# Patient Record
Sex: Male | Born: 1950 | Race: White | Hispanic: No | Marital: Married | Smoking: Never smoker
Health system: Southern US, Community
[De-identification: ages and names within clinical notes are randomized; demographics above are authoritative.]

## PROBLEM LIST (undated history)

## (undated) DIAGNOSIS — I219 Acute myocardial infarction, unspecified: Secondary | ICD-10-CM

## (undated) DIAGNOSIS — E785 Hyperlipidemia, unspecified: Secondary | ICD-10-CM

## (undated) DIAGNOSIS — I639 Cerebral infarction, unspecified: Secondary | ICD-10-CM

## (undated) DIAGNOSIS — E119 Type 2 diabetes mellitus without complications: Secondary | ICD-10-CM

## (undated) HISTORY — PX: CORONARY STENT INTERVENTION: CATH118234

---

## 1988-02-04 DIAGNOSIS — I219 Acute myocardial infarction, unspecified: Secondary | ICD-10-CM

## 1988-02-04 HISTORY — DX: Acute myocardial infarction, unspecified: I21.9

## 2019-02-04 DIAGNOSIS — I639 Cerebral infarction, unspecified: Secondary | ICD-10-CM

## 2019-02-04 HISTORY — DX: Cerebral infarction, unspecified: I63.9

## 2019-08-26 ENCOUNTER — Emergency Department (HOSPITAL_COMMUNITY): Payer: Medicare HMO

## 2019-08-26 ENCOUNTER — Other Ambulatory Visit: Payer: Self-pay

## 2019-08-26 ENCOUNTER — Inpatient Hospital Stay (HOSPITAL_COMMUNITY)
Admission: EM | Admit: 2019-08-26 | Discharge: 2019-09-03 | DRG: 023 | Disposition: A | Discharge status: Home Health | Payer: Medicare HMO | Attending: Internal Medicine | Admitting: Internal Medicine

## 2019-08-26 ENCOUNTER — Encounter (HOSPITAL_COMMUNITY): Payer: Self-pay

## 2019-08-26 DIAGNOSIS — E119 Type 2 diabetes mellitus without complications: Secondary | ICD-10-CM

## 2019-08-26 DIAGNOSIS — Z515 Encounter for palliative care: Secondary | ICD-10-CM

## 2019-08-26 DIAGNOSIS — I639 Cerebral infarction, unspecified: Secondary | ICD-10-CM

## 2019-08-26 DIAGNOSIS — E1165 Type 2 diabetes mellitus with hyperglycemia: Secondary | ICD-10-CM | POA: Diagnosis present

## 2019-08-26 DIAGNOSIS — K5900 Constipation, unspecified: Secondary | ICD-10-CM | POA: Diagnosis not present

## 2019-08-26 DIAGNOSIS — I63219 Cerebral infarction due to unspecified occlusion or stenosis of unspecified vertebral arteries: Secondary | ICD-10-CM

## 2019-08-26 DIAGNOSIS — Z7902 Long term (current) use of antithrombotics/antiplatelets: Secondary | ICD-10-CM

## 2019-08-26 DIAGNOSIS — I651 Occlusion and stenosis of basilar artery: Secondary | ICD-10-CM

## 2019-08-26 DIAGNOSIS — I63332 Cerebral infarction due to thrombosis of left posterior cerebral artery: Secondary | ICD-10-CM | POA: Diagnosis not present

## 2019-08-26 DIAGNOSIS — E785 Hyperlipidemia, unspecified: Secondary | ICD-10-CM | POA: Diagnosis not present

## 2019-08-26 DIAGNOSIS — R4781 Slurred speech: Secondary | ICD-10-CM | POA: Diagnosis present

## 2019-08-26 DIAGNOSIS — E118 Type 2 diabetes mellitus with unspecified complications: Secondary | ICD-10-CM

## 2019-08-26 DIAGNOSIS — E1122 Type 2 diabetes mellitus with diabetic chronic kidney disease: Secondary | ICD-10-CM | POA: Diagnosis present

## 2019-08-26 DIAGNOSIS — I63213 Cerebral infarction due to unspecified occlusion or stenosis of bilateral vertebral arteries: Secondary | ICD-10-CM | POA: Diagnosis not present

## 2019-08-26 DIAGNOSIS — I2510 Atherosclerotic heart disease of native coronary artery without angina pectoris: Secondary | ICD-10-CM | POA: Diagnosis present

## 2019-08-26 DIAGNOSIS — Z955 Presence of coronary angioplasty implant and graft: Secondary | ICD-10-CM

## 2019-08-26 DIAGNOSIS — N183 Chronic kidney disease, stage 3 unspecified: Secondary | ICD-10-CM | POA: Diagnosis present

## 2019-08-26 DIAGNOSIS — I6602 Occlusion and stenosis of left middle cerebral artery: Secondary | ICD-10-CM | POA: Diagnosis present

## 2019-08-26 DIAGNOSIS — G8321 Monoplegia of upper limb affecting right dominant side: Secondary | ICD-10-CM | POA: Diagnosis present

## 2019-08-26 DIAGNOSIS — Z79899 Other long term (current) drug therapy: Secondary | ICD-10-CM

## 2019-08-26 DIAGNOSIS — I252 Old myocardial infarction: Secondary | ICD-10-CM

## 2019-08-26 DIAGNOSIS — R29705 NIHSS score 5: Secondary | ICD-10-CM | POA: Diagnosis present

## 2019-08-26 DIAGNOSIS — N1831 Chronic kidney disease, stage 3a: Secondary | ICD-10-CM | POA: Diagnosis not present

## 2019-08-26 DIAGNOSIS — Z781 Physical restraint status: Secondary | ICD-10-CM

## 2019-08-26 DIAGNOSIS — R4182 Altered mental status, unspecified: Secondary | ICD-10-CM | POA: Diagnosis present

## 2019-08-26 DIAGNOSIS — Z6834 Body mass index (BMI) 34.0-34.9, adult: Secondary | ICD-10-CM

## 2019-08-26 DIAGNOSIS — Z7189 Other specified counseling: Secondary | ICD-10-CM

## 2019-08-26 DIAGNOSIS — E669 Obesity, unspecified: Secondary | ICD-10-CM | POA: Diagnosis present

## 2019-08-26 DIAGNOSIS — R339 Retention of urine, unspecified: Secondary | ICD-10-CM | POA: Diagnosis not present

## 2019-08-26 DIAGNOSIS — E876 Hypokalemia: Secondary | ICD-10-CM | POA: Diagnosis present

## 2019-08-26 DIAGNOSIS — G9341 Metabolic encephalopathy: Secondary | ICD-10-CM | POA: Diagnosis not present

## 2019-08-26 DIAGNOSIS — I129 Hypertensive chronic kidney disease with stage 1 through stage 4 chronic kidney disease, or unspecified chronic kidney disease: Secondary | ICD-10-CM | POA: Diagnosis present

## 2019-08-26 DIAGNOSIS — I6322 Cerebral infarction due to unspecified occlusion or stenosis of basilar arteries: Secondary | ICD-10-CM

## 2019-08-26 DIAGNOSIS — R471 Dysarthria and anarthria: Secondary | ICD-10-CM

## 2019-08-26 DIAGNOSIS — I679 Cerebrovascular disease, unspecified: Secondary | ICD-10-CM | POA: Diagnosis present

## 2019-08-26 DIAGNOSIS — I69398 Other sequelae of cerebral infarction: Secondary | ICD-10-CM

## 2019-08-26 DIAGNOSIS — Z20822 Contact with and (suspected) exposure to covid-19: Secondary | ICD-10-CM | POA: Diagnosis present

## 2019-08-26 DIAGNOSIS — R262 Difficulty in walking, not elsewhere classified: Secondary | ICD-10-CM | POA: Diagnosis present

## 2019-08-26 DIAGNOSIS — H538 Other visual disturbances: Secondary | ICD-10-CM | POA: Diagnosis present

## 2019-08-26 DIAGNOSIS — R319 Hematuria, unspecified: Secondary | ICD-10-CM | POA: Diagnosis not present

## 2019-08-26 HISTORY — DX: Hyperlipidemia, unspecified: E78.5

## 2019-08-26 HISTORY — DX: Acute myocardial infarction, unspecified: I21.9

## 2019-08-26 HISTORY — DX: Cerebral infarction, unspecified: I63.9

## 2019-08-26 LAB — DIFFERENTIAL
Abs Immature Granulocytes: 0.02 10*3/uL (ref 0.00–0.07)
Basophils Absolute: 0 10*3/uL (ref 0.0–0.1)
Basophils Relative: 0 %
Eosinophils Absolute: 0.4 10*3/uL (ref 0.0–0.5)
Eosinophils Relative: 5 %
Immature Granulocytes: 0 %
Lymphocytes Relative: 15 %
Lymphs Abs: 1.2 10*3/uL (ref 0.7–4.0)
Monocytes Absolute: 0.9 10*3/uL (ref 0.1–1.0)
Monocytes Relative: 11 %
Neutro Abs: 5.4 10*3/uL (ref 1.7–7.7)
Neutrophils Relative %: 69 %

## 2019-08-26 LAB — CBC
HCT: 40.9 % (ref 39.0–52.0)
HCT: 42.2 % (ref 39.0–52.0)
Hemoglobin: 14 g/dL (ref 13.0–17.0)
Hemoglobin: 14.4 g/dL (ref 13.0–17.0)
MCH: 32.6 pg (ref 26.0–34.0)
MCH: 32.4 pg (ref 26.0–34.0)
MCHC: 34.2 g/dL (ref 30.0–36.0)
MCHC: 34.1 g/dL (ref 30.0–36.0)
MCV: 95.1 fL (ref 80.0–100.0)
MCV: 94.8 fL (ref 80.0–100.0)
Platelets: 281 10*3/uL (ref 150–400)
Platelets: 278 10*3/uL (ref 150–400)
RBC: 4.3 MIL/uL (ref 4.22–5.81)
RBC: 4.45 MIL/uL (ref 4.22–5.81)
RDW: 13.4 % (ref 11.5–15.5)
RDW: 13.3 % (ref 11.5–15.5)
WBC: 7.9 10*3/uL (ref 4.0–10.5)
WBC: 6.9 10*3/uL (ref 4.0–10.5)
nRBC: 0 % (ref 0.0–0.2)
nRBC: 0 % (ref 0.0–0.2)

## 2019-08-26 LAB — COMPREHENSIVE METABOLIC PANEL
ALT: 22 U/L (ref 0–44)
AST: 23 U/L (ref 15–41)
Albumin: 3.4 g/dL — ABNORMAL LOW (ref 3.5–5.0)
Alkaline Phosphatase: 106 U/L (ref 38–126)
Anion gap: 10 (ref 5–15)
BUN: 16 mg/dL (ref 8–23)
CO2: 25 mmol/L (ref 22–32)
Calcium: 9.4 mg/dL (ref 8.9–10.3)
Chloride: 103 mmol/L (ref 98–111)
Creatinine, Ser: 1.56 mg/dL — ABNORMAL HIGH (ref 0.61–1.24)
GFR calc Af Amer: 52 mL/min — ABNORMAL LOW (ref >60)
GFR calc non Af Amer: 45 mL/min — ABNORMAL LOW (ref >60)
Glucose, Bld: 163 mg/dL — ABNORMAL HIGH (ref 70–99)
Potassium: 3.7 mmol/L (ref 3.5–5.1)
Sodium: 138 mmol/L (ref 135–145)
Total Bilirubin: 0.8 mg/dL (ref 0.3–1.2)
Total Protein: 7.4 g/dL (ref 6.5–8.1)

## 2019-08-26 LAB — SARS CORONAVIRUS 2 BY RT PCR (HOSPITAL ORDER, PERFORMED IN ~~LOC~~ HOSPITAL LAB): SARS Coronavirus 2: NEGATIVE

## 2019-08-26 LAB — HIV ANTIBODY (ROUTINE TESTING W REFLEX): HIV Screen 4th Generation wRfx: NONREACTIVE

## 2019-08-26 LAB — I-STAT CHEM 8, ED
BUN: 18 mg/dL (ref 8–23)
Calcium, Ion: 1.27 mmol/L (ref 1.15–1.40)
Chloride: 102 mmol/L (ref 98–111)
Creatinine, Ser: 1.6 mg/dL — ABNORMAL HIGH (ref 0.61–1.24)
Glucose, Bld: 160 mg/dL — ABNORMAL HIGH (ref 70–99)
HCT: 40 % (ref 39.0–52.0)
Hemoglobin: 13.6 g/dL (ref 13.0–17.0)
Potassium: 3.6 mmol/L (ref 3.5–5.1)
Sodium: 142 mmol/L (ref 135–145)
TCO2: 26 mmol/L (ref 22–32)

## 2019-08-26 LAB — CBG MONITORING, ED: Glucose-Capillary: 116 mg/dL — ABNORMAL HIGH (ref 70–99)

## 2019-08-26 LAB — CREATININE, SERUM
Creatinine, Ser: 1.47 mg/dL — ABNORMAL HIGH (ref 0.61–1.24)
GFR calc Af Amer: 56 mL/min — ABNORMAL LOW (ref >60)
GFR calc non Af Amer: 48 mL/min — ABNORMAL LOW (ref >60)

## 2019-08-26 LAB — APTT: aPTT: 28 seconds (ref 24–36)

## 2019-08-26 LAB — PROTIME-INR
INR: 1 (ref 0.8–1.2)
Prothrombin Time: 12.7 seconds (ref 11.4–15.2)

## 2019-08-26 IMAGING — MR MR HEAD W/O CM
7 of 11 series · 26 of 48 positions shown · non-contrast
Comparison: [DATE] head CT.  [DATE] MRI head.

CLINICAL DATA: Neuro deficit.

EXAM:
MRI HEAD WITHOUT CONTRAST
TECHNIQUE: Multiplanar, multiecho pulse sequences of the brain and surrounding
structures were obtained without intravenous contrast.

[Series 2: DWI · axial · 3.0mm · 0.98mm/px · z∈[-65,+88]mm · 7 of 104 slices shown (1 of 2)]
[im 1/104]
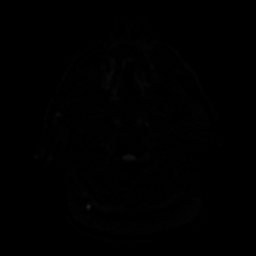
[im 18/104]
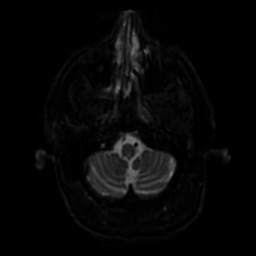
[im 35/104]
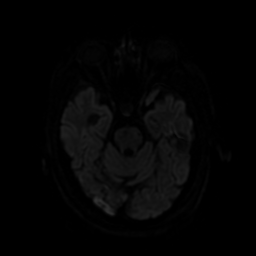
[im 52/104]
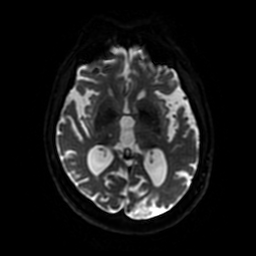
[im 69/104]
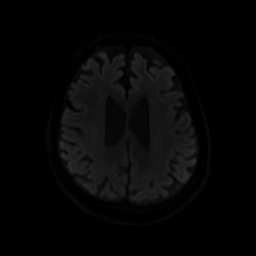
[im 86/104]
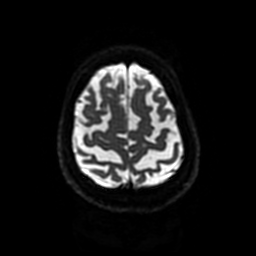
[im 104/104]
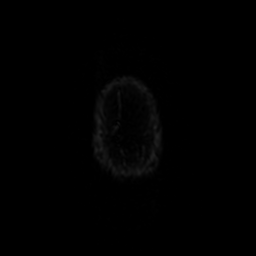

[Series 3: DWI · coronal · 4.0mm · 0.94mm/px · 6 of 74 slices shown (2 of 2)]
[im 1/74]
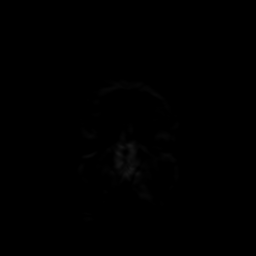
[im 15/74]
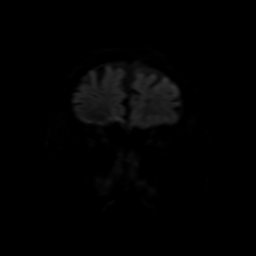
[im 30/74]
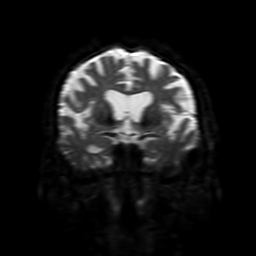
[im 44/74]
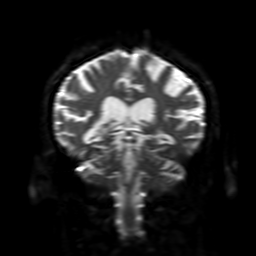
[im 59/74]
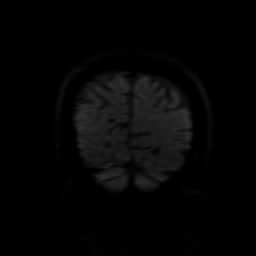
[im 74/74]
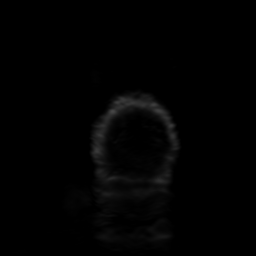

[Series 4: FLAIR · axial · 3.0mm · 0.45mm/px · z∈[-63,+86]mm · 2 of 26 slices shown (1 of 2)]
[im 1/26]
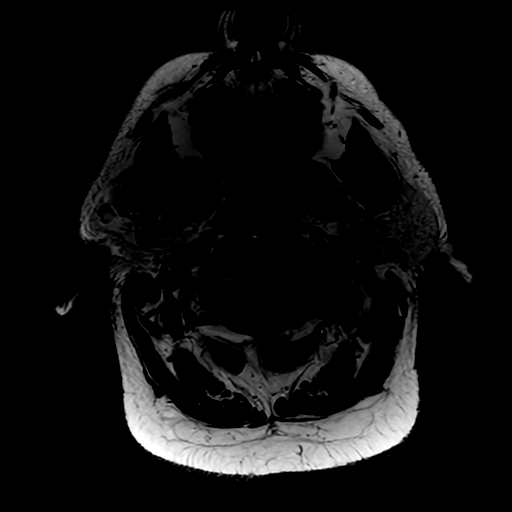
[im 26/26]
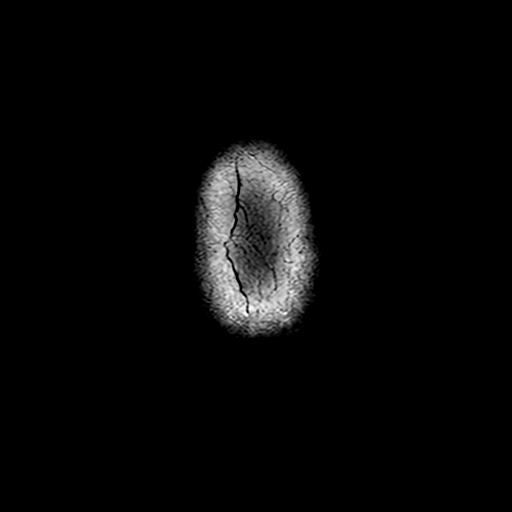

[Series 6: FLAIR · sagittal · 5.0mm · 0.23mm/px · 2 of 25 slices shown (2 of 2)]
[im 1/25]
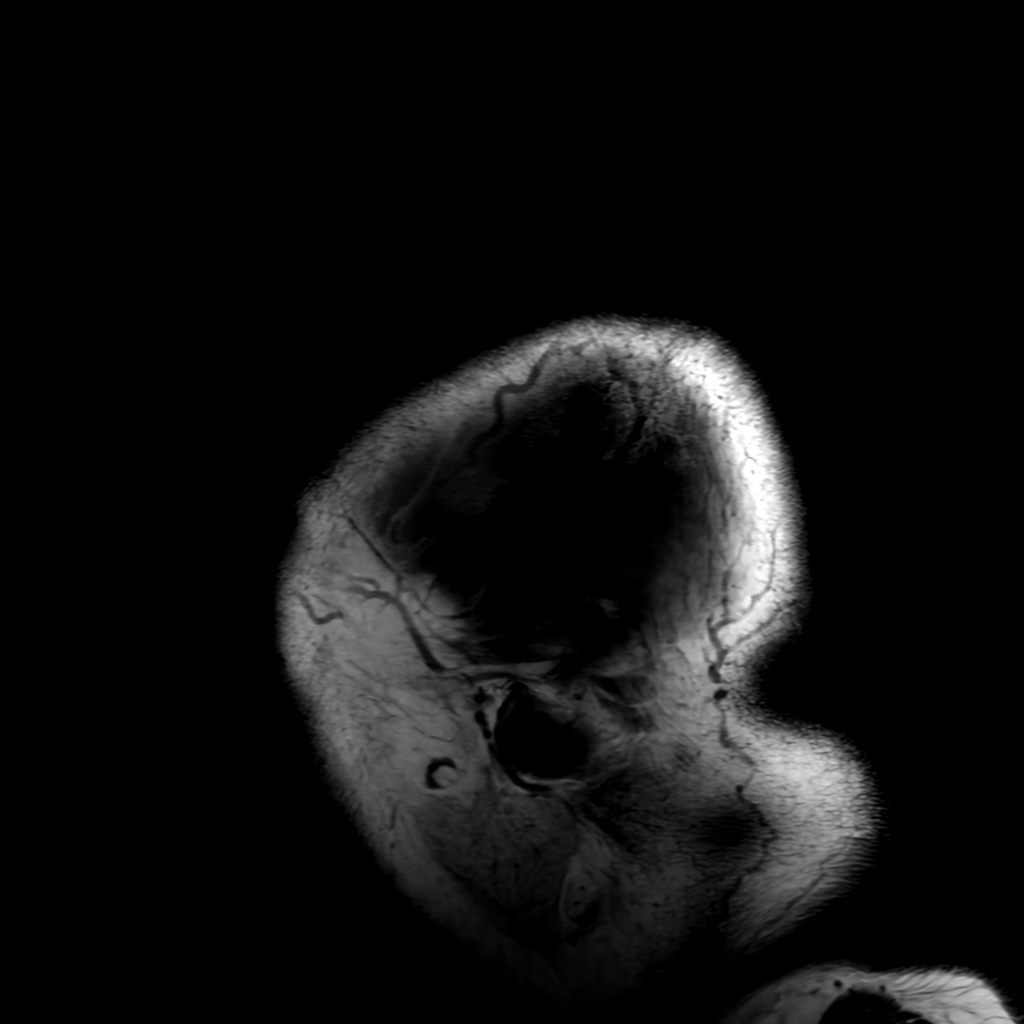
[im 25/25]
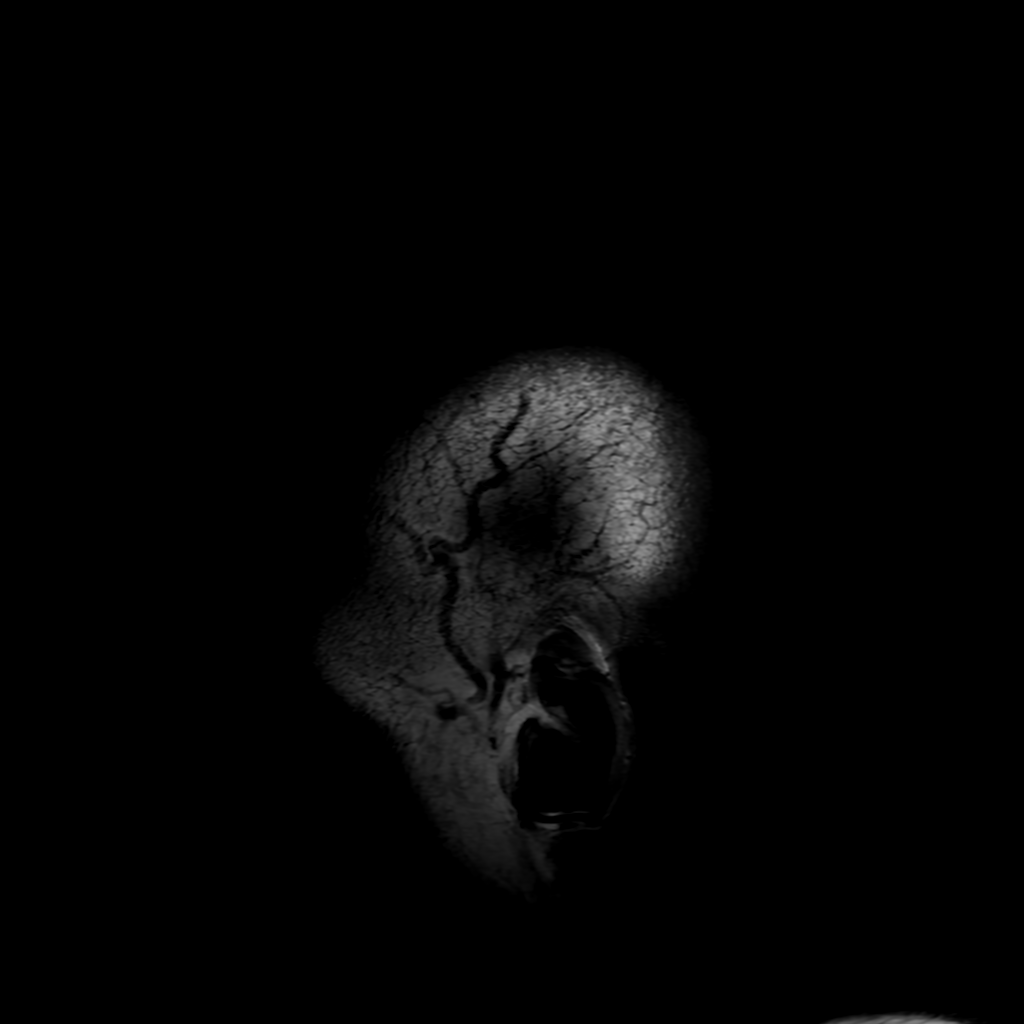

[Series 7: T2 · axial · 5.0mm · 0.47mm/px · z∈[-64,+86]mm · 2 of 26 slices shown]
[im 1/26]
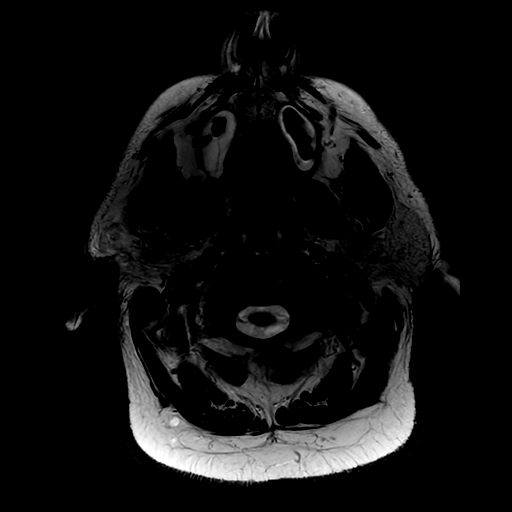
[im 26/26]
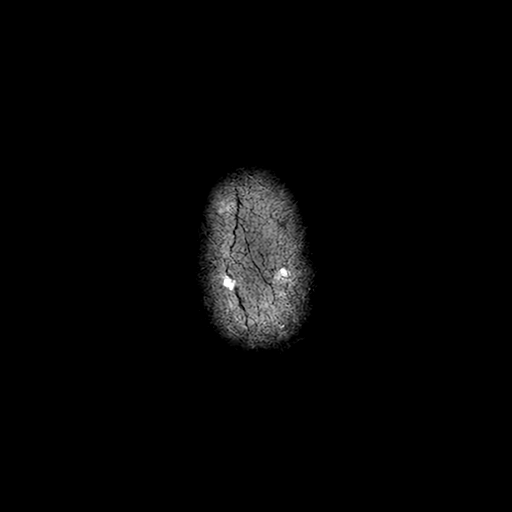

[Series 250: ADC · axial · 3.0mm · 0.98mm/px · z∈[-65,+88]mm · 4 of 52 slices shown (1 of 2)]
[im 1/52]
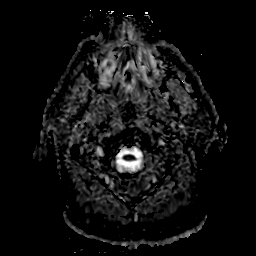
[im 18/52]
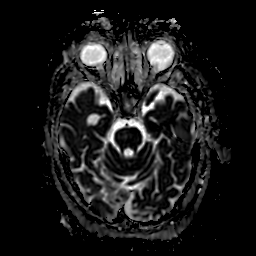
[im 35/52]
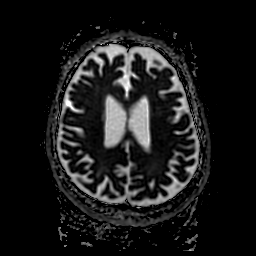
[im 52/52]
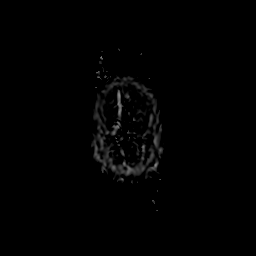

[Series 350: ADC · coronal · 4.0mm · 0.94mm/px · 3 of 37 slices shown (2 of 2)]
[im 1/37]
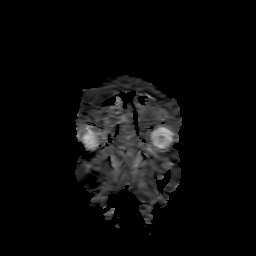
[im 19/37]
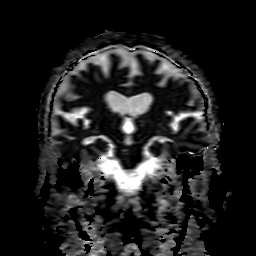
[im 37/37]
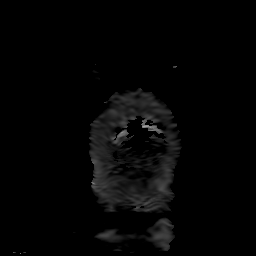

[26 of 48 positions shown; findings below may reference images not displayed]

FINDINGS: Brain: Multifocal restricted diffusion and correlative T2
hyperintense signal involving the bilateral occipital lobes and
anterior left pons. Tiny remote microhemorrhages involving the
bilateral cerebellum.

Moderate diffuse parenchymal volume loss. Sequela of remote
bilateral occipital and cerebellar infarcts. Smaller remote infarcts
are seen involving the dorsal left midbrain, bilateral centrum
semiovale, bilateral thalami and left frontal white matter.

No midline shift, mass lesion or extra-axial fluid collection.

Vascular: FLAIR hyperintense signal within the right V4 segment may
reflect slow flow versus occlusion, unchanged from prior exam.

Skull and upper cervical spine: Normal marrow signal.

Sinuses/Orbits: Normal orbits. Mild-to-moderate pansinus mucosal
thickening with layering bilateral maxillary secretions. Trace left
mastoid effusion.

Other: None.
IMPRESSION: Acute infarcts involving the bilateral occipital lobes and anterior
left pons.

Remote infarcts involving the bilateral occipital lobes, bilateral
cerebellum, dorsal left midbrain, bilateral centrum semiovale,
bilateral thalami and left frontal white matter.

Slow flow versus chronic thrombosis of the right V4 segment,
unchanged.

These results were called by telephone at the time of interpretation
on [DATE] at [DATE] to provider Dr. ABOT, who verbally
acknowledged these results.

## 2019-08-26 IMAGING — CT CT HEAD W/O CM
4 series · 16 of 47 positions shown, 18 images · non-contrast
Comparison: MRI of the brain [DATE]

CLINICAL DATA: Neuro deficit.

EXAM:
CT HEAD WITHOUT CONTRAST
TECHNIQUE: Contiguous axial images were obtained from the base of the skull
through the vertex without intravenous contrast.

[Series 3: head without · axial · non-contrast · 0.51mm/px · z∈[+1752,+1872]mm · 7 of 32 slices shown, 9 images]
[im 4/32  brain]
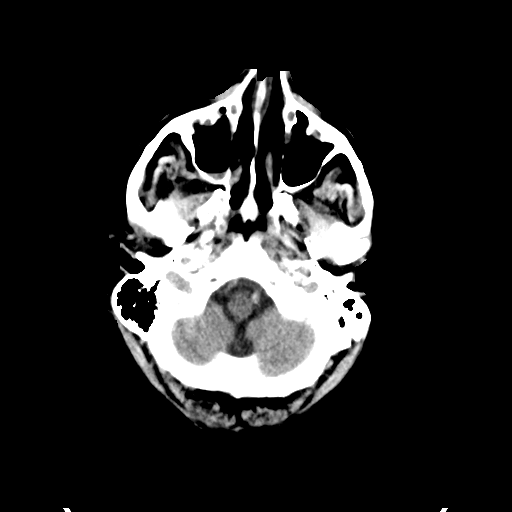
[im 4/32  bone]
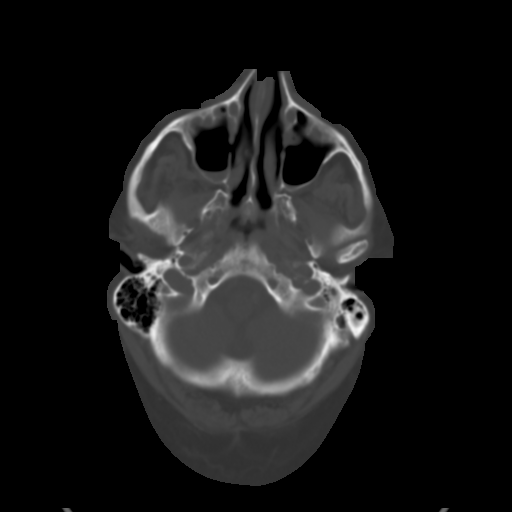
[im 8/32  brain]
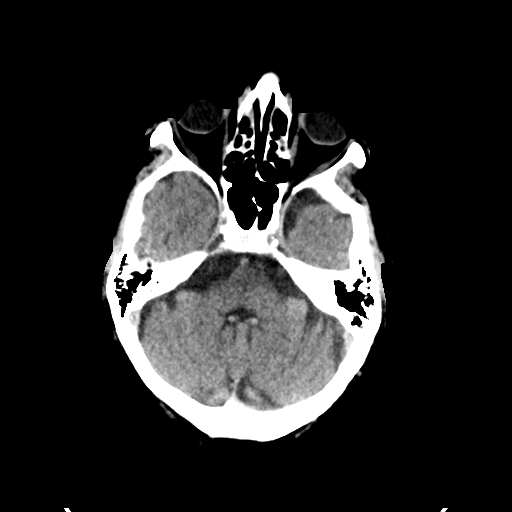
[im 12/32  brain]
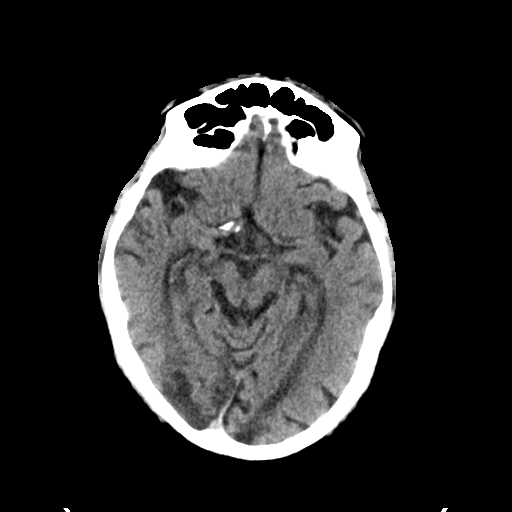
[im 16/32  brain]
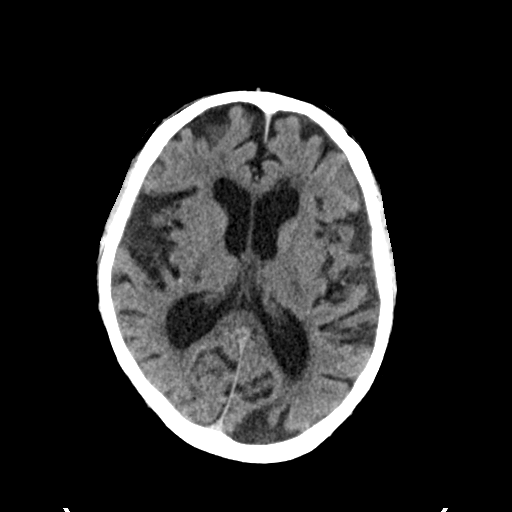
[im 20/32  brain]
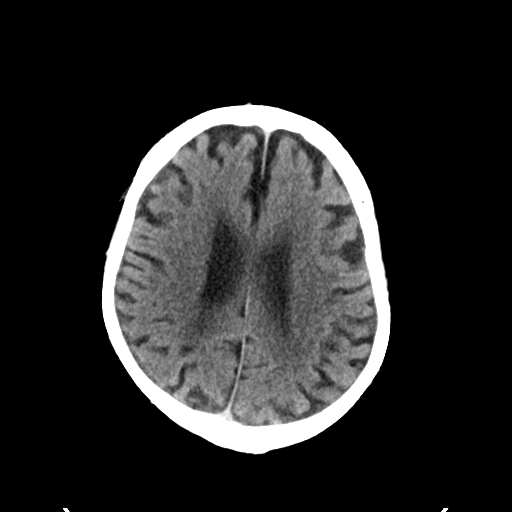
[im 20/32  bone]
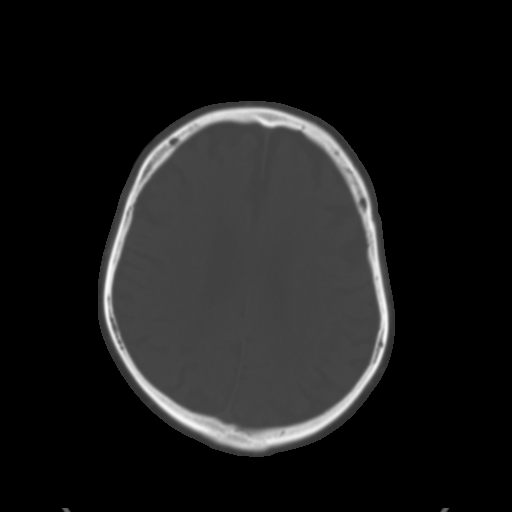
[im 24/32  brain]
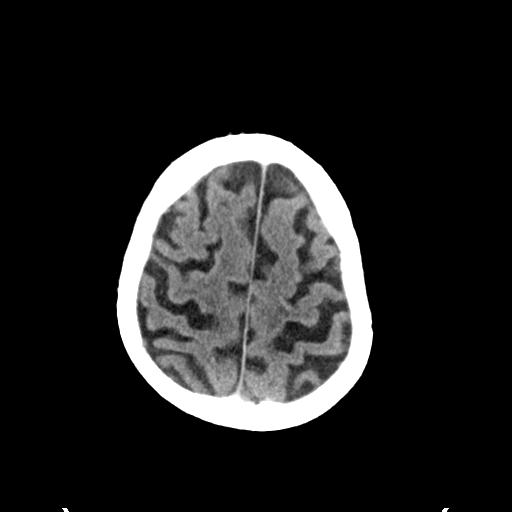
[im 28/32  brain]
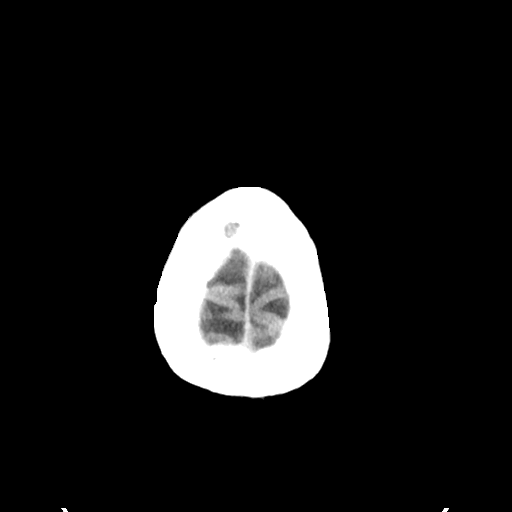

[Series 4: head bone · axial · 0.51mm/px · z∈[+1751,+1783]mm · 3 of 80 slices shown]
[im 8/80  bone]
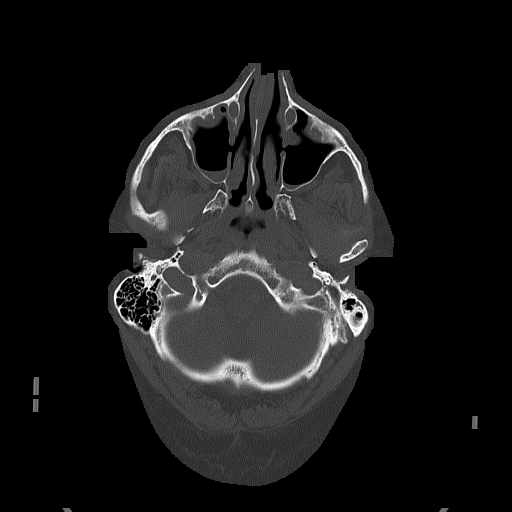
[im 16/80  bone]
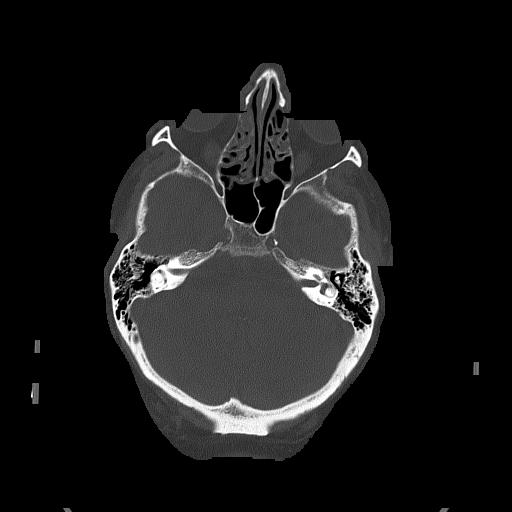
[im 24/80  bone]
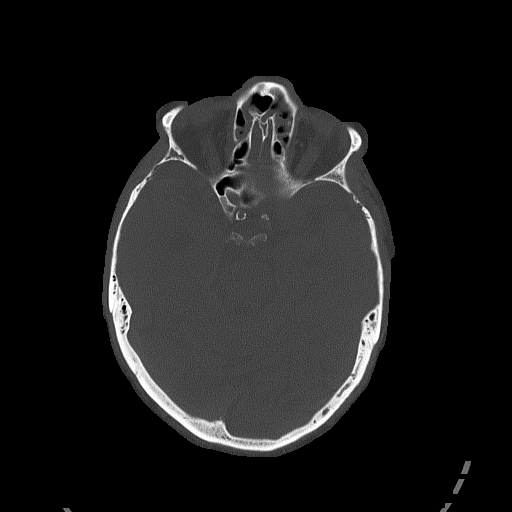

[Series 5: head without cor · coronal · non-contrast · 0.33mm/px · 3 of 73 slices shown]
[im 25/73  brain]
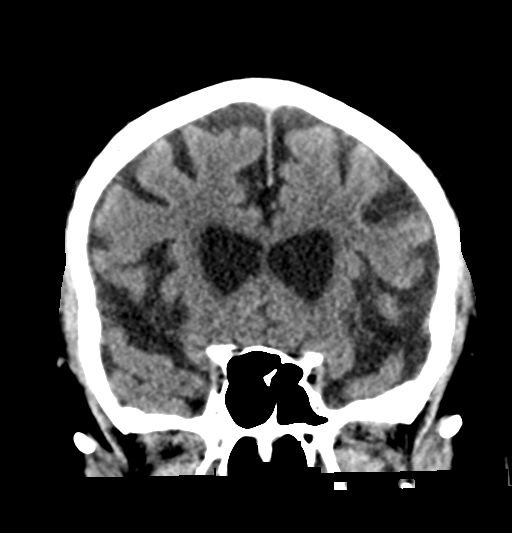
[im 33/73  brain]
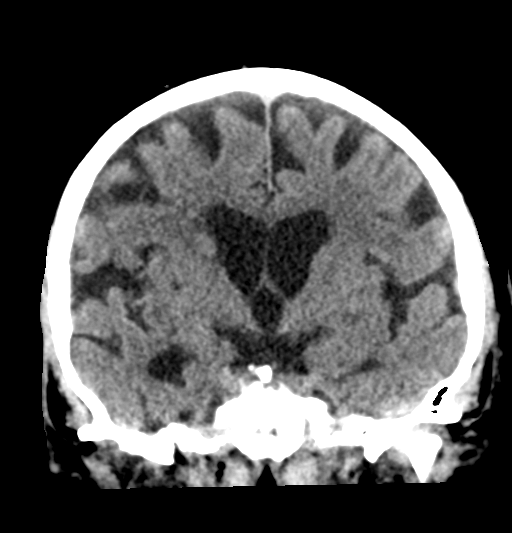
[im 41/73  brain]
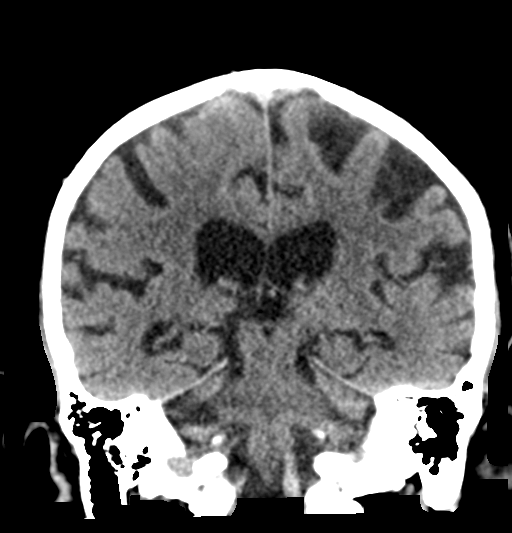

[Series 6: head without sag · sagittal · non-contrast · 0.33mm/px · 3 of 67 slices shown]
[im 23/67  brain]
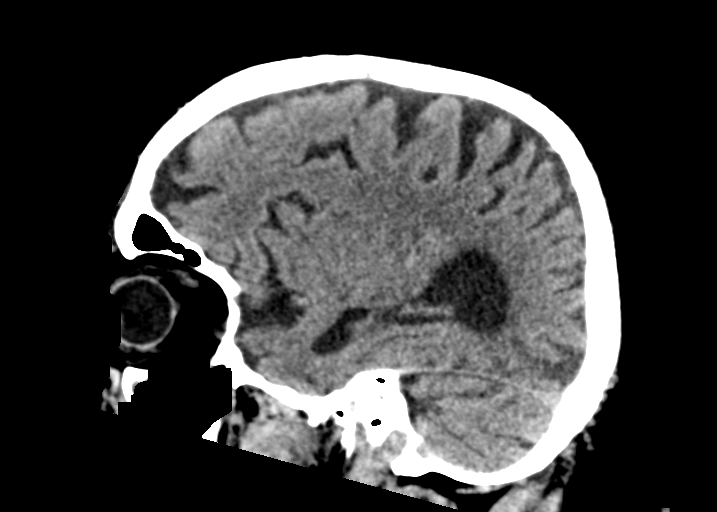
[im 34/67  brain]
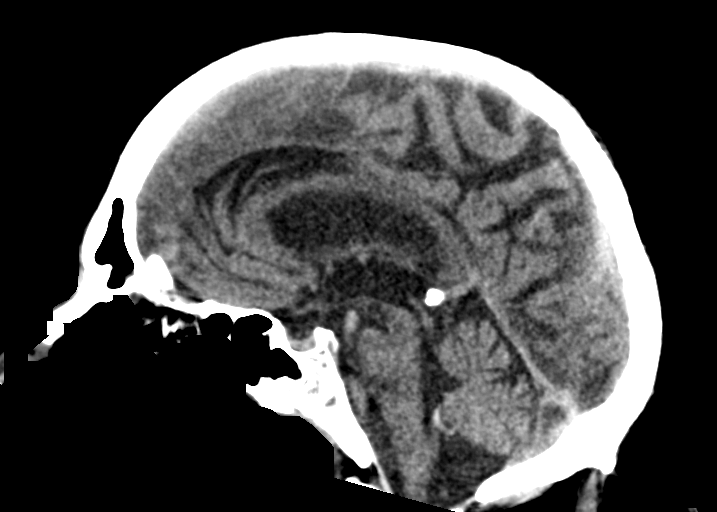
[im 45/67  brain]
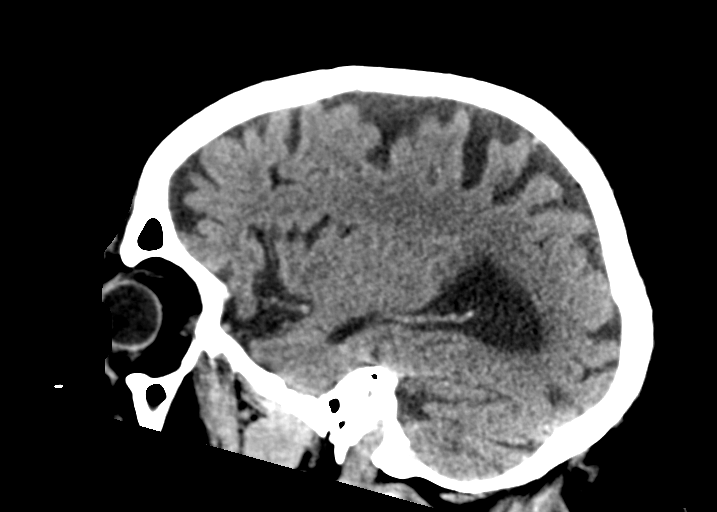

[16 of 47 positions shown; findings below may reference images not displayed]

FINDINGS: Brain: No evidence of acute infarction, hemorrhage, hydrocephalus,
extra-axial collection or mass lesion/mass effect. Infarcts
exhibited recent MRI not well seen on today's study with evidence of
remote infarct in the bilateral occipital poles but without signs of
acute intracranial process.

Vascular: No hyperdense vessel or unexpected calcification.

Skull: Normal. Negative for fracture or focal lesion.

Sinuses/Orbits: Bilateral maxillary sinus disease, new since
previous imaging worse on the RIGHT.

Other: None.
IMPRESSION: 1. No acute intracranial abnormality. Signs of prior infarct,
atrophy and chronic microvascular ischemic change.
2. Infarcts exhibited recent MRI not well seen on today's study with
evidence of remote infarct in the bilateral occipital poles but
without signs of acute intracranial process.
3. Bilateral maxillary sinus disease, new since previous imaging
worse on the RIGHT.

## 2019-08-26 MED ORDER — HEPARIN SODIUM (PORCINE) 5000 UNIT/ML IJ SOLN
5000.0000 [IU] | Freq: Three times a day (TID) | INTRAMUSCULAR | Status: AC
  Administered 2019-08-26 – 2019-08-31 (×13): 5000 [IU] via SUBCUTANEOUS
  Filled 2019-08-26 (×14): qty 1

## 2019-08-26 MED ORDER — AMLODIPINE BESYLATE 2.5 MG PO TABS: 2.5000 mg | ORAL_TABLET | Freq: Every day | ORAL | Status: DC

## 2019-08-26 MED ORDER — ACETAMINOPHEN 650 MG RE SUPP: 650.0000 mg | RECTAL | Status: DC | PRN

## 2019-08-26 MED ORDER — ACETAMINOPHEN 325 MG PO TABS: 650.0000 mg | ORAL_TABLET | ORAL | Status: DC | PRN

## 2019-08-26 MED ORDER — CLOPIDOGREL BISULFATE 75 MG PO TABS
75.0000 mg | ORAL_TABLET | Freq: Every day | ORAL | Status: DC
  Administered 2019-08-27 – 2019-08-28 (×2): 75 mg via ORAL
  Filled 2019-08-26 (×2): qty 1

## 2019-08-26 MED ORDER — SODIUM CHLORIDE 0.9% FLUSH: 3.0000 mL | Freq: Once | INTRAVENOUS | Status: DC

## 2019-08-26 MED ORDER — FLUOXETINE HCL 10 MG PO CAPS
10.0000 mg | ORAL_CAPSULE | Freq: Every day | ORAL | Status: DC
  Administered 2019-08-27 – 2019-09-03 (×6): 10 mg via ORAL
  Filled 2019-08-26 (×6): qty 1

## 2019-08-26 MED ORDER — SENNOSIDES-DOCUSATE SODIUM 8.6-50 MG PO TABS
1.0000 | ORAL_TABLET | Freq: Every evening | ORAL | Status: DC | PRN
  Administered 2019-08-31: 1 via ORAL
  Filled 2019-08-26: qty 1

## 2019-08-26 MED ORDER — CARVEDILOL 3.125 MG PO TABS
3.1250 mg | ORAL_TABLET | Freq: Two times a day (BID) | ORAL | Status: DC
  Administered 2019-08-27: 3.125 mg via ORAL
  Filled 2019-08-26: qty 1

## 2019-08-26 MED ORDER — ACETAMINOPHEN 160 MG/5ML PO SOLN: 650.0000 mg | ORAL | Status: DC | PRN

## 2019-08-26 MED ORDER — SODIUM CHLORIDE 0.9 % IV SOLN: INTRAVENOUS | Status: DC

## 2019-08-26 MED ORDER — ADULT MULTIVITAMIN W/MINERALS CH
1.0000 | ORAL_TABLET | Freq: Every day | ORAL | Status: DC
  Administered 2019-08-27 – 2019-09-03 (×5): 1 via ORAL
  Filled 2019-08-26 (×6): qty 1

## 2019-08-26 MED ORDER — STROKE: EARLY STAGES OF RECOVERY BOOK
Freq: Once | Status: AC
  Filled 2019-08-26: qty 1

## 2019-08-26 MED ORDER — ATORVASTATIN CALCIUM 80 MG PO TABS
80.0000 mg | ORAL_TABLET | Freq: Every day | ORAL | Status: DC
  Administered 2019-08-27 – 2019-09-02 (×6): 80 mg via ORAL
  Filled 2019-08-26 (×6): qty 1

## 2019-08-26 MED ORDER — FEBUXOSTAT 40 MG PO TABS
40.0000 mg | ORAL_TABLET | Freq: Every day | ORAL | Status: DC
  Administered 2019-08-27 – 2019-09-03 (×7): 40 mg via ORAL
  Filled 2019-08-26 (×7): qty 1

## 2019-08-26 NOTE — ED Notes (Signed)
Son spoke to this tech concerned about his dad possibly having another stroke. Son stated that no doctors will see pt due to him not having a MRI. Pt is unable to answer question appropriately to this tech. Wife is with pt. Son is waiting outside. Family has emphazied on wanting pt to have MRI.

## 2019-08-26 NOTE — ED Provider Notes (Signed)
Care assumed from [facility], [place], at shift change, please see their notes for full documentation of patient's complaint/HPI. Briefly, pt here with complaint of worsening stroke symptoms including worsening dysarthria and gait imbalance since 07/18. Pt recently discharged from rehab on 07/14 after recurrent stroke earlier in June. Went to [facility] for the weekend when wife began noticing worsening symptoms. Went to ED at [facility] and had CT scan done without acute findings however staff wanted to admit patient for MRI however pt declined. Awaiting MRI at this time. Plan is to compare to previous MRI in June and to discuss with neurologist if acute findings.   Physical Exam  BP (!) 140/83   Pulse 69   Temp 99.4 F (37.4 C) (Oral)   Resp 16   SpO2 97%   Physical Exam Vitals and nursing note reviewed.  Constitutional:      Appearance: He is obese. He is not ill-appearing.  HENT:     Head: Normocephalic and atraumatic.  Eyes:     Conjunctiva/sclera: Conjunctivae normal.  Cardiovascular:     Rate and Rhythm: Normal rate and regular rhythm.  Pulmonary:     Effort: Pulmonary effort is normal.     Breath sounds: Normal breath sounds.  Skin:    General: Skin is warm and dry.     Coloration: Skin is not jaundiced.  Neurological:     Mental Status: He is alert.     Cranial Nerves: Dysarthria present.     Comments: Alert to person, place, and situation. Does know that the year is 2021 however believes the president is Earl Lites.  Dysarthria noted on exam.  Slight 4/5 RUE weakness with grip strength. 5/5 strength to RLE as well as LUE/LLE.  Normal finger to note.  Negative pronator drift.      ED Course/Procedures   Clinical Course as of Aug 26 1727  Fri Aug 26, 2019  1640 SARS Coronavirus 2: NEGATIVE [MV]    Clinical Course User Index [MV] Tanda Rockers, PA-C    Procedures  Results for orders placed or performed during the hospital encounter of 08/26/19  SARS  Coronavirus 2 by RT PCR (hospital order, performed in [facility] hospital lab) Nasopharyngeal Nasopharyngeal Swab   Specimen: Nasopharyngeal Swab  Result Value Ref Range   SARS Coronavirus 2 NEGATIVE NEGATIVE  Protime-INR  Result Value Ref Range   Prothrombin Time 12.7 11.4 - 15.2 seconds   INR 1.0 0.8 - 1.2  APTT  Result Value Ref Range   aPTT 28 24 - 36 seconds  CBC  Result Value Ref Range   WBC 7.9 4.0 - 10.5 K/uL   RBC 4.30 4.22 - 5.81 MIL/uL   Hemoglobin 14.0 13.0 - 17.0 g/dL   HCT 66.4 39 - 52 %   MCV 95.1 80.0 - 100.0 fL   MCH 32.6 26.0 - 34.0 pg   MCHC 34.2 30.0 - 36.0 g/dL   RDW 40.3 47.4 - 25.9 %   Platelets 281 150 - 400 K/uL   nRBC 0.0 0.0 - 0.2 %  Differential  Result Value Ref Range   Neutrophils Relative % 69 %   Neutro Abs 5.4 1.7 - 7.7 K/uL   Lymphocytes Relative 15 %   Lymphs Abs 1.2 0.7 - 4.0 K/uL   Monocytes Relative 11 %   Monocytes Absolute 0.9 0 - 1 K/uL   Eosinophils Relative 5 %   Eosinophils Absolute 0.4 0 - 0 K/uL   Basophils Relative 0 %  Basophils Absolute 0.0 0 - 0 K/uL   Immature Granulocytes 0 %   Abs Immature Granulocytes 0.02 0.00 - 0.07 K/uL  Comprehensive metabolic panel  Result Value Ref Range   Sodium 138 135 - 145 mmol/L   Potassium 3.7 3.5 - 5.1 mmol/L   Chloride 103 98 - 111 mmol/L   CO2 25 22 - 32 mmol/L   Glucose, Bld 163 (H) 70 - 99 mg/dL   BUN 16 8 - 23 mg/dL   Creatinine, Ser 4.16 (H) 0.61 - 1.24 mg/dL   Calcium 9.4 8.9 - 60.6 mg/dL   Total Protein 7.4 6.5 - 8.1 g/dL   Albumin 3.4 (L) 3.5 - 5.0 g/dL   AST 23 15 - 41 U/L   ALT 22 0 - 44 U/L   Alkaline Phosphatase 106 38 - 126 U/L   Total Bilirubin 0.8 0.3 - 1.2 mg/dL   GFR calc non Af Amer 45 (L) >60 mL/min   GFR calc Af Amer 52 (L) >60 mL/min   Anion gap 10 5 - 15  I-stat chem 8, ED  Result Value Ref Range   Sodium 142 135 - 145 mmol/L   Potassium 3.6 3.5 - 5.1 mmol/L   Chloride 102 98 - 111 mmol/L   BUN 18 8 - 23 mg/dL   Creatinine, Ser 3.01 (H) 0.61 - 1.24  mg/dL   Glucose, Bld 601 (H) 70 - 99 mg/dL   Calcium, Ion 0.93 2.35 - 1.40 mmol/L   TCO2 26 22 - 32 mmol/L   Hemoglobin 13.6 13.0 - 17.0 g/dL   HCT 57.3 39 - 52 %  CBG monitoring, ED  Result Value Ref Range   Glucose-Capillary 116 (H) 70 - 99 mg/dL   Comment 1 Notify RN    Comment 2 Document in Chart    CT HEAD WO CONTRAST  Result Date: 08/26/2019 CLINICAL DATA:  Neuro deficit. EXAM: CT HEAD WITHOUT CONTRAST TECHNIQUE: Contiguous axial images were obtained from the base of the skull through the vertex without intravenous contrast. COMPARISON:  MRI of the brain of August 01, 2019 FINDINGS: Brain: No evidence of acute infarction, hemorrhage, hydrocephalus, extra-axial collection or mass lesion/mass effect. Infarcts exhibited recent MRI not well seen on today's study with evidence of remote infarct in the bilateral occipital poles but without signs of acute intracranial process. Vascular: No hyperdense vessel or unexpected calcification. Skull: Normal. Negative for fracture or focal lesion. Sinuses/Orbits: Bilateral maxillary sinus disease, new since previous imaging worse on the RIGHT. Other: None. IMPRESSION: 1. No acute intracranial abnormality. Signs of prior infarct, atrophy and chronic microvascular ischemic change. 2. Infarcts exhibited recent MRI not well seen on today's study with evidence of remote infarct in the bilateral occipital poles but without signs of acute intracranial process. 3. Bilateral maxillary sinus disease, new since previous imaging worse on the RIGHT. Electronically Signed   By: Donzetta Kohut M.D.   On: 08/26/2019 08:32   MR BRAIN WO CONTRAST  Result Date: 08/26/2019 CLINICAL DATA:  Neuro deficit. EXAM: MRI HEAD WITHOUT CONTRAST TECHNIQUE: Multiplanar, multiecho pulse sequences of the brain and surrounding structures were obtained without intravenous contrast. COMPARISON:  08/26/2019 head CT.  08/31/2019 MRI head. FINDINGS: Brain: Multifocal restricted diffusion and  correlative T2 hyperintense signal involving the bilateral occipital lobes and anterior left pons. Tiny remote microhemorrhages involving the bilateral cerebellum. Moderate diffuse parenchymal volume loss. Sequela of remote bilateral occipital and cerebellar infarcts. Smaller remote infarcts are seen involving the dorsal left midbrain, bilateral centrum semiovale, bilateral  thalami and left frontal white matter. No midline shift, mass lesion or extra-axial fluid collection. Vascular: FLAIR hyperintense signal within the right V4 segment may reflect slow flow versus occlusion, unchanged from prior exam. Skull and upper cervical spine: Normal marrow signal. Sinuses/Orbits: Normal orbits. Mild-to-moderate pansinus mucosal thickening with layering bilateral maxillary secretions. Trace left mastoid effusion. Other: None. IMPRESSION: Acute infarcts involving the bilateral occipital lobes and anterior left pons. Remote infarcts involving the bilateral occipital lobes, bilateral cerebellum, dorsal left midbrain, bilateral centrum semiovale, bilateral thalami and left frontal white matter. 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 These results were called by telephone at the time of interpretation on 08/26/2019 at 5:08 pm to provider Dr. Fredderick Phenix, who verbally acknowledged these results. Electronically Signed   By: Stana Bunting M.D.   On: 08/26/2019 17:11    MDM  Attending physician Dr. Fredderick Phenix received call from radiologist regarding acute stroke on MRI. Will consult neurologist at this time.   Discussed case with neurologist DR. Khaliqdina who agrees to evaluate patient. Will admit to medicine.   Discussed case with [facility] Dr. Gerri Lins who will evaluate patient for admission.   This note was prepared using Dragon voice recognition software and may include unintentional dictation errors due to the inherent limitations of voice recognition software.      Tanda Rockers, PA-C 08/26/19 1934    Rolan Bucco, MD 08/26/19 2014

## 2019-08-26 NOTE — ED Notes (Signed)
Pt had episode of urinary incontinence. Pt cleaned and linens changed.

## 2019-08-26 NOTE — H&P (Signed)
[name] is an 69 y.o. male.   Chief Complaint: Slurred speech, dizziness, difficulty walking on 08/21/2019 HPI: The patient is a 69 yr old man who carries a past medical history significant for CAD, history of stents, hyperlipidemia, and posterior circulation strokes. In April of 2021 the patient was diagnosed with a CVA of the PCA territory with chronic microhemorhages, as well as thalamic, brainstem, and cerebellar stroke. 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 He had 60% of the left ICA.  He was discharged on aspirin and plavix. He was readmitted to [facility] on 07/31/2019 for another posterior circulation stroke. His wife had stopped the ASA 2 weeks prior to this presentation after being told by a home health nurse that he should not be taking both ASA and Plavix. This time he was discharged to rehab on both aspiring and plavix. He was discharged to home and was doing well. He went to [facility] with his family. He again developed dysequilibrium, slurred speech, and difficulty walking. He was seen in an ED in [facility], but refused to stay because of the wait. He came here when he got home.  In the ED the patient was found to have normal vital signs. Creatinine was 1.60, and a normal CBC.  CT head demonstrated signs of prior infarct, atrophy, and chronic microvascular ischemic changes, and bilateral maxillary sinus disease.  MRI Brain demonstrated acute infarcts of the bilateral occipital lobes and anterior left pons. There was also evidence of remote infarcts involving the bilateral occipital lobes, bilateral cerebellum, dorsal left midbrain, bilateral centrum semiovale, bilateral thalami, and left frontal white matter. 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  Neurology was called from the ED and recommended admission and repeat  vessel imaging to rule out an acute thrombus and to rule out other causes . The patient also should be admitted to monitor for worsening of symptoms.  The patient denies fevers, chills, nausea, vomiting, cough, chest pain, shortness of breath. He denies hematemesis, melena, hematochezia, or coffee ground emesis.  [facility] were consulted to admit the patient for further evaluation and treatment.  Past Medical History:  Diagnosis Date  . HLD (hyperlipidemia)   . MI (myocardial infarction) (HCC)   . Stroke [facility])     Past Surgical History:  Procedure Laterality Date  . CORONARY STENT INTERVENTION      No family history on file. Social History:  reports that he has never smoked. He has never used smokeless tobacco. He reports previous alcohol use. He reports that he does not use drugs. (Not in a hospital admission)   Allergies: No Known Allergies  Pertinent items noted in HPI and remainder of comprehensive ROS otherwise negative.   General appearance: alert, cooperative and no distress Head: Normocephalic, without obvious abnormality, atraumatic Eyes: conjunctivae/corneas clear. PERRL, EOM's intact. Fundi benign. Throat: lips, mucosa, and tongue normal; teeth and gums normal Neck: no adenopathy, no carotid bruit, no JVD, supple, symmetrical, trachea midline and thyroid not enlarged, symmetric, no tenderness/mass/nodules Resp: No increased work of breathing. No wheezes, rales, or rhonchi. No tactile fremitus. Chest wall: no tenderness Cardio: regular rate and rhythm, S1, S2 normal, no murmur, click, rub or gallop GI: soft, non-tender; bowel sounds normal; no masses,  no organomegaly Extremities: extremities normal, atraumatic, no cyanosis or edema Pulses: 2+ and symmetric Skin: Skin color, texture, turgor normal.  No rashes or lesions Lymph nodes: Cervical, supraclavicular, and axillary nodes normal. Neurologic: Alert and oriented X 3, normal strength and tone. Normal  symmetric reflexes. Normal coordination and gait Speech is slurred.   Results for orders placed or performed during the hospital encounter of 08/26/19 (from the past 48 hour(s))  Protime-INR     Status: None   Collection Time: 08/26/19  6:58 AM  Result Value Ref Range   Prothrombin Time 12.7 11.4 - 15.2 seconds   INR 1.0 0.8 - 1.2    Comment: (NOTE) INR goal varies based on device and disease states. Performed at [facility], [address], [place], [place] [address]   APTT     Status: None   Collection Time: 08/26/19  6:58 AM  Result Value Ref Range   aPTT 28 24 - 36 seconds    Comment: Performed at [facility], [address] [place], [place] [address]  CBC     Status: None   Collection Time: 08/26/19  6:58 AM  Result Value Ref Range   WBC 7.9 4.0 - 10.5 K/uL   RBC 4.30 4.22 - 5.81 MIL/uL   Hemoglobin 14.0 13.0 - 17.0 g/dL   HCT 50.9 39 - 52 %   MCV 95.1 80.0 - 100.0 fL   MCH 32.6 26.0 - 34.0 pg   MCHC 34.2 30.0 - 36.0 g/dL   RDW 32.6 71.2 - 45.8 %   Platelets 281 150 - 400 K/uL   nRBC 0.0 0.0 - 0.2 %    Comment: Performed at [facility], [address] [place], [place] [address]  Differential     Status: None   Collection Time: 08/26/19  6:58 AM  Result Value Ref Range   Neutrophils Relative % 69 %   Neutro Abs 5.4 1.7 - 7.7 K/uL   Lymphocytes Relative 15 %   Lymphs Abs 1.2 0.7 - 4.0 K/uL   Monocytes Relative 11 %   Monocytes Absolute 0.9 0 - 1 K/uL   Eosinophils Relative 5 %   Eosinophils Absolute 0.4 0 - 0 K/uL   Basophils Relative 0 %   Basophils Absolute 0.0 0 - 0 K/uL   Immature Granulocytes 0 %   Abs Immature Granulocytes 0.02 0.00 - 0.07 K/uL    Comment: Performed at [facility], [address], [place], [place] [address]  Comprehensive metabolic panel     Status: Abnormal   Collection Time: 08/26/19  6:58 AM  Result Value Ref Range   Sodium 138 135 - 145 mmol/L   Potassium 3.7 3.5 - 5.1 mmol/L   Chloride 103 98 - 111  mmol/L   CO2 25 22 - 32 mmol/L   Glucose, Bld 163 (H) 70 - 99 mg/dL    Comment: Glucose reference range applies only to samples taken after fasting for at least 8 hours.   BUN 16 8 - 23 mg/dL   Creatinine, Ser 5.39 (H) 0.61 - 1.24 mg/dL   Calcium 9.4 8.9 - 76.7 mg/dL   Total Protein 7.4 6.5 - 8.1 g/dL   Albumin 3.4 (L) 3.5 - 5.0 g/dL   AST 23 15 - 41 U/L   ALT 22 0 - 44 U/L   Alkaline Phosphatase 106 38 - 126 U/L   Total Bilirubin 0.8 0.3 - 1.2 mg/dL   GFR calc non Af Amer 45 (L) >60 mL/min   GFR calc Af Amer 52 (L) >60 mL/min   Anion gap 10  5 - 15    Comment: Performed at [facility], [address] [place], [place] [address]  I-stat chem 8, ED     Status: Abnormal   Collection Time: 08/26/19  7:26 AM  Result Value Ref Range   Sodium 142 135 - 145 mmol/L   Potassium 3.6 3.5 - 5.1 mmol/L   Chloride 102 98 - 111 mmol/L   BUN 18 8 - 23 mg/dL   Creatinine, Ser 6.041.60 (H) 0.61 - 1.24 mg/dL   Glucose, Bld 540160 (H) 70 - 99 mg/dL    Comment: Glucose reference range applies only to samples taken after fasting for at least 8 hours.   Calcium, Ion 1.27 1.15 - 1.40 mmol/L   TCO2 26 22 - 32 mmol/L   Hemoglobin 13.6 13.0 - 17.0 g/dL   HCT 98.140.0 39 - 52 %  CBG monitoring, ED     Status: Abnormal   Collection Time: 08/26/19  2:11 PM  Result Value Ref Range   Glucose-Capillary 116 (H) 70 - 99 mg/dL    Comment: Glucose reference range applies only to samples taken after fasting for at least 8 hours.   Comment 1 Notify RN    Comment 2 Document in Chart   SARS Coronavirus 2 by RT PCR (hospital order, performed in [facility] hospital lab) Nasopharyngeal Nasopharyngeal Swab     Status: None   Collection Time: 08/26/19  3:00 PM   Specimen: Nasopharyngeal Swab  Result Value Ref Range   SARS Coronavirus 2 NEGATIVE NEGATIVE    Comment: (NOTE) SARS-CoV-2 target nucleic acids are NOT DETECTED.  The SARS-CoV-2 RNA is generally detectable in upper and lower respiratory specimens during the  acute phase of infection. The lowest concentration of SARS-CoV-2 viral copies this assay can detect is 250 copies / mL. A negative result does not preclude SARS-CoV-2 infection and should not be used as the sole basis for treatment or other patient management decisions.  A negative result may occur with improper specimen collection / handling, submission of specimen other than nasopharyngeal swab, presence of viral mutation(s) within the areas targeted by this assay, and inadequate number of viral copies (<250 copies / mL). A negative result must be combined with clinical observations, patient history, and epidemiological information.  Fact Sheet for Patients:   BoilerBrush.com.cyhttps://www.fda.gov/media/136312/download  Fact Sheet for Healthcare Providers: https://pope.com/https://www.fda.gov/media/136313/download  This test is not yet approved or  cleared by the [place] FDA and has been authorized for detection and/or diagnosis of SARS-CoV-2 by FDA under an Emergency Use Authorization (EUA).  This EUA will remain in effect (meaning this test can be used) for the duration of the COVID-19 declaration under Section 564(b)(1) of the Act, 21 U.S.C. section 360bbb-3(b)(1), unless the authorization is terminated or revoked sooner.  Performed at [facility], [address] [place], [place] [address]   HIV Antibody (routine testing w rflx)     Status: None   Collection Time: 08/26/19  7:11 PM  Result Value Ref Range   HIV Screen 4th Generation wRfx Non Reactive Non Reactive    Comment: Performed at [facility], [address] [place], [place] [address]  CBC     Status: None   Collection Time: 08/26/19  7:11 PM  Result Value Ref Range   WBC 6.9 4.0 - 10.5 K/uL   RBC 4.45 4.22 - 5.81 MIL/uL   Hemoglobin 14.4 13.0 - 17.0 g/dL   HCT 21.342.2 39 - 52 %   MCV  94.8 80.0 - 100.0 fL   MCH 32.4 26.0 - 34.0 pg   MCHC 34.1 30.0 - 36.0 g/dL   RDW 56.3 89.3 - 73.4 %   Platelets 278 150 - 400 K/uL   nRBC 0.0  0.0 - 0.2 %    Comment: Performed at [facility], [address], [place], [place] [address]  Creatinine, serum     Status: Abnormal   Collection Time: 08/26/19  7:11 PM  Result Value Ref Range   Creatinine, Ser 1.47 (H) 0.61 - 1.24 mg/dL   GFR calc non Af Amer 48 (L) >60 mL/min   GFR calc Af Amer 56 (L) >60 mL/min    Comment: Performed at [facility], [address], [place], [place] [address]   [email or web address] @  Blood pressure (!) 148/86, pulse 69, temperature 99.4 F (37.4 C), temperature source Oral, resp. rate 14, SpO2 100 %.    Assessment/Plan Problem  Stroke (Hcc)  Slurred Speech  Dyslipidemia  Ckd (Chronic Kidney Disease), Stage III  DM II (Diabetes Mellitus, Type Ii), Controlled (Hcc)  Cerebrovascular Disease   Stroke: New (08/21/2019) bilateral cerebellar infarcts in the setting of multiple infarcts of the cerebellar and occipital lobes bilaterally, left thalamus and posterior left midbrain. Stroke order set has been utilized. Pt will have MRA of head and neck as well as echocardiogram. He will be continued on ASA and Plavix. Swallow eval will be obtained. Neurology has been consulted.  Hypertension: Blood pressures are slightly elevated. The patient will be continued on Coreg and amlodipine as at home. Monitor.  Hyperlipidemia: Continue lipitor 80 mg as at home.  DM II: Check HbAic. Pt does not appear to be on any medication for his glucoses at home. Will place on FSBS and SSI, modified carbohydrate diet, and a hypoglycemic protocol.  I have seen and examined this patient myself. I have spent 76 minutes in his evaluation and care.  DVT Prophylaxis: SCD's CODE STATUS: Full Code Family Communication: Wife at bedside Disposition: Patient is from home. He will be admitted to a progressive bed. Anticipate discharge to home.  Glori Machnik 08/26/2019, 9:08 PM

## 2019-08-26 NOTE — ED Notes (Signed)
Pt transported to MRI 

## 2019-08-26 NOTE — ED Triage Notes (Signed)
Per pt's family, pt had a stroke this past week in [facility].  He did not stay in the hospital for the MRI.  Pt's wife stated that he has had increased slurring of his words since this past Monday and he does not walk as well.  This started soon after he left the hospital.

## 2019-08-26 NOTE — Consult Note (Signed)
Date of service: August 26, 2019 Patient Name: [name] MRN: [id number] DOB: 08/02/1950 Reason for consult: "strokes on MRI"  [name] is a 69 y.o. male  has a past medical history of HLD (hyperlipidemia), MI (myocardial infarction) (HCC), and prior Strokes (HCC). who presents with dysarthria.  Discharged from Rehab and went to [place] last thursday. Wife noted he was sitting on the bottom steps on Sunday evening and didn't seem right. Called in a hospice nurse who was closed by and was noted to have dysarthria and was taken to local ED. CTH was negative. Pt declined MRI and admission.  He came in today after being asked by his PCP and MRI Brain demosntrated acute infarcts involving bilateral occipital and anterior left pons.  He also has remote infarcts involving bilateral occipital lobes, bilateral cerebellum, dorsal left midbrain, bilateral centrum semiovale, bilateral thalamic eye and left frontal white matter.  Of note, he recently was admitted to an outside hospital for posterior circulation infarcts involving bilateral cerebellum and occipital lobes as well as left thalamus, posterior left midbrain.  CT angio head and neck in June 2021 with severe bilateral vertebrobasilar junction stenosis and advanced atherosclerotic changes elsewhere in the head and neck including moderate to severe bilateral vertebral artery origin stenosis, 60% stenosis left ICA bulb.  He endorses that his vision has been blurry since the stroke in April and he did not see any change this time. Does not smoke, does not drink alcohol.  No nausea, no vomiting, no arm or leg weakness or numbness. ROS ROS: - CONSTITUTIONAL: Denies weight loss, fever and chills. - HEENT: Denies changes in vision and hearing. - RESPIRATORY: Denies SOB and cough. - CV: Denies palpitations and CP. - GI: Denies abdominal pain, nausea, vomiting and diarrhea. - GU: Denies dysuria and urinary frequency. - MSK: Denies myalgia and joint  pain. - SKIN: Denies rash and pruritus. - NEUROLOGICAL: Denies headache and syncope. - PSYCHIATRIC: Denies recent changes in mood. Denies anxiety and depression.  Past History Past Medical History:  Diagnosis Date  . HLD (hyperlipidemia)   . MI (myocardial infarction) (HCC)   . Stroke [facility])    Past Surgical History:  Procedure Laterality Date  . CORONARY STENT INTERVENTION     No family history on file. Social History   Tobacco Use  . Smoking status: Never Smoker  . Smokeless tobacco: Never Used  Substance Use Topics  . Alcohol use: Not Currently  . Drug use: Never   No Known Allergies   (Not in a hospital admission)    Temp:  [99.4 F (37.4 C)] 99.4 F (37.4 C) (07/23 0648) Pulse Rate:  [61-84] 61 (07/23 1702) Resp:  [15-20] 15 (07/23 1702) BP: (127-157)/(77-90) 157/83 (07/23 1702) SpO2:  [94 %-100 %] 100 % (07/23 1702) There is no height or weight on file to calculate BMI.  General: Laying comfortably in bed; in no acute distress. HENT: Normal oropharynx and mucosa. Normal external appearance of ears and nose Neck: Supple, no pain or tenderness. CV: RRR without murmur. No peripheral edema. Pulmonary: Symmetric chest rise. Normal respiratoy effort. Abdomen: positive bowel sounds, soft, non-tender. Normal liver and spleen size. Ext: No cyanosis, edema, or deformity. Skin: No rash. Normal palpation of skin. Musculoskeletal: Normal digits and nails by inspection. No Clubbing.  Neurologic Examination  MENTAL STATUS: AAOx3, memory intact, fund of knowledge appropriate LANG/SPEECH: Naming and repetition intact, fluent, follows 3-step commands CRANIAL NERVES: II: Pupils equal and reactive, no RAPD, Visual acuity decreased BL, peripheral vision  appears intact. III, IV, VI: EOM intact, no gaze preference or deviation, no nystagmus. V: normal sensation in V1, V2, and V3 segments bilaterally VII: no asymmetry, no nasolabial fold flattening VIII: normal hearing to  speech IX, X: normal palatal elevation, no uvular deviation XI: 5/5 head turn and 5/5 shoulder shrug bilaterally XII: midline tongue protrusion MOTOR: 5/5 muscle power in Rt shoulder abductors/adductors, elbow flexors/extensors, wrist flexors/extensors, finger abductors/adductors.  5/5 in Rt hipflexors/extensors, knee flexors/extensors, ankle dorsiflexors and planter flexors.   5/5 muscle power in Lt shoulder abductors/adductors, elbow flexors/extensors, wrist flexors/extensors, finger abductors/adductors.  5/5 in Lt hipflexors/extensors, knee flexors/extensors, ankle dorsiflexors and planter flexors.   REFLEXES: 2/4 throughout, bilateral flexor planter response, no Hoffman's, no clonus SENSORY: Normal to touch, pinprick, vibration in all limbs. No hemineglect, no extinction to double sided stimulation (visual & tactile) Romberg absent Coordination: Normal finger to nose and heel to shin, no tremor, no dysmetria   Imaging/Labs/Diagnostics:  MRI Brain without contrast: IMPRESSION: Acute infarcts involving the bilateral occipital lobes and anterior left pons. Remote infarcts involving the bilateral occipital lobes, bilateral cerebellum, dorsal left midbrain, bilateral centrum semiovale, bilateral thalami and left frontal white matter. 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  CTH w/o contrast: IMPRESSION: 1. No acute intracranial abnormality. Signs of prior infarct, atrophy and chronic microvascular ischemic change. 2. Infarcts exhibited recent MRI not well seen on today's study with evidence of remote infarct in the bilateral occipital poles but without signs of acute intracranial process. 3. Bilateral maxillary sinus disease, new since previous imaging worse on the RIGHT.   Impression and plan: [name] is a 69 y.o. male with PMH significant for has a past medical history of HLD (hyperlipidemia), MI (myocardial infarction) (HCC), and prior  Strokes (HCC). who presents with dysarthria.  Neuro exam with decreased visual acuity and slurred speech.  He had MRI brain without contrast which demonstrated bilateral occipital lobe and anterior left pons stroke along with known remote infarcts in the posterior circulation.  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  Given the extent of stenosis, I do believe that his strokes are potentially due to known posterior circulation atherosclerosis.  But he needs further work-up and repeat vessel imaging to rule out an acute thrombus and to rule out other causes and needs monitoring overnight to ensure he has no further worsening of his symptoms.  I discussed with him that we would like to get further work-up with vessel imaging to assess for progression of the noted stenosis along with our standard stroke work-up with echocardiogram, telemetry, LDL, hemoglobin A1c.  At this time he is a little hesitant about admission.  He is frustrated with no improvement in his symptoms with 2 prior hospitalizations in April and June.  Recs: Acute Ischemic Stroke, posterior circulation.  Risk factors Etiology:   Recommend #MRA Head and neck  #Transthoracic Echo  # continue home Aspirin and plavix #Continue Atorvastatin 80 mg/other high intensity statin # BP goal: permissive HTN upto 220/120 mmHg ( 185/110 if patient has CHF, CKD) # HBAIC and Lipid profile # Telemetry monitoring # Frequent neuro checks # NPO until passes stroke swallow screen  Please page stroke NP  Or  PA  Or MD from 8am -4 pm  as this patient from this time will be  followed by the stroke.   You can look them up  on [email or web address]  Password [email or web address]  ______________________________________________________________________  Thank you for the opportunity to take part in the care  of this patient. If you have any further questions, please contact the neurology consultation attending on call. Signed,   Erick Blinks

## 2019-08-26 NOTE — ED Provider Notes (Signed)
[facility] EMERGENCY DEPARTMENT Provider Note   CSN: [id number] Arrival date & time: 08/26/19  9163     History Chief Complaint  Patient presents with  . stroke symptoms    [name] is a 69 y.o. male.     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  Previous admissions were at [facility].  During previous admissions patient was found to have severe bilateral vertebrobasilar junction stenosis and most recently in June was found to have new acute infarcts in the cerebellum, occipital lobes bilaterally, left thalamus, left posterior midbrain.  Patient has been on DAPT therapy.  Patient was admitted from 6/27-6/30/2021 and then was transferred to rehab where he stayed until 7/14.  He then went out of town with family to [facility] and on 7/18 had an episode where he became diaphoretic, confused and weak with slurring of his words.  They presented to the hospital in [facility] and he had an head CT done.  They were unable to obtain an MRI until the next day and he refused admission to the hospital there.  Per the patient's wife, he made improvements with his gait and dysarthria while in rehab however his symptoms seem to acutely become worse again on 7/18.  This week, patient refused to come to the emergency department for further evaluation.  They present today with persistent symptoms.  Patient denies fever, CP, N/V/D. Pt states he feels good.    CT Head Wo Contrast  Result Date: 07/31/2019 CLINICAL DATA: Weakness EXAM: CT HEAD WITHOUT CONTRAST TECHNIQUE: Contiguous axial images were obtained from the base of the skull through the vertex without intravenous contrast. COMPARISON: 05/14/2019 FINDINGS: Brain: Old bilateral occipital infarcts. Diffuse cerebral atrophy. No acute intracranial  abnormality. Specifically, no hemorrhage, hydrocephalus, mass lesion, acute infarction, or significant intracranial injury. Vascular: No hyperdense vessel or unexpected calcification. Skull: No acute calvarial abnormality. Sinuses/Orbits: Visualized paranasal sinuses and mastoids clear. Orbital soft tissues unremarkable. Other: None IMPRESSION: Old bilateral occipital infarcts. No acute intracranial abnormality. Electronically Signed By: Charlett Nose M.D. On: 07/31/2019 22:59   CT Angiography Neck and Head  Result Date: 08/01/2019 ADDENDUM REPORT: 08/01/2019 11:22 ADDENDUM: Study discussed by telephone with Dr. Gwenyth Bouillon RASOOL on 08/01/2019 at 1113 hours. And he plans consultation with Neurology and NIR. Electronically Signed By: Odessa Fleming M.D. On: 08/01/2019 11:22 CLINICAL DATA: 69 year old male with slurred speech, generalized weakness. Bilateral PCA territory infarcts in April this year. EXAM: CT ANGIOGRAPHY HEAD AND NECK TECHNIQUE: Multidetector CT imaging of the head and neck was performed using the standard protocol during bolus administration of intravenous contrast. Multiplanar CT image reconstructions and MIPs were obtained to evaluate the vascular anatomy. Carotid stenosis measurements (when applicable) are obtained utilizing NASCET criteria, using the distal internal carotid diameter as the denominator. CONTRAST: 70 mL Omnipaque 350 COMPARISON: Head CT 2245 hours last night. CTA head and neck, brain MRI 05/14/2019. FINDINGS: CT HEAD Brain: Expected evolution of bilateral PCA territory infarcts since April with encephalomalacia. Superimposed chronic lacunar infarct of the right pons. Subtle superior cerebellar infarcts suspected on series 3, image 13 and new since April. Elsewhere gray-white matter differentiation is stable. No midline shift, ventriculomegaly, mass effect, evidence of mass lesion, intracranial hemorrhage or evidence of cortically based acute infarction. Calvarium and skull base: No acute  osseous abnormality identified. Paranasal sinuses: Visualized paranasal sinuses  and mastoids are stable and well pneumatized. Orbits: No acute orbit or scalp soft tissue finding. CTA NECK Skeleton: Stable. Poor dentition and cervical spine degeneration. Upper chest: Stable with some mediastinal lipomatosis. Other neck: No acute findings. Aortic arch: Stable arch atherosclerosis including confluent mural soft plaque or thrombus distal to the left subclavian. Right carotid system: Stable mostly soft plaque in the right CCA and at the bifurcation without significant stenosis. Left carotid system: Stable soft plaque in the left CCA and proximal left ICA with up to 60% stenosis at the left bulb (series 13, image 110). Vertebral arteries: No proximal right subclavian artery stenosis. Moderate to severe plaque and stenosis at the right vertebral artery origin and V1 segment appears stable. The right V2 and V3 segments remain patent without significant stenosis. No proximal left subclavian artery stenosis despite soft plaque. Patent left vertebral origin without stenosis but moderate to severe left V1 segment plaque and stenosis again noted and stable. Left V2 and V3 segments remain patent and appear mildly dominant without significant stenosis. CTA HEAD Posterior circulation: The proximal V4 segments and PICA origins remain patent. Severe distal V4 and vertebrobasilar junction stenoses are redemonstrated and appear progressed bilaterally (especially on the left) since April (series 13, image 133, 132). Despite this the basilar artery remains patent to the basilar tip with stable mild irregularity, no significant stenosis. SCA and PCA origins remain patent. Posterior communicating arteries are diminutive or absent. There is only mild bilateral PCA irregularity which is greater on the right. No proximal SCA occlusion. Anterior circulation: Bilateral ICA siphons remain patent with heavy calcified plaque as before. Stable  moderate bilateral siphon stenosis. Carotid termini remain patent. Stable MCA and ACA origins. Diminutive or absent anterior communicating artery. Bilateral ACA branches appear within normal limits today. Right MCA M1 segment and bifurcation remain patent without stenosis. Right MCA branches are stable with mild irregularity. Mild to moderate proximal left M1 stenosis appears stable on series 16, image 28. Left MCA bifurcation remains patent. Left MCA branches are stable with mild irregularity. Venous sinuses: Early contrast timing but grossly patent. Anatomic variants: Unchanged mildly dominant left vertebral artery. Review of the MIP images confirms the above findings IMPRESSION: 1. 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 However, the Basilar Artery and SCA origins remain patent. 2. Stable advanced atherosclerosis elsewhere in the head and neck including: - moderate to severe bilateral vertebral artery origin stenoses. - 60% stenosis left ICA bulb. - moderate bilateral ICA siphon stenosis. - mild to moderate left MCA M1 stenosis. 3. Expected evolution of the bilateral PCA territory infarcts since April. 4. Aortic Atherosclerosis (ICD10-I70.0). Electronically Signed: By: Odessa Fleming M.D. On: 08/01/2019 11:01   MR BRAIN WO CONTRAST  Result Date: 08/01/2019 CLINICAL DATA: Slurred speech EXAM: MRI HEAD WITHOUT CONTRAST TECHNIQUE: Multiplanar, multiecho pulse sequences of the brain and surrounding structures were obtained without intravenous contrast. COMPARISON: 05/14/2019 FINDINGS: Brain: There are multiple new foci of reduced diffusion in the posterior circulation bilaterally with involvement of the cerebellum, occipital lobes, left dorsal midbrain, and left thalamus. Sequelae of prior bilateral occipital infarcts noted. Punctate focus of susceptibility in the right occipital lobe is compatible with  chronic microhemorrhage or less likely mineralization. Prominence of the ventricles and sulci reflects similar generalized parenchymal volume loss. There are chronic small vessel infarcts of the central white matter bilaterally. No hydrocephalus. No intracranial mass or significant mass effect. Vascular: Diminished flow void of the  intracranial right vertebral artery. Vessels are better evaluated on same day CTA. Skull and upper cervical spine: Marrow signal is within normal limits. Sinuses/Orbits: Minor mucosal thickening. Orbits are unremarkable. Other: Sella is unremarkable. Mastoid air cells are clear. IMPRESSION: Multiple acute infarcts of the cerebellum and occipital lobes bilaterally as well as the left thalamus and posterior left mid brain. Multiple chronic infarcts including bilateral occipital lobe infarcts seen in April. Electronically Signed By: Guadlupe SpanishPraneil Patel M.D. On: 08/01/2019 14:14           No past medical history on file.  There are no problems to display for this patient.   The histories are not reviewed yet. Please review them in the "History" navigator section and refresh this SmartLink.     No family history on file.  Social History   Tobacco Use  . Smoking status: Not on file  Substance Use Topics  . Alcohol use: Not on file  . Drug use: Not on file    Home Medications Prior to Admission medications   Medication Sig Start Date End Date Taking? Authorizing Provider  amLODipine (NORVASC) 2.5 MG tablet Take 2.5 mg by mouth daily. 08/17/19  Yes [provider]  aspirin EC 81 MG tablet Take 81 mg by mouth daily. Swallow whole.   Yes [provider]  atorvastatin (LIPITOR) 80 MG tablet Take 80 mg by mouth daily at 6 PM.  08/17/19  Yes [provider]  carvedilol (COREG) 3.125 MG tablet Take 3.125 mg by mouth 2 (two) times daily. 07/07/19  Yes [provider]  clopidogrel (PLAVIX) 75 MG tablet Take 75 mg by mouth daily. 08/17/19  Yes  [provider]  febuxostat (ULORIC) 40 MG tablet Take 40 mg by mouth daily. 08/17/19  Yes [provider]  FLUoxetine (PROZAC) 10 MG capsule Take 10 mg by mouth daily. 08/17/19  Yes [provider]  Misc Natural Products (TART CHERRY ADVANCED PO) Take 1,200 mg by mouth daily.   Yes [provider]  MITIGARE 0.6 MG CAPS Take 0.6-1.2 mg by mouth 2 (two) times daily as needed (gout flareup).  06/10/19  Yes [provider]  Multiple Vitamins-Minerals (ONE-A-DAY MENS 50+ PO) Take 1 tablet by mouth daily.   Yes [provider]    Allergies    Patient has no known allergies.  Review of Systems   Review of Systems  Constitutional: Negative for fever.  HENT: Positive for congestion. Negative for rhinorrhea and sore throat.   Eyes: Positive for visual disturbance. Negative for redness.  Respiratory: Negative for cough.   Cardiovascular: Negative for chest pain.  Gastrointestinal: Negative for abdominal pain, diarrhea, nausea and vomiting.  Genitourinary: Negative for dysuria.  Musculoskeletal: Positive for gait problem. Negative for myalgias.  Skin: Negative for rash.  Neurological: Positive for dizziness (transient) and light-headedness. Negative for facial asymmetry, numbness and headaches.    Physical Exam Updated Vital Signs BP (!) 134/88 (BP Location: Left Arm)   Pulse 69   Temp 99.4 F (37.4 C) (Oral)   Resp 18   SpO2 94%   Physical Exam Vitals and nursing note reviewed.  Constitutional:      Appearance: He is well-developed.  HENT:     Head: Normocephalic and atraumatic.     Right Ear: Tympanic membrane, ear canal and external ear normal.     Left Ear: Tympanic membrane, ear canal and external ear normal.     Nose: Nose normal.     Mouth/Throat:  Pharynx: Uvula midline.  Eyes:     General: Lids are normal.     Conjunctiva/sclera: Conjunctivae normal.     Pupils: Pupils are equal, round, and reactive to light.    Cardiovascular:     Rate and Rhythm: Normal rate and regular rhythm.  Pulmonary:     Effort: Pulmonary effort is normal.     Breath sounds: Normal breath sounds.  Abdominal:     Palpations: Abdomen is soft.     Tenderness: There is no abdominal tenderness.  Musculoskeletal:        General: Normal range of motion.     Cervical back: Normal range of motion and neck supple. No tenderness or bony tenderness.  Skin:    General: Skin is warm and dry.  Neurological:     Mental Status: He is alert and oriented to person, place, and time.     GCS: GCS eye subscore is 4. GCS verbal subscore is 5. GCS motor subscore is 6.     Cranial Nerves: Cranial nerve deficit and dysarthria present. No facial asymmetry.     Sensory: No sensory deficit.     Motor: Weakness present. No abnormal muscle tone.     Coordination: Romberg sign negative. Coordination normal. Finger-Nose-Finger Test normal.     Deep Tendon Reflexes: Reflexes are normal and symmetric.     Comments: Trace R upper extremity weakness. Gait testing deferred.      ED Results / Procedures / Treatments   Labs (all labs ordered are listed, but only abnormal results are displayed) Labs Reviewed  COMPREHENSIVE METABOLIC PANEL - Abnormal; Notable for the following components:      Result Value   Glucose, Bld 163 (*)    Creatinine, Ser 1.56 (*)    Albumin 3.4 (*)    GFR calc non Af Amer 45 (*)    GFR calc Af Amer 52 (*)    All other components within normal limits  I-STAT CHEM 8, ED - Abnormal; Notable for the following components:   Creatinine, Ser 1.60 (*)    Glucose, Bld 160 (*)    All other components within normal limits  CBG MONITORING, ED - Abnormal; Notable for the following components:   Glucose-Capillary 116 (*)    All other components within normal limits  SARS CORONAVIRUS 2 BY RT PCR (HOSPITAL ORDER, PERFORMED IN [place] [facility])  PROTIME-INR  APTT  CBC  DIFFERENTIAL    EKG EKG  Interpretation  Date/Time:  Friday August 26 2019 06:44:53 EDT Ventricular Rate:  83 PR Interval:  172 QRS Duration: 136 QT Interval:  388 QTC Calculation: 455 R Axis:   47 Text Interpretation: Normal sinus rhythm Right bundle branch block Cannot rule out Anterior infarct , age undetermined No previous tracing Confirmed by Gwyneth Sprout ([id number]) on 08/26/2019 2:32:45 PM   Radiology CT HEAD WO CONTRAST  Result Date: 08/26/2019 CLINICAL DATA:  Neuro deficit. EXAM: CT HEAD WITHOUT CONTRAST TECHNIQUE: Contiguous axial images were obtained from the base of the skull through the vertex without intravenous contrast. COMPARISON:  MRI of the brain of August 01, 2019 FINDINGS: Brain: No evidence of acute infarction, hemorrhage, hydrocephalus, extra-axial collection or mass lesion/mass effect. Infarcts exhibited recent MRI not well seen on today's study with evidence of remote infarct in the bilateral occipital poles but without signs of acute intracranial process. Vascular: No hyperdense vessel or unexpected calcification. Skull: Normal. Negative for fracture or focal lesion. Sinuses/Orbits: Bilateral maxillary sinus disease, new since previous imaging worse  on the RIGHT. Other: None. IMPRESSION: 1. No acute intracranial abnormality. Signs of prior infarct, atrophy and chronic microvascular ischemic change. 2. Infarcts exhibited recent MRI not well seen on today's study with evidence of remote infarct in the bilateral occipital poles but without signs of acute intracranial process. 3. Bilateral maxillary sinus disease, new since previous imaging worse on the RIGHT. Electronically Signed   By: Donzetta Kohut M.D.   On: 08/26/2019 08:32    Procedures Procedures (including critical care time)  Medications Ordered in ED Medications  sodium chloride flush (NS) 0.9 % injection 3 mL (has no administration in time range)    ED Course  I have reviewed the triage vital signs and the nursing notes.  Pertinent  labs & imaging results that were available during my care of the patient were reviewed by me and considered in my medical decision making (see chart for details).  Patient seen and examined. Reviewed admissions from [facility] in 4/21 and 6-7/21. Work-up reviewed here. MRI ordered.   Vital signs reviewed and are as follows: BP (!) 140/83   Pulse 69   Temp 99.4 F (37.4 C) (Oral)   Resp 16   SpO2 97%   Will need MRI to determine how to proceed. If patient has progression of old or new CVA, will need to discuss with neurohospitalist.   Patient discussed with and seen by Dr. Anitra Lauth.   Signout to [employer] at shift change.     MDM Rules/Calculators/A&P                          Pending completion of work-up.    Final Clinical Impression(s) / ED Diagnoses Final diagnoses:  Dysarthria  Cerebrovascular accident (CVA) due to bilateral stenosis of vertebral arteries [facility])    Rx / DC Orders ED Discharge Orders    None       Renne Crigler, PA-C 08/26/19 1511    Gwyneth Sprout, MD 08/26/19 [phone]

## 2019-08-27 ENCOUNTER — Observation Stay (HOSPITAL_COMMUNITY): Payer: Medicare HMO

## 2019-08-27 DIAGNOSIS — R29705 NIHSS score 5: Secondary | ICD-10-CM | POA: Diagnosis present

## 2019-08-27 DIAGNOSIS — R262 Difficulty in walking, not elsewhere classified: Secondary | ICD-10-CM | POA: Diagnosis present

## 2019-08-27 DIAGNOSIS — E1122 Type 2 diabetes mellitus with diabetic chronic kidney disease: Secondary | ICD-10-CM | POA: Diagnosis present

## 2019-08-27 DIAGNOSIS — Z515 Encounter for palliative care: Secondary | ICD-10-CM | POA: Diagnosis not present

## 2019-08-27 DIAGNOSIS — R319 Hematuria, unspecified: Secondary | ICD-10-CM | POA: Diagnosis not present

## 2019-08-27 DIAGNOSIS — Z781 Physical restraint status: Secondary | ICD-10-CM | POA: Diagnosis not present

## 2019-08-27 DIAGNOSIS — H538 Other visual disturbances: Secondary | ICD-10-CM | POA: Diagnosis present

## 2019-08-27 DIAGNOSIS — E1159 Type 2 diabetes mellitus with other circulatory complications: Secondary | ICD-10-CM | POA: Diagnosis not present

## 2019-08-27 DIAGNOSIS — I679 Cerebrovascular disease, unspecified: Secondary | ICD-10-CM

## 2019-08-27 DIAGNOSIS — E785 Hyperlipidemia, unspecified: Secondary | ICD-10-CM | POA: Diagnosis present

## 2019-08-27 DIAGNOSIS — I69398 Other sequelae of cerebral infarction: Secondary | ICD-10-CM | POA: Diagnosis not present

## 2019-08-27 DIAGNOSIS — I6602 Occlusion and stenosis of left middle cerebral artery: Secondary | ICD-10-CM | POA: Diagnosis present

## 2019-08-27 DIAGNOSIS — I6389 Other cerebral infarction: Secondary | ICD-10-CM | POA: Diagnosis not present

## 2019-08-27 DIAGNOSIS — Z20822 Contact with and (suspected) exposure to covid-19: Secondary | ICD-10-CM | POA: Diagnosis present

## 2019-08-27 DIAGNOSIS — E876 Hypokalemia: Secondary | ICD-10-CM | POA: Diagnosis present

## 2019-08-27 DIAGNOSIS — G9341 Metabolic encephalopathy: Secondary | ICD-10-CM | POA: Diagnosis not present

## 2019-08-27 DIAGNOSIS — Z7189 Other specified counseling: Secondary | ICD-10-CM

## 2019-08-27 DIAGNOSIS — I2510 Atherosclerotic heart disease of native coronary artery without angina pectoris: Secondary | ICD-10-CM | POA: Diagnosis present

## 2019-08-27 DIAGNOSIS — R410 Disorientation, unspecified: Secondary | ICD-10-CM | POA: Diagnosis not present

## 2019-08-27 DIAGNOSIS — I639 Cerebral infarction, unspecified: Secondary | ICD-10-CM | POA: Diagnosis present

## 2019-08-27 DIAGNOSIS — R4781 Slurred speech: Secondary | ICD-10-CM | POA: Diagnosis not present

## 2019-08-27 DIAGNOSIS — I651 Occlusion and stenosis of basilar artery: Secondary | ICD-10-CM | POA: Diagnosis not present

## 2019-08-27 DIAGNOSIS — Z7902 Long term (current) use of antithrombotics/antiplatelets: Secondary | ICD-10-CM | POA: Diagnosis not present

## 2019-08-27 DIAGNOSIS — Z8673 Personal history of transient ischemic attack (TIA), and cerebral infarction without residual deficits: Secondary | ICD-10-CM

## 2019-08-27 DIAGNOSIS — I63213 Cerebral infarction due to unspecified occlusion or stenosis of bilateral vertebral arteries: Secondary | ICD-10-CM | POA: Diagnosis present

## 2019-08-27 DIAGNOSIS — R339 Retention of urine, unspecified: Secondary | ICD-10-CM | POA: Diagnosis not present

## 2019-08-27 DIAGNOSIS — G8321 Monoplegia of upper limb affecting right dominant side: Secondary | ICD-10-CM | POA: Diagnosis present

## 2019-08-27 DIAGNOSIS — I63219 Cerebral infarction due to unspecified occlusion or stenosis of unspecified vertebral arteries: Secondary | ICD-10-CM | POA: Diagnosis not present

## 2019-08-27 DIAGNOSIS — Z955 Presence of coronary angioplasty implant and graft: Secondary | ICD-10-CM | POA: Diagnosis not present

## 2019-08-27 DIAGNOSIS — N1831 Chronic kidney disease, stage 3a: Secondary | ICD-10-CM | POA: Diagnosis not present

## 2019-08-27 DIAGNOSIS — K5900 Constipation, unspecified: Secondary | ICD-10-CM | POA: Diagnosis not present

## 2019-08-27 DIAGNOSIS — I6322 Cerebral infarction due to unspecified occlusion or stenosis of basilar arteries: Secondary | ICD-10-CM | POA: Diagnosis not present

## 2019-08-27 DIAGNOSIS — I129 Hypertensive chronic kidney disease with stage 1 through stage 4 chronic kidney disease, or unspecified chronic kidney disease: Secondary | ICD-10-CM | POA: Diagnosis present

## 2019-08-27 DIAGNOSIS — I252 Old myocardial infarction: Secondary | ICD-10-CM | POA: Diagnosis not present

## 2019-08-27 DIAGNOSIS — E118 Type 2 diabetes mellitus with unspecified complications: Secondary | ICD-10-CM | POA: Diagnosis not present

## 2019-08-27 DIAGNOSIS — R471 Dysarthria and anarthria: Secondary | ICD-10-CM

## 2019-08-27 DIAGNOSIS — E669 Obesity, unspecified: Secondary | ICD-10-CM | POA: Diagnosis present

## 2019-08-27 DIAGNOSIS — E1165 Type 2 diabetes mellitus with hyperglycemia: Secondary | ICD-10-CM | POA: Diagnosis present

## 2019-08-27 LAB — CBC WITH DIFFERENTIAL/PLATELET
Abs Immature Granulocytes: 0.02 10*3/uL (ref 0.00–0.07)
Basophils Absolute: 0 10*3/uL (ref 0.0–0.1)
Basophils Relative: 0 %
Eosinophils Absolute: 0.4 10*3/uL (ref 0.0–0.5)
Eosinophils Relative: 6 %
HCT: 37.9 % — ABNORMAL LOW (ref 39.0–52.0)
Hemoglobin: 13.1 g/dL (ref 13.0–17.0)
Immature Granulocytes: 0 %
Lymphocytes Relative: 20 %
Lymphs Abs: 1.3 10*3/uL (ref 0.7–4.0)
MCH: 32 pg (ref 26.0–34.0)
MCHC: 34.6 g/dL (ref 30.0–36.0)
MCV: 92.4 fL (ref 80.0–100.0)
Monocytes Absolute: 0.8 10*3/uL (ref 0.1–1.0)
Monocytes Relative: 12 %
Neutro Abs: 3.8 10*3/uL (ref 1.7–7.7)
Neutrophils Relative %: 62 %
Platelets: 245 10*3/uL (ref 150–400)
RBC: 4.1 MIL/uL — ABNORMAL LOW (ref 4.22–5.81)
RDW: 13.2 % (ref 11.5–15.5)
WBC: 6.2 10*3/uL (ref 4.0–10.5)
nRBC: 0 % (ref 0.0–0.2)

## 2019-08-27 LAB — LIPID PANEL
Cholesterol: 100 mg/dL (ref 0–200)
Cholesterol: 96 mg/dL (ref 0–200)
HDL: 21 mg/dL — ABNORMAL LOW (ref >40)
HDL: 22 mg/dL — ABNORMAL LOW (ref >40)
LDL Cholesterol: 54 mg/dL (ref 0–99)
LDL Cholesterol: 55 mg/dL (ref 0–99)
Total CHOL/HDL Ratio: 4.8 RATIO
Total CHOL/HDL Ratio: 4.4 RATIO
Triglycerides: 124 mg/dL (ref <150)
Triglycerides: 96 mg/dL (ref <150)
VLDL: 25 mg/dL (ref 0–40)
VLDL: 19 mg/dL (ref 0–40)

## 2019-08-27 LAB — HEMOGLOBIN A1C
Hgb A1c MFr Bld: 6.1 % — ABNORMAL HIGH (ref 4.8–5.6)
Hgb A1c MFr Bld: 6.2 % — ABNORMAL HIGH (ref 4.8–5.6)
Mean Plasma Glucose: 128.37 mg/dL
Mean Plasma Glucose: 131.24 mg/dL

## 2019-08-27 LAB — COMPREHENSIVE METABOLIC PANEL
ALT: 23 U/L (ref 0–44)
AST: 24 U/L (ref 15–41)
Albumin: 3.2 g/dL — ABNORMAL LOW (ref 3.5–5.0)
Alkaline Phosphatase: 122 U/L (ref 38–126)
Anion gap: 11 (ref 5–15)
BUN: 15 mg/dL (ref 8–23)
CO2: 25 mmol/L (ref 22–32)
Calcium: 8.9 mg/dL (ref 8.9–10.3)
Chloride: 102 mmol/L (ref 98–111)
Creatinine, Ser: 1.35 mg/dL — ABNORMAL HIGH (ref 0.61–1.24)
GFR calc Af Amer: >60 mL/min (ref >60)
GFR calc non Af Amer: 54 mL/min — ABNORMAL LOW (ref >60)
Glucose, Bld: 129 mg/dL — ABNORMAL HIGH (ref 70–99)
Potassium: 3.7 mmol/L (ref 3.5–5.1)
Sodium: 138 mmol/L (ref 135–145)
Total Bilirubin: 0.8 mg/dL (ref 0.3–1.2)
Total Protein: 6.8 g/dL (ref 6.5–8.1)

## 2019-08-27 LAB — MAGNESIUM: Magnesium: 1.8 mg/dL (ref 1.7–2.4)

## 2019-08-27 LAB — ECHOCARDIOGRAM COMPLETE
Area-P 1/2: 2.73 cm2
Height: 70 in
P 1/2 time: 745 msec
S' Lateral: 3.4 cm
Weight: 3840 oz

## 2019-08-27 LAB — GLUCOSE, CAPILLARY
Glucose-Capillary: 95 mg/dL (ref 70–99)
Glucose-Capillary: 117 mg/dL — ABNORMAL HIGH (ref 70–99)

## 2019-08-27 LAB — PHOSPHORUS: Phosphorus: 3.6 mg/dL (ref 2.5–4.6)

## 2019-08-27 IMAGING — CT CT ANGIO HEAD
1 of 11 series · 5 of 33 positions shown · IV contrast (OMNI 350)
Comparison: [DATE]

CLINICAL DATA: Stroke follow-up

EXAM:
CT ANGIOGRAPHY HEAD AND NECK
TECHNIQUE: Multidetector CT imaging of the head and neck was performed using
the standard protocol during bolus administration of intravenous
contrast. Multiplanar CT image reconstructions and MIPs were
obtained to evaluate the vascular anatomy. Carotid stenosis
measurements (when applicable) are obtained utilizing NASCET
criteria, using the distal internal carotid diameter as the
denominator.
CONTRAST:  75mL OMNIPAQUE IOHEXOL 350 MG/ML SOLN

[Series 11: cta neck axial · axial · 0.39mm/px · z∈[-289,-53]mm · 5 of 355 slices shown]
[im 60/355  soft-tissue]
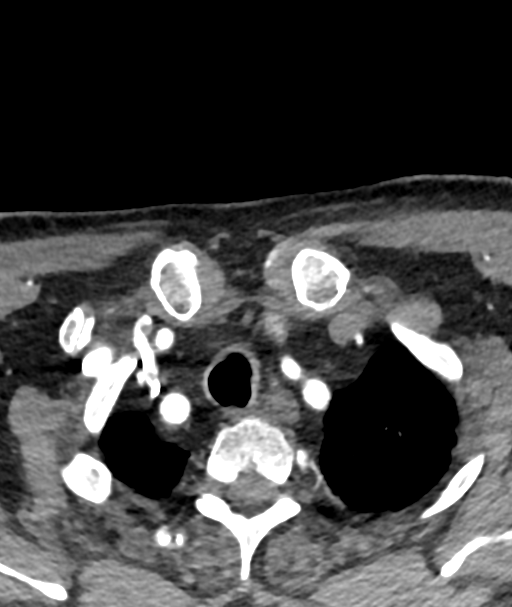
[im 119/355  bone]
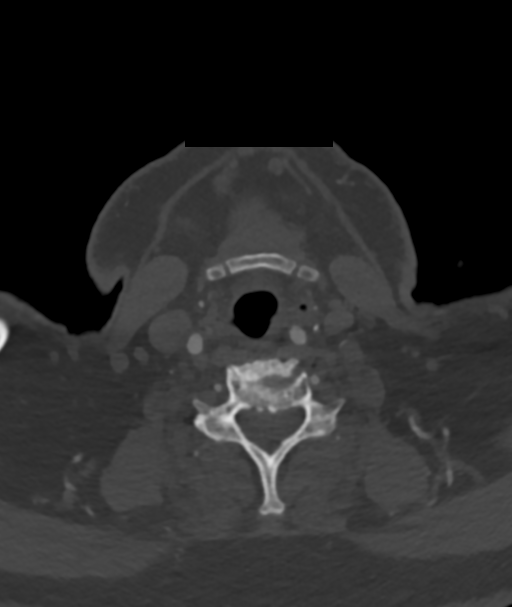
[im 178/355  soft-tissue]
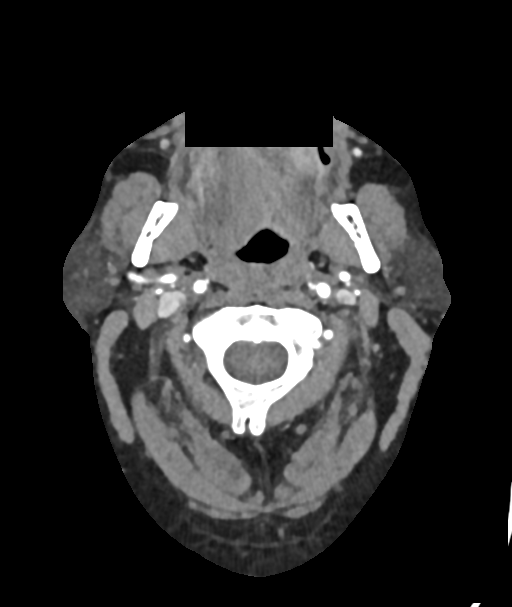
[im 237/355  bone]
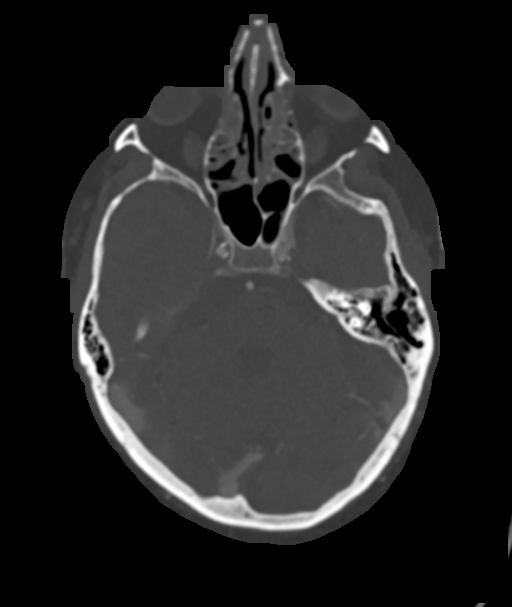
[im 296/355  soft-tissue]
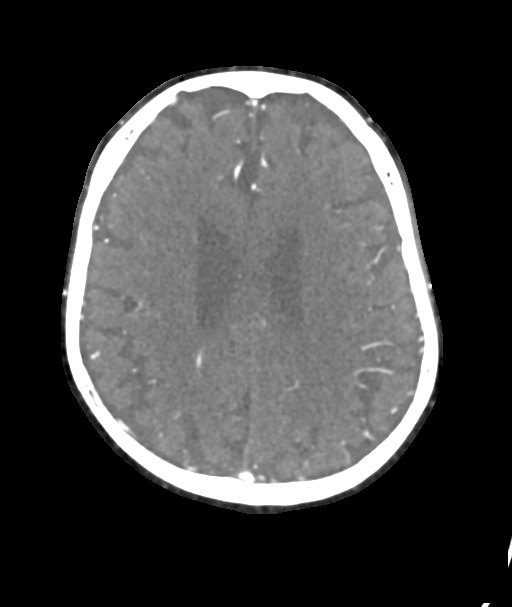

[5 of 33 positions shown; findings below may reference images not displayed]

FINDINGS: CT HEAD FINDINGS

Brain: There is no mass, hemorrhage or extra-axial collection. There
is generalized atrophy without lobar predilection. There are old
bilateral PCA territory infarcts. There is hypoattenuation of the
periventricular white matter, most commonly indicating chronic
ischemic microangiopathy.

Skull: The visualized skull base, calvarium and extracranial soft
tissues are normal.

Sinuses/Orbits: Moderate maxillary and ethmoid sinus mucosal
thickening. The orbits are normal.

CTA NECK FINDINGS

SKELETON: There is no bony spinal canal stenosis. No lytic or
blastic lesion.

OTHER NECK: Normal pharynx, larynx and major salivary glands. No
cervical lymphadenopathy. Unremarkable thyroid gland.

UPPER CHEST: No pneumothorax or pleural effusion. No nodules or
masses.

AORTIC ARCH:

There is mild calcific atherosclerosis of the aortic arch. There is
no aneurysm, dissection or hemodynamically significant stenosis of
the visualized portion of the aorta. Conventional 3 vessel aortic
branching pattern. The visualized proximal subclavian arteries are
widely patent.

RIGHT CAROTID SYSTEM: No dissection, occlusion or aneurysm. Mild
atherosclerotic calcification at the carotid bifurcation without
hemodynamically significant stenosis.

LEFT CAROTID SYSTEM: No dissection, occlusion or aneurysm. There is
mixed density atherosclerosis extending into the proximal ICA,
resulting in less than 50% stenosis.

VERTEBRAL ARTERIES: Left dominant configuration. There at least
moderate atherosclerotic stenosis of the right V1 segment. There is
otherwise no dissection, occlusion or flow-limiting stenosis to the
skull base (V1-V3 segments).

CTA HEAD FINDINGS

POSTERIOR CIRCULATION:

--Vertebral arteries: There is severe narrowing of the distal V4
segments, unchanged

--Inferior cerebellar arteries: Normal.

--Basilar artery: Normal.

--Superior cerebellar arteries: Normal.

--Posterior cerebral arteries (PCA): Normal.

ANTERIOR CIRCULATION:

--Intracranial internal carotid arteries: Atherosclerotic
calcification of the internal carotid arteries at the skull base
with moderate bilateral stenosis.

--Anterior cerebral arteries (ACA): Normal. Both A1 segments are
present. Patent anterior communicating artery (a-comm).

--Middle cerebral arteries (MCA): Normal.

VENOUS SINUSES: As permitted by contrast timing, patent.

ANATOMIC VARIANTS: None

Review of the MIP images confirms the above findings.
IMPRESSION: 1. No emergent large vessel occlusion.
2. Unchanged severe narrowing of the distal V4 segments of both
vertebral arteries.
3. Unchanged moderate stenosis of the intracranial internal carotid
arteries.
4. Unchanged old bilateral PCA territory infarcts and chronic
ischemic microangiopathy.
5. Aortic Atherosclerosis ([S6]-[S6]).

## 2019-08-27 MED ORDER — INSULIN ASPART 100 UNIT/ML ~~LOC~~ SOLN
0.0000 [IU] | SUBCUTANEOUS | Status: DC
  Administered 2019-08-28 – 2019-09-01 (×3): 1 [IU] via SUBCUTANEOUS

## 2019-08-27 MED ORDER — ASPIRIN EC 325 MG PO TBEC
325.0000 mg | DELAYED_RELEASE_TABLET | Freq: Every day | ORAL | Status: DC
  Administered 2019-08-27 – 2019-08-28 (×2): 325 mg via ORAL
  Filled 2019-08-27 (×2): qty 1

## 2019-08-27 MED ORDER — ASPIRIN EC 81 MG PO TBEC: 81.0000 mg | DELAYED_RELEASE_TABLET | Freq: Every day | ORAL | Status: DC

## 2019-08-27 MED ORDER — IOHEXOL 350 MG/ML SOLN
75.0000 mL | Freq: Once | INTRAVENOUS | Status: AC | PRN
  Administered 2019-08-27: 75 mL via INTRAVENOUS

## 2019-08-27 NOTE — Progress Notes (Signed)
SLP Cancellation Note  Patient Details Name: [name] MRN: [id number] DOB: [birth year]   Cancelled treatment:       Reason Eval/Treat Not Completed: Patient at procedure or test/unavailable.  Orders received and acknowledged for bedside swallow evaluation and cognitive-linguistic evaluation.  Patient was unavailable at this time as has was having an echocardiogram completed.  SLP will f/u for evaluations as schedule allows.     Shanon Rosser Kalman Nylen 08/27/2019, 10:59 AM

## 2019-08-27 NOTE — Consult Note (Signed)
Palliative Medicine Inpatient Consult Note  Reason for consult: Goals of Care "Patient with multiple recent strokes evaluate for change of CODE STATUS to DNR. Address future medical directives"   HPI:  Per intake H&P --> The patient is a 69 yr old man who carries a past medical history significant for CAD, history of stents, hyperlipidemia, and posterior circulation strokes. In April of 2021 the patient was diagnosed with a CVA of the PCA territory with chronic microhemorhages, as well as thalamic, brainstem, and cerebellar stroke. 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 He had 60% of the left ICA.  He was discharged on aspirin and plavix. He was readmitted to [facility] on 07/31/2019 for another posterior circulation stroke. His wife had stopped the ASA 2 weeks prior to this presentation after being told by a home health nurse that he should not be taking both ASA and Plavix. This time he was discharged to rehab on both aspiring and plavix. He was discharged to home and was doing well. He went to [facility] with his family. He again developed dysequilibrium, slurred speech, and difficulty walking. He was seen in an ED in [facility], but refused to stay because of the wait. He came here when he got home.  Palliative care was asked to get involved to help address code status.  Clinical Assessment/Goals of Care: I have reviewed medical records including EPIC notes, labs and imaging, received report from bedside RN, assessed the patient who was sitting up in bed eating a sandwich. His son, [name] was present and his wife, [name] came during our conversation.     I met with [name] (son), and [name] (wife) to further discuss diagnosis prognosis, GOC, EOL wishes, disposition and options.   I introduced Palliative Medicine as specialized medical care for people living with  serious illness. It focuses on providing relief from the symptoms and stress of a serious illness. The goal is to improve quality of life for both the patient and the family.  I asked [name] to tell me about himself. He states that he is from [place], [place]. He has lived here his whole life. He is married to his wife, [name] they have been together for 21 one years. 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 He gets enjoyment out of going to care shows. He has an old chevrolet from the 1930's that he adores. He and his wife also have a vacation home in [place] which they enjoy visiting. [name] does not consider himself to be a religious man.  I asked [name] how he was getting along at home. He shares that he has had a tough time over the past one year. He states that he has become more dependent on his wife to help him with things like making meals. He now is reliant on a cane and walker for mobility. He also complains that it has been difficult as he lost 50% of his vision with his second "major" stroke.   [name] is very tearful expressing how difficult things have been for him. I utilized reflective listening during our discussions together.  [name] son, [name] interjected to provide some additional information. [name] shares that over the last year [name] has experienced some hard times. [name] shares that it all started when he was diagnosed last christmas time with COVID and C.Diff. He states that after this [name] started  having one stroke after the other.   We talked about his underlying diease's and how these can leave [name] at risk for stokes. [name] and his son vocalized wanting to understand better why these keep happening. They vocalize hoping to gain some answers during this hospitalization.   A detailed discussion was had today regarding advanced directives, per [name] and [name] they have never completed these.  They expressed interest in filling them out while [name] is  hospitalized.  Concepts specific to code status, artifical feeding and hydration, continued IV antibiotics and rehospitalization was had.  I introduced the MOST form. I shared the concerns for trauma and burden as opposed to benefit with CPR/Intubation/Shocks. At this point they have elected to discuss this as a family prior to making any changes.   Discussed the importance of continued conversation with family and their  medical providers regarding overall plan of care and treatment options, ensuring decisions are within the context of the patients values and GOCs.  Provided "Hard Choices for [employer]" booklet.   Decision Maker: Patient presently is his own decisions maker  SUMMARY OF RECOMMENDATIONS   Full Code - Family is reviewing Hard Choices for [employer] book and MOST form, we will talk more about these tomorrow  Chaplain --> Advanced Directive  Ongoing PMT support patient would benefit from being seen in the community  Code Status/Advance Care Planning: FULL CODE   Palliative Prophylaxis:   Oral Care, Mobility   Additional Recommendations (Limitations, Scope, Preferences):  Continue current scope of care  Psycho-social/Spiritual:   Desire for further Chaplaincy support: Yes for advanced directives  Additional Recommendations: Education on serious illness and advanced care planning   Prognosis: Unclear  Discharge Planning: Per review of PT notes to discharge home with 24/7 supervision.   PPS: 60%   This conversation/these recommendations were discussed with patient primary care team, Dr. Sherral Hammers  Time In: 0950 Time Out: 1000 Total Time: 51 Greater than 50%  of this time was spent counseling and coordinating care related to the above assessment and plan.  [place] Team Team Cell Phone: [phone] Please utilize secure chat with additional questions, if there is no response within 30 minutes please call the  above phone number  Palliative Medicine Team providers are available by phone from 7am to 7pm daily and can be reached through the team cell phone.  Should this patient require assistance outside of these hours, please call the patient's attending physician.

## 2019-08-27 NOTE — Evaluation (Signed)
Speech Language Pathology Evaluation Patient Details Name: [name] MRN: [id number] DOB: Jun 27, 1950 Today's Date: 08/27/2019 Time: 5400-8676 SLP Time Calculation (min) (ACUTE ONLY): 17 min  Problem List:  Patient Active Problem List   Diagnosis Date Noted  . 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/27/2019  . Palliative care by specialist   . Goals of care, counseling/discussion   . Stroke (HCC) 08/26/2019  . Slurred speech 08/26/2019  . Dyslipidemia 08/26/2019  . CKD (chronic kidney disease), stage III 08/26/2019  . DM II (diabetes mellitus, type II), controlled (HCC) 08/26/2019  . Cerebrovascular disease 08/26/2019   Past Medical History:  Past Medical History:  Diagnosis Date  . HLD (hyperlipidemia)   . MI (myocardial infarction) (HCC)   . Stroke [facility])    Past Surgical History:  Past Surgical History:  Procedure Laterality Date  . 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 Pt was admitted back in April with CVA of PCA territory as well and then again on 6/27. Pt began to experience dysequilibrium, slurred speech and difficulty walking while in [facility], went to ED but too long of a wait so came to [facility] when they came back.    Assessment / Plan / Recommendation Clinical Impression  Patient was seen for a cognitive-linguistic evaluation in the setting of acute infarcts.  Per chart review, pt has a hx of cognitive deficits in orientation (time/place/situation), immediate/delayed recall, calculations, problem solving, and reasoning in addition to a hx of mild dysarthria.  Patient reported that he lives with his wife and that she is responsible for most of his IADLs, including medications, finances, cooking, cleaning, etc.  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  Patient completed portions of the [facility] SLUMS (he was unable to complete the  full evaluation secondary to vision deficits) and he exhibited deficits in orientation, short-term & working memory, numeric problem solving, thought organization, and sustained attention.  Patient was oriented to himself, the place, state, and situation, but he was not oriented to the day or year.  He was observed to have mild anomia in conversational speech; otherwise expressive and receptive language appeared to be functional.  The pt's speech was also noted to be dysarthric, c/b reduced rate of speech and reduced intelligibility at the word, phrase, and conversational level.  His speech was approximately 75%-80% intelligible to an unfamiliar listener.  Patient stated that he did not notice any change in his speech, but he reported that his wife had noticed a significant change.  Recommend additional ST targeting cognitive-linguistic deficits at time of discharge (home health vs outpatient).  Additionally recommend that pt continue to receive assistance with IADLs.  SLP educated pt regarding results and recommendations and he verbalized understanding.  SLP will f/u per POC.       SLP Assessment  SLP Recommendation/Assessment: Patient needs continued Speech Lanaguage Pathology Services SLP Visit Diagnosis: Cognitive communication deficit (R41.841);Dysarthria and anarthria (R47.1)    Follow Up Recommendations  Home health SLP;Outpatient SLP    Frequency and Duration min 2x/week  2 weeks      SLP Evaluation Cognition  Overall Cognitive Status: Impaired/Different from baseline Arousal/Alertness: Awake/alert Orientation Level: Oriented to person;Oriented to place;Oriented to time;Oriented to situation;Other (comment) (Not oriented to the date or year ) Attention: Focused;Sustained Focused Attention: Appears intact Sustained Attention: Impaired Sustained Attention  Impairment: Verbal basic Memory: Impaired Memory Impairment: Decreased short term memory;Decreased recall of new  information Decreased Short Term Memory: Verbal basic;Functional basic Awareness: Impaired Awareness Impairment: Emergent impairment Problem Solving: Impaired Problem Solving Impairment: Verbal basic;Verbal complex;Functional complex Safety/Judgment: Appears intact       Comprehension  Auditory Comprehension Overall Auditory Comprehension: Appears within functional limits for tasks assessed Yes/No Questions: Within Functional Limits    Expression Expression Primary Mode of Expression: Verbal Verbal Expression Overall Verbal Expression: Impaired Level of Generative/Spontaneous Verbalization: Conversation Repetition: No impairment Naming: Impairment Responsive: Not tested Confrontation: Within functional limits Convergent: Not tested Divergent: 25-49% accurate Other Naming Comments: Mild anomia observed in conversational speech    Oral / Motor  Oral Motor/Sensory Function Overall Oral Motor/Sensory Function: Mild impairment Facial ROM: Reduced right Facial Symmetry: Within Functional Limits Lingual ROM: Within Functional Limits Lingual Symmetry: Within Functional Limits Motor Speech Overall Motor Speech: Impaired Respiration: Within functional limits Phonation: Normal Resonance: Within functional limits Articulation: Impaired Level of Impairment: Word Intelligibility: Intelligibility reduced Word: 75-100% accurate Phrase: 75-100% accurate Sentence: 75-100% accurate Conversation: 75-100% accurate Motor Planning: Witnin functional limits Motor Speech Errors: Not applicable   GO                   Villa Herb M.S., CCC-SLP Acute Rehabilitation Services Office: [phone]  Shanon Rosser [facility] 08/27/2019, 3:51 PM

## 2019-08-27 NOTE — Progress Notes (Addendum)
PROGRESS NOTE    [name]  ZOX:[id number] DOB: Jan 01, 1951 DOA: 08/26/2019 PCP: Katherine Bassetowen, Christopher, PA-C     Brief Narrative:  69 yr old WM PMHx CAD, history of stents, HLD, Posterior circulation CVA . In April of 2021 the patient was diagnosed with a CVA of the PCA territory with chronic microhemorhages, as well as thalamic, brainstem, and cerebellar stroke. 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 He had 60% of the left ICA.  He was discharged on aspirin and plavix. He was readmitted to [facility] on 07/31/2019 for another posterior circulation stroke. His wife had stopped the ASA 2 weeks prior to this presentation after being told by a home health nurse that he should not be taking both ASA and Plavix. This time he was discharged to rehab on both aspiring and plavix. He was discharged to home and was doing well. [place] [place] [place] He again developed dysequilibrium, slurred speech, and difficulty walking. [place] [facility], but refused to stay because of the wait. He came here when he got home.  In the ED the patient was found to have normal vital signs. Creatinine was 1.60, and a normal CBC.  CT head demonstrated signs of prior infarct, atrophy, and chronic microvascular ischemic changes, and bilateral maxillary sinus disease.  MRI Brain demonstrated acute infarcts of the bilateral occipital lobes and anterior left pons. There was also evidence of remote infarcts involving the bilateral occipital lobes, bilateral cerebellum, dorsal left midbrain, bilateral centrum semiovale, bilateral thalami, and left frontal white matter. 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  Neurology was called from the ED and recommended admission and repeat vessel imaging to rule out an acute thrombus and to rule out  other causes . The patient also should be admitted to monitor for worsening of symptoms.  The patient denies fevers, chills, nausea, vomiting, cough, chest pain, shortness of breath. He denies hematemesis, melena, hematochezia, or coffee ground emesis.     Subjective: A/O x3 (does not know when), negative CP, negative abdominal pain.  Positive slurred speech.   Assessment & Plan: Covid vaccination; negative vaccination   Active Problems:   Stroke (HCC)   Slurred speech   Dyslipidemia   CKD (chronic kidney disease), stage III   DM II (diabetes mellitus, type II), controlled (HCC)   Cerebrovascular disease  Acute on Chronic CVA -Acute infarct involving bilateral occipital lobes and anterior LEFT pons.  Embolic?  Source unknown. -CTA head/neck pending -Echocardiogram pending   Essential HTN -Allow permissive HTN -Coreg 3.125 mg BID (hold)  HLD -Atorvastatin 80 mg daily -Lipid panel pending  DM type II uncontrolled with complication -Hemoglobin A1c pending -Very sensitive SSI  CKD stage IIIa (baseline Cr 1.56 no previous creatinine for comparison) -Hold all nephrotoxic medication -Monitor closely Recent Labs  Lab 08/26/19 0658 08/26/19 0726 08/26/19 1911  CREATININE 1.56* 1.60* 1.47*       Goals of care -7/24; Palliative Care Consult; patient with multiple recent strokes evaluate for change of CODE STATUS to DNR. Address future medical directives   DVT prophylaxis: Subcu heparin Code Status: Full Family Communication:  Status is: Inpatient    Dispo: The patient is from: Home              Anticipated d/c is to: CIR vs SNF  Anticipated d/c date is: 7/30              Patient currently unstable      Consultants:  Stroke team  Procedures/Significant Events:  CTA head/neck pending    I have personally reviewed and interpreted all radiology studies and my findings are as above.  VENTILATOR  SETTINGS:    Cultures   Antimicrobials:    Devices    LINES / TUBES:      Continuous Infusions: . sodium chloride Stopped (08/27/19 0042)     Objective: Vitals:   08/27/19 0043 08/27/19 0313 08/27/19 0347 08/27/19 0359  BP: (!) 156/93  (!) 130/104 (!) 141/92  Pulse:   91 (!) 43  Resp:   13 16  Temp: 97.8 F (36.6 C)  97.8 F (36.6 C)   TempSrc: Oral  Oral   SpO2:    96%  Weight:  (!) 108.9 kg    Height:  5\' 10"  (1.778 m)     No intake or output data in the 24 hours ending 08/27/19 0735 Filed Weights   08/27/19 0313  Weight: (!) 108.9 kg    Examination:  General: A/O x3 (does not know when) No acute respiratory distress Eyes: negative scleral hemorrhage, negative anisocoria, negative icterus ENT: Negative Runny nose, negative gingival bleeding, LEFT temporal hemianopsia Neck:  Negative scars, masses, torticollis, lymphadenopathy, JVD Lungs: Clear to auscultation bilaterally without wheezes or crackles Cardiovascular: Regular rate and rhythm without murmur gallop or rub normal S1 and S2 Abdomen: negative abdominal pain, nondistended, positive soft, bowel sounds, no rebound, no ascites, no appreciable mass Extremities: No significant cyanosis, clubbing, or edema bilateral lower extremities Skin: Negative rashes, lesions, ulcers Psychiatric:  Negative depression, negative anxiety, negative fatigue, negative mania  Central nervous system:  Cranial nerves II through XII intact, tongue/uvula midline, LEFT extremities muscle strength 5/5, RIGHT extremity muscle strength 4/5, sensation intact throughout, finger nose finger LEFT within normal limits, unable to perform on the RIGHT.  Quick finger touch bilateral within normal limits, positive mild dysarthria, negative expressive aphasia, negative receptive aphasia.  .     Data Reviewed: Care during the described time interval was provided by me .  I have reviewed this patient's available data, including medical  history, events of note, physical examination, and all test results as part of my evaluation.  CBC: Recent Labs  Lab 08/26/19 0658 08/26/19 0726 08/26/19 1911  WBC 7.9  --  6.9  NEUTROABS 5.4  --   --   HGB 14.0 13.6 14.4  HCT 40.9 40.0 42.2  MCV 95.1  --  94.8  PLT 281  --  278   Basic Metabolic Panel: Recent Labs  Lab 08/26/19 0658 08/26/19 0726 08/26/19 1911  NA 138 142  --   K 3.7 3.6  --   CL 103 102  --   CO2 25  --   --   GLUCOSE 163* 160*  --   BUN 16 18  --   CREATININE 1.56* 1.60* 1.47*  CALCIUM 9.4  --   --    GFR: Estimated Creatinine Clearance: 59.5 mL/min (A) (by C-G formula based on SCr of 1.47 mg/dL (H)). Liver Function Tests: Recent Labs  Lab 08/26/19 0658  AST 23  ALT 22  ALKPHOS 106  BILITOT 0.8  PROT 7.4  ALBUMIN 3.4*   No results for input(s): LIPASE, AMYLASE in the last 168 hours. No results for input(s): AMMONIA in the last 168 hours. Coagulation Profile: Recent Labs  Lab  08/26/19 0658  INR 1.0   Cardiac Enzymes: No results for input(s): CKTOTAL, CKMB, CKMBINDEX, TROPONINI in the last 168 hours. BNP (last 3 results) No results for input(s): PROBNP in the last 8760 hours. HbA1C: No results for input(s): HGBA1C in the last 72 hours. CBG: Recent Labs  Lab 08/26/19 1411  GLUCAP 116*   Lipid Profile: No results for input(s): CHOL, HDL, LDLCALC, TRIG, CHOLHDL, LDLDIRECT in the last 72 hours. Thyroid Function Tests: No results for input(s): TSH, T4TOTAL, FREET4, T3FREE, THYROIDAB in the last 72 hours. Anemia Panel: No results for input(s): VITAMINB12, FOLATE, FERRITIN, TIBC, IRON, RETICCTPCT in the last 72 hours. Sepsis Labs: No results for input(s): PROCALCITON, LATICACIDVEN in the last 168 hours.  Recent Results (from the past 240 hour(s))  SARS Coronavirus 2 by RT PCR (hospital order, performed in [facility] hospital lab) Nasopharyngeal Nasopharyngeal Swab     Status: None   Collection Time: 08/26/19  3:00 PM   Specimen:  Nasopharyngeal Swab  Result Value Ref Range Status   SARS Coronavirus 2 NEGATIVE NEGATIVE Final    Comment: (NOTE) SARS-CoV-2 target nucleic acids are NOT DETECTED.  The SARS-CoV-2 RNA is generally detectable in upper and lower respiratory specimens during the acute phase of infection. The lowest concentration of SARS-CoV-2 viral copies this assay can detect is 250 copies / mL. A negative result does not preclude SARS-CoV-2 infection and should not be used as the sole basis for treatment or other patient management decisions.  A negative result may occur with improper specimen collection / handling, submission of specimen other than nasopharyngeal swab, presence of viral mutation(s) within the areas targeted by this assay, and inadequate number of viral copies (<250 copies / mL). A negative result must be combined with clinical observations, patient history, and epidemiological information.  Fact Sheet for Patients:   BoilerBrush.com.cy  Fact Sheet for Healthcare Providers: https://pope.com/  This test is not yet approved or  cleared by the [place] FDA and has been authorized for detection and/or diagnosis of SARS-CoV-2 by FDA under an Emergency Use Authorization (EUA).  This EUA will remain in effect (meaning this test can be used) for the duration of the COVID-19 declaration under Section 564(b)(1) of the Act, 21 U.S.C. section 360bbb-3(b)(1), unless the authorization is terminated or revoked sooner.  Performed at [facility], [address] [place], [place] [address]          Radiology Studies: CT HEAD WO CONTRAST  Result Date: 08/26/2019 CLINICAL DATA:  Neuro deficit. EXAM: CT HEAD WITHOUT CONTRAST TECHNIQUE: Contiguous axial images were obtained from the base of the skull through the vertex without intravenous contrast. COMPARISON:  MRI of the brain of August 01, 2019 FINDINGS: Brain: No evidence of acute  infarction, hemorrhage, hydrocephalus, extra-axial collection or mass lesion/mass effect. Infarcts exhibited recent MRI not well seen on today's study with evidence of remote infarct in the bilateral occipital poles but without signs of acute intracranial process. Vascular: No hyperdense vessel or unexpected calcification. Skull: Normal. Negative for fracture or focal lesion. Sinuses/Orbits: Bilateral maxillary sinus disease, new since previous imaging worse on the RIGHT. Other: None. IMPRESSION: 1. No acute intracranial abnormality. Signs of prior infarct, atrophy and chronic microvascular ischemic change. 2. Infarcts exhibited recent MRI not well seen on today's study with evidence of remote infarct in the bilateral occipital poles but without signs of acute intracranial process. 3. Bilateral maxillary sinus disease, new since previous imaging worse on the RIGHT. Electronically Signed   By:  Donzetta Kohut M.D.   On: 08/26/2019 08:32   MR BRAIN WO CONTRAST  Result Date: 08/26/2019 CLINICAL DATA:  Neuro deficit. EXAM: MRI HEAD WITHOUT CONTRAST TECHNIQUE: Multiplanar, multiecho pulse sequences of the brain and surrounding structures were obtained without intravenous contrast. COMPARISON:  08/26/2019 head CT.  08/31/2019 MRI head. FINDINGS: Brain: Multifocal restricted diffusion and correlative T2 hyperintense signal involving the bilateral occipital lobes and anterior left pons. Tiny remote microhemorrhages involving the bilateral cerebellum. Moderate diffuse parenchymal volume loss. Sequela of remote bilateral occipital and cerebellar infarcts. Smaller remote infarcts are seen involving the dorsal left midbrain, bilateral centrum semiovale, bilateral thalami and left frontal white matter. No midline shift, mass lesion or extra-axial fluid collection. Vascular: FLAIR hyperintense signal within the right V4 segment may reflect slow flow versus occlusion, unchanged from prior exam. Skull and upper cervical spine:  Normal marrow signal. Sinuses/Orbits: Normal orbits. Mild-to-moderate pansinus mucosal thickening with layering bilateral maxillary secretions. Trace left mastoid effusion. Other: None. IMPRESSION: Acute infarcts involving the bilateral occipital lobes and anterior left pons. Remote infarcts involving the bilateral occipital lobes, bilateral cerebellum, dorsal left midbrain, bilateral centrum semiovale, bilateral thalami and left frontal white matter. 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 These results were called by telephone at the time of interpretation on 08/26/2019 at 5:08 pm to provider Dr. Fredderick Phenix, who verbally acknowledged these results. Electronically Signed   By: Stana Bunting M.D.   On: 08/26/2019 17:11        Scheduled Meds: .  stroke: mapping our early stages of recovery book   Does not apply Once  . amLODipine  2.5 mg Oral Daily  . aspirin EC  81 mg Oral Daily  . atorvastatin  80 mg Oral q1800  . carvedilol  3.125 mg Oral BID WC  . clopidogrel  75 mg Oral Daily  . febuxostat  40 mg Oral Daily  . FLUoxetine  10 mg Oral Daily  . heparin  5,000 Units Subcutaneous Q8H  . multivitamin with minerals  1 tablet Oral Daily  . sodium chloride flush  3 mL Intravenous Once   Continuous Infusions: . sodium chloride Stopped (08/27/19 0042)     LOS: 0 days    Time spent:40 min    Dorotea Hand, Roselind Messier, MD [facility] Pager [phone]  If 7PM-7AM, please contact night-coverage [email or web address] Password [facility] 08/27/2019, 7:35 AM

## 2019-08-27 NOTE — Plan of Care (Signed)
  Problem: Education: Goal: Knowledge of General Education information will improve Description: Including pain rating scale, medication(s)/side effects and non-pharmacologic comfort measures Outcome: Progressing   Problem: Health Behavior/Discharge Planning: Goal: Ability to manage health-related needs will improve Outcome: Progressing   Problem: Clinical Measurements: Goal: Ability to maintain clinical measurements within normal limits will improve Outcome: Progressing Goal: Will remain free from infection Outcome: Progressing Goal: Diagnostic test results will improve Outcome: Progressing Goal: Respiratory complications will improve Outcome: Progressing Goal: Cardiovascular complication will be avoided Outcome: Progressing   Problem: Activity: Goal: Risk for activity intolerance will decrease Outcome: Progressing   Problem: Nutrition: Goal: Adequate nutrition will be maintained Outcome: Progressing   Problem: Coping: Goal: Level of anxiety will decrease Outcome: Progressing   Problem: Elimination: Goal: Will not experience complications related to bowel motility Outcome: Progressing Goal: Will not experience complications related to urinary retention Outcome: Progressing   Problem: Pain Managment: Goal: General experience of comfort will improve Outcome: Progressing   Problem: Skin Integrity: Goal: Risk for impaired skin integrity will decrease Outcome: Progressing   Problem: Safety: Goal: Ability to remain free from injury will improve Outcome: Progressing   Problem: Education: Goal: Knowledge of disease or condition will improve Outcome: Progressing Goal: Knowledge of secondary prevention will improve Outcome: Progressing Goal: Knowledge of patient specific risk factors addressed and post discharge goals established will improve Outcome: Progressing

## 2019-08-27 NOTE — Progress Notes (Signed)
STROKE TEAM PROGRESS NOTE   INTERVAL HISTORY His wife and son are at the bedside.  Pt reclining in bed, still has dysarthria and right hand weakness with b/l mild dysmetria. CTA head and neck showed right VA occlusion and left VBJ near occlusion. BA patent.   As per family, pt had stroke in 05/2019 and 07/2019 in [facility], with residue visual deficit and memory impairment. They were not told the etiology of the stroke. As per note, pt had posterior stroke both times and CTA head and neck showed the similar findings as of today. I proposed cerebral angiogram to further evaluate posterior circulation and family and pt are willing to proceed.   OBJECTIVE Vitals:   08/27/19 0043 08/27/19 0313 08/27/19 0347 08/27/19 0359  BP: (!) 156/93  (!) 130/104 (!) 141/92  Pulse:   91 (!) 43  Resp:   13 16  Temp: 97.8 F (36.6 C)  97.8 F (36.6 C)   TempSrc: Oral  Oral   SpO2:    96%  Weight:  (!) 108.9 kg    Height:  5\' 10"  (1.778 m)      CBC:  Recent Labs  Lab 08/26/19 0658 08/26/19 0658 08/26/19 0726 08/26/19 1911  WBC 7.9  --   --  6.9  NEUTROABS 5.4  --   --   --   HGB 14.0   < > 13.6 14.4  HCT 40.9   < > 40.0 42.2  MCV 95.1  --   --  94.8  PLT 281  --   --  278   < > = values in this interval not displayed.    Basic Metabolic Panel:  Recent Labs  Lab 08/26/19 0658 08/26/19 0658 08/26/19 0726 08/26/19 1911  NA 138  --  142  --   K 3.7  --  3.6  --   CL 103  --  102  --   CO2 25  --   --   --   GLUCOSE 163*  --  160*  --   BUN 16  --  18  --   CREATININE 1.56*   < > 1.60* 1.47*  CALCIUM 9.4  --   --   --    < > = values in this interval not displayed.    Lipid Panel: No results found for: CHOL, TRIG, HDL, CHOLHDL, VLDL, LDLCALC HgbA1c: No results found for: HGBA1C Urine Drug Screen: No results found for: LABOPIA, COCAINSCRNUR, LABBENZ, AMPHETMU, THCU, LABBARB  Alcohol Level No results found for: ETH  IMAGING  CT HEAD WO  CONTRAST 08/26/2019 IMPRESSION:  1. No acute intracranial abnormality. Signs of prior infarct, atrophy and chronic microvascular ischemic change.  2. Infarcts exhibited recent MRI not well seen on today's study with evidence of remote infarct in the bilateral occipital poles but without signs of acute intracranial process.  3. Bilateral maxillary sinus disease, new since previous imaging worse on the RIGHT.    MR BRAIN WO CONTRAST 08/26/2019 IMPRESSION:  Acute infarcts involving the bilateral occipital lobes and anterior left pons. Remote infarcts involving the bilateral occipital lobes, bilateral cerebellum, dorsal left midbrain, bilateral centrum semiovale, bilateral thalami and left frontal white matter. 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   ECG - SR rate 83 BPM. (See cardiology reading for complete details)  ECHOCARDIOGRAM COMPLETE IMPRESSIONS  1. Left ventricular ejection fraction, by estimation, is 45 to 50%. The left ventricle has mildly decreased function. The left  ventricle demonstrates global hypokinesis. There is mild concentric left ventricular hypertrophy. Left ventricular diastolic parameters are indeterminate.  2. Right ventricular systolic function is normal. The right ventricular size is normal.  3. The mitral valve is normal in structure. Trivial mitral valve regurgitation. No evidence of mitral stenosis.  4. The aortic valve is tricuspid. Aortic valve regurgitation is mild.  5. The inferior vena cava is normal in size with greater than 50% respiratory variability, suggesting right atrial pressure of 3 mmHg. Conclusion(s)/Recommendation(s): No intracardiac source of embolism detected on this transthoracic study.   PHYSICAL EXAM  Temp:  [97.8 F (36.6 C)-98.5 F (36.9 C)] 98.5 F (36.9 C) (07/24 0754) Pulse Rate:  [43-91] 43 (07/24 0359) Resp:  [13-17] 17 (07/24 0754) BP: (106-157)/(61-104) 130/72 (07/24 0754) SpO2:  [96 %-100 %] 96 %  (07/24 0359) Weight:  [108.9 kg] 108.9 kg (07/24 0313)  General - Well nourished, well developed, in no apparent distress.  Ophthalmologic - fundi not visualized due to noncooperation.  Cardiovascular - Regular rhythm and rate.  Neuro - awake, alert, orientated to time place and people.  No aphasia, however moderate dysarthria.  Follows simple commands, able to name and repeat.  Visual field grossly full, however decreased visual acuity bilaterally.  Extraocular movement grossly intact, denies diplopia.  Right nasolabial fold flattening, tongue midline.  Right upper extremity 4/5 with pronator drift.  Right upper extremity and bilateral lower extremity 5/5.  Bilateral finger-to-nose intact, however bilateral heel-to-shin mild dysmetria.  Sensation symmetrical.  Gait not tested.   ASSESSMENT/PLAN [name] is a 69 y.o. male with history of HLD (hyperlipidemia), MI (myocardial infarction) (HCC), and prior Strokes (HCC) who presents with dysarthria.  He did not receive IV t-PA.  Strokes: Acute infarcts involving the bilateral occipital lobes and anterior left pons - large vessel disease due to VA stenosis/occlusion  CT head -  No acute intracranial abnormality  MRI head - Acute infarcts involving the bilateral occipital lobes and anterior left pons. Remote infarcts involving the bilateral occipital lobes, bilateral cerebellum, dorsal left midbrain, bilateral centrum semiovale, bilateral thalami and left frontal white matter. 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   CTA head and neck - right V4 occluded. Left VBJ high grade stenosis, BA patent  Cerebral angio pending  2D Echo - EF 45-50%  Sars Corona Virus 2 - negative  LDL - 55  HgbA1c 6.2  VTE prophylaxis - [place] Heparin  aspirin 81 mg daily and clopidogrel 75 mg daily prior to admission, now on clopidogrel 75 mg daily and ASA 325. Continue DAPT for 3 months and then plavix alone.  Patient  counseled to be compliant with his antithrombotic medications  Ongoing aggressive stroke risk factor management  Therapy recommendations:  HH PT  Disposition:  Pending  Recurrent posterior infarcts  05/2019 MRI bilateral occipital PCA infarcts, CTA head and neck positive for Severe stenosis of the bilateral VBJ, and moderate to severe stenoses of both proximal Vertebral Arteries.  60% stenosis Left ICA bulb due to soft plaque. Moderate bilateral. ICA siphon stenosis due to bulky plaque. Mild left MCA M1 stenosis. Mild to moderate ACA A2 stenoses.  07/2019 Multiple acute infarcts of the cerebellumand occipital lobes  bilaterally as well as the left thalamus and posterior left mid brain. CTA H&N- June 2021- severe bilateral vertebrobasilar junction stenosis and advanced atherosclerotic changes elsewhere in the head and neck including moderate to severe bilateral vertebral artery origin stenosis, 60% stenosis left ICA bulb.  Pending cerebral angio for better assessment  May consider VBJ stenting if needed.  Hypertension  Home BP meds: Norvasc ; Coreg  Current BP meds: Norvasc ; Coreg  Stable . Permissive hypertension (OK if < 220/120) but gradually normalize in 5-7 days  . Long-term BP goal 130-160 give posterior stenosis  Hyperlipidemia  Home Lipid lowering medication: Lipitor 80 mg daily  LDL - 55 goal < 70  Current lipid lowering medication: Lipitor 80 mg daily   Continue statin at discharge  Other Stroke Risk Factors  Advanced age  Previous ETOH use.  Obesity, Body mass index is 34.44 kg/m., recommend weight loss, diet and exercise as appropriate   Coronary artery disease  Other Active Problems  Code status - Full code  CKD - stage 3a - creatinine - 1.56->1.60->1.47->1.35   Hospital day # 0  Marvel Plan, MD PhD Stroke Neurology 08/27/2019 11:53 PM    To contact Stroke Continuity provider, please refer to [email or web address]. After hours, contact General  Neurology

## 2019-08-27 NOTE — Evaluation (Signed)
Clinical/Bedside Swallow Evaluation Patient Details  Name: [name] MRN: [id number] Date of Birth: 10-15-1950  Today's Date: 08/27/2019 Time: SLP Start Time (ACUTE ONLY): 1440 SLP Stop Time (ACUTE ONLY): 1458 SLP Time Calculation (min) (ACUTE ONLY): 18 min  Past Medical History:  Past Medical History:  Diagnosis Date  . HLD (hyperlipidemia)   . MI (myocardial infarction) (HCC)   . Stroke [facility])    Past Surgical History:  Past Surgical History:  Procedure Laterality Date  . 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 Pt was admitted back in April with CVA of PCA territory as well and then again on 6/27. Pt began to experience dysequilibrium, slurred speech and difficulty walking while in [facility], went to ED but too long of a wait so came to [facility] when they came back.   Assessment / Plan / Recommendation Clinical Impression  Patient was seen for a bedside swallow evaluation in the setting of acute infarcts.  Per chart review, pt was seen for a BSE on 08/04/2019 at [facility] and he did not exhibit clinical s/sx of aspiration with any consistencies.  Pt reported that he has not experienced any coughing, choking, or difficulty with diet consumption since this most recent admission.  Oral mechanism examination was remarkable for reduced facial/labial ROM on the R and bottom dentures.  Pt consumed trials of thin liquid, nectar-thick liquid, puree, and regular solids.  Pt exhibited good labial closure around the spoon and straw, and AP transport appeared to be timely with all trials.  Suspect intermittently delayed swallow initiation with thin liquids.  Pt exhibited a delayed throat clear following 4/7 trials of thin liquid and he exhibited an immediate cough following 1/7 trials.  No overt s/sx of aspiration were observed with nectar-thick liquid, puree, or regular solids.  Of note,  pt was observed to have a baseline throat clear upon SLP arrival in the absence of PO intake.  Recommend a modified barium swallow study to further evalaute swallow function.  Discussed changing the pt's liquids to nectar-thick liquid until MBS can be completed; however he reported a strong dislike of them.  Given that s/sx of aspiration were sporadic and that pt was noted to have a strong throat clear and cough, will allow the pt to remain on thin liquids until the MBS can be completed (plan for tomorrow pending radiology schedule).  If MBS is not able to be complete tomorrow, will reassess clinically tomorrow and likely change liquids to nectar-thick.  Pt was in agreement with this plan.  Therefore, recommend continuation of regular solids and thin liquid with medications administered whole in puree and intermittent supervision to cue for compensatory strategies (listed below).  SLP will f/u per POC.    SLP Visit Diagnosis: Dysphagia, unspecified (R13.10)    Aspiration Risk  Mild aspiration risk    Diet Recommendation Regular;Thin liquid   Liquid Administration via: Cup;Straw Medication Administration: Whole meds with puree Supervision: Intermittent supervision to cue for compensatory strategies Compensations: Minimize environmental distractions;Slow rate;Small sips/bites Postural Changes: Seated upright at 90 degrees    Other  Recommendations Oral Care Recommendations: Oral care BID   Follow up Recommendations  (TBD)      Frequency and Duration min 2x/week  2 weeks       Prognosis Prognosis for Safe Diet Advancement: Fair      Swallow Study   General  HPI: [name] presenting with acute infarcts of billateral occiptal lobes and anterior L pons. Pt was admitted back in april with CVA of PCA territory as well and then again on 6/27. Pt began to experience dysequilibrium, slurred speeach and difficulty walking while in [facility], went to ED but too long of a wait so came to  [facility] when they came back. t is a 69 yo male presenting with acute infarcts of billateral occiptal lobes and anterior L pons. Pt was admitted back in april with CVA of PCA territory as well and then again on 6/27. Pt began to experience dysequilibrium, slurred speeach and difficulty walking while in [facility], went to ED but too long of a wait so came to [facility] when they came back.  Type of Study: Bedside Swallow Evaluation Previous Swallow Assessment: BSE on 08/04/2019 at outside hospital with recommendations for regular solid and thin liquids  Diet Prior to this Study: Regular;Thin liquids Temperature Spikes Noted: No Respiratory Status: Room air History of Recent Intubation: No Behavior/Cognition: Alert;Cooperative;Pleasant mood Oral Cavity Assessment: Within Functional Limits Oral Care Completed by SLP: No Oral Cavity - Dentition: Dentures, bottom Vision: Functional for self-feeding Self-Feeding Abilities: Able to feed self;Needs set up Patient Positioning: Upright in bed Baseline Vocal Quality: Normal Volitional Cough: Strong Volitional Swallow: Able to elicit    Oral/Motor/Sensory Function Overall Oral Motor/Sensory Function: Mild impairment Facial ROM: Reduced right Facial Symmetry: Within Functional Limits Lingual ROM: Within Functional Limits Lingual Symmetry: Within Functional Limits   Ice Chips Ice chips: Not tested   Thin Liquid Thin Liquid: Impaired Presentation: Straw;Cup Pharyngeal  Phase Impairments: Throat Clearing - Delayed;Cough - Immediate    Nectar Thick Nectar Thick Liquid: Within functional limits Presentation: Cup;Straw   Honey Thick Honey Thick Liquid: Not tested   Puree Puree: Within functional limits   Solid     Solid: Within functional limits     Villa Herb., M.S., CCC-SLP Acute Rehabilitation Services Office: [phone]   Shanon Rosser Markia Kyer 08/27/2019,3:34 PM

## 2019-08-27 NOTE — Progress Notes (Signed)
  Echocardiogram 2D Echocardiogram has been performed.  Tyjay Galindo G Claudine Stallings 08/27/2019, 11:51 AM

## 2019-08-27 NOTE — Evaluation (Signed)
Occupational Therapy Evaluation Patient Details Name: [name] MRN: [id number] DOB: Feb 27, 1950 Today's Date: 08/27/2019    History of Present Illness [name] presenting with acute infarcts of bil occiptal lobes and anterior L pons. Pt was admitted back in april with CVA of PCA territory as well and then again on 6/27. Pt began to experience dysequilibrium, slurred speeach and difficulty walking while in [facility], went to ED but too long of a wait so came to [facility] when they came back.   Clinical Impression   Pt admitted with above. He demonstrates the below listed deficits and will benefit from continued OT to maximize safety and independence with BADLs.  Pt presents to OT with generalized weakness, impaired balance, impaired vision, and cognitive deficits.  He requires min A, overall for ADLs and min A - min guard assist for functional mobility. He lives with his wife and required supervision PTA.  Recommend HHOT at discharge.       Follow Up Recommendations  Home health OT;Supervision/Assistance - 24 hour    Equipment Recommendations  None recommended by OT    Recommendations for Other Services       Precautions / Restrictions Precautions Precautions: Fall Precaution Comments: 2 hospital stays since april      Mobility Bed Mobility Overal bed mobility: Needs Assistance Bed Mobility: Supine to Sit;Sit to Supine     Supine to sit: Supervision Sit to supine: Supervision      Transfers Overall transfer level: Needs assistance Equipment used: Rolling walker (2 wheeled) Transfers: Sit to/from [employer] Sit to Stand: Min assist Stand pivot transfers: Min assist       General transfer comment: min A to power up into standing and to steady and verbal cues for hand placement     Balance Overall balance assessment: Needs assistance Sitting-balance support: Feet supported;No upper extremity supported Sitting balance-Leahy Scale: Fair      Standing balance support: Bilateral upper extremity supported;During functional activity Standing balance-Leahy Scale: Poor Standing balance comment: requires use of RW                           ADL either performed or assessed with clinical judgement   ADL Overall ADL's : Needs assistance/impaired Eating/Feeding: Set up;Bed level;Sitting   Grooming: Wash/dry hands;Wash/dry face;Oral care;Brushing hair;Minimal assistance;Standing   Upper Body Bathing: Set up;Supervision/ safety;Sitting   Lower Body Bathing: Minimal assistance;Sit to/from stand   Upper Body Dressing : Set up;Sitting   Lower Body Dressing: Minimal assistance;Sit to/from stand   Toilet Transfer: Min guard;RW   Toileting- Architect and Hygiene: Min guard;Sit to/from stand       Functional mobility during ADLs: Min guard;Rolling walker General ADL Comments: assist due to balance      Vision Baseline Vision/History: Wears glasses Wears Glasses: At all times Patient Visual Report: No change from baseline Vision Assessment?: Yes Eye Alignment: Impaired (comment) Ocular Range of Motion: Restricted on the right Tracking/Visual Pursuits: Decreased smoothness of vertical tracking Convergence: Within functional limits Visual Fields: No apparent deficits Additional Comments: Pt denies diplopia, but gaze appears dysconjugate.  He initially states he has no visual deficits and has not experienced a change with his vision, howver, later in session, reports his visual acuity is very poor and he can't see the details on the TV, or see details on cars      Perception Perception Perception Tested?: Yes   Praxis Praxis Praxis tested?:  Within functional limits    Pertinent Vitals/Pain Pain Assessment: No/denies pain     Hand Dominance Right   Extremity/Trunk Assessment Upper Extremity Assessment Upper Extremity Assessment: RUE deficits/detail RUE Deficits / Details: Pt with long standing  residual weakness Rt UE with shoulder elevation to ~80*  RUE Coordination: decreased gross motor   Lower Extremity Assessment Lower Extremity Assessment: Defer to PT evaluation   Cervical / Trunk Assessment Cervical / Trunk Assessment: Kyphotic   Communication Communication Communication: Expressive difficulties (dysarthric )   Cognition Arousal/Alertness: Awake/alert Behavior During Therapy: Flat affect Overall Cognitive Status: Impaired/Different from baseline Area of Impairment: Problem solving;Attention;Memory                   Current Attention Level: Sustained;Selective Memory: Decreased short-term memory       Problem Solving: Slow processing General Comments: Pt with flat affect.  Pt provided inconsistent responses in regards to PLOF    General Comments  HR to 114     Exercises     Shoulder Instructions      Home Living Family/patient expects to be discharged to:: Private residence Living Arrangements: Spouse/significant other Available Help at Discharge: Family;Available 24 hours/day (wife is retried) Type of Home: House Home Access: Stairs to enter [employer] of Steps: 4 Entrance Stairs-Rails: Left Home Layout: One level     Bathroom Shower/Tub: Producer, television/film/video: Handicapped height Bathroom Accessibility: Yes   Home Equipment: Environmental consultant - 2 wheels;Shower seat;Grab bars - tub/shower      Lives With: Spouse    Prior Functioning/Environment Level of Independence: Needs assistance  Gait / Transfers Assistance Needed: Pt reports to OT that he doesn't use any AD; however, during PT session with wife present, she reports pt utilizes RW  ADL's / Homemaking Assistance Needed: Pt reports to OT that he does ADLs mod I, however, per wife, during PT session, pt requires supervision    Comments: wife not present during OT eval         OT Problem List: Decreased strength;Decreased range of motion;Decreased activity  tolerance;Impaired balance (sitting and/or standing);Impaired vision/perception;Decreased coordination;Decreased cognition;Decreased safety awareness;Decreased knowledge of use of DME or AE;Obesity;Impaired UE functional use      OT Treatment/Interventions: Self-care/ADL training;DME and/or AE instruction;Therapeutic activities;Cognitive remediation/compensation;Visual/perceptual remediation/compensation;Patient/family education;Balance training    OT Goals(Current goals can be found in the care plan section) Acute Rehab OT Goals Patient Stated Goal: pt didn't state  OT Goal Formulation: With patient Time For Goal Achievement: 09/10/19 Potential to Achieve Goals: Good ADL Goals Pt Will Perform Grooming: with supervision Pt Will Perform Upper Body Bathing: with supervision;sitting Pt Will Perform Lower Body Bathing: with supervision;sit to/from stand Pt Will Perform Upper Body Dressing: with supervision;sitting Pt Will Perform Lower Body Dressing: with supervision;sit to/from stand Pt Will Transfer to Toilet: with supervision;ambulating;regular height toilet;grab bars Pt Will Perform Toileting - Clothing Manipulation and hygiene: with supervision;sit to/from stand  OT Frequency: Min 2X/week   Barriers to D/C:            Co-evaluation              AM-PAC OT "6 Clicks" Daily Activity     Outcome Measure Help from another person eating meals?: A Little Help from another person taking care of personal grooming?: A Little Help from another person toileting, which includes using toliet, bedpan, or urinal?: A Little Help from another person bathing (including washing, rinsing, drying)?: A Little Help from another person to put on and  taking off regular upper body clothing?: A Little Help from another person to put on and taking off regular lower body clothing?: A Little 6 Click Score: 18   End of Session Equipment Utilized During Treatment: Rolling walker Nurse Communication:  Mobility status  Activity Tolerance: Patient tolerated treatment well Patient left: in bed;with call bell/phone within reach;with bed alarm set  OT Visit Diagnosis: Unsteadiness on feet (R26.81);Cognitive communication deficit (R41.841) Symptoms and signs involving cognitive functions: Cerebral infarction                Time: 9678-9381 OT Time Calculation (min): 20 min Charges:  OT General Charges $OT Visit: 1 Visit OT Evaluation $OT Eval Moderate Complexity: 1 Mod  Eber Jones., OTR/L Acute Rehabilitation Services Pager [phone] Office [phone]   Jeani Hawking M 08/27/2019, 4:33 PM

## 2019-08-27 NOTE — Evaluation (Signed)
Physical Therapy Evaluation Patient Details Name: [name] MRN: [id number] DOB: 02-Jun-1950 Today's Date: 08/27/2019   History of Present Illness  [name] presenting with acute infarcts of bil occiptal lobes and anterior L pons. Pt was admitted back in april with CVA of PCA territory as well and then again on 6/27. Pt began to experience dysequilibrium, slurred speeach and difficulty walking while in [facility], went to ED but too long of a wait so came to [facility] when they came back.    Clinical Impression  Pt admitted with above. Pt with depressed spirits due to recurring episodes and re-hospitalization. Pt with noted slurred speech and difficulty with word finding. Pt unsteady requiring use of RW and remains at increased falls risk. Pt with good home set up and support. Recommend HHPT to progress indep with mobility. Acute PT to cont to follow.    Follow Up Recommendations Home health PT;Supervision/Assistance - 24 hour    Equipment Recommendations  None recommended by PT    Recommendations for Other Services       Precautions / Restrictions Precautions Precautions: Fall Precaution Comments: 2 hospital stays since april Restrictions Weight Bearing Restrictions: No      Mobility  Bed Mobility               General bed mobility comments: pt sitting at EOB  Transfers Overall transfer level: Needs assistance Equipment used: Rolling walker (2 wheeled) Transfers: Sit to/from Stand Sit to Stand: Min assist         General transfer comment: minA to power up and steady during transition of hands, required 2 attempts to complete  Ambulation/Gait Ambulation/Gait assistance: Min assist Gait Distance (Feet): 120 Feet Assistive device: Rolling walker (2 wheeled) Gait Pattern/deviations: Step-through pattern;Decreased stride length;Wide base of support Gait velocity: dec Gait velocity interpretation: <1.8 ft/sec, indicate of risk for recurrent falls General  Gait Details: max verbal cues to hold head up, instructed for patient to stay in walker and not step past the front of the walker  Stairs            Wheelchair Mobility    Modified Rankin (Stroke Patients Only) Modified Rankin (Stroke Patients Only) Pre-Morbid Rankin Score: Slight disability Modified Rankin: Moderate disability     Balance Overall balance assessment: Needs assistance Sitting-balance support: Feet supported;No upper extremity supported Sitting balance-Leahy Scale: Fair     Standing balance support: Bilateral upper extremity supported;During functional activity Standing balance-Leahy Scale: Poor Standing balance comment: requires use of RW                             Pertinent Vitals/Pain Pain Assessment: No/denies pain    Home Living Family/patient expects to be discharged to:: Private residence Living Arrangements: Spouse/significant other Available Help at Discharge: Family;Available 24 hours/day (wife is retried) Type of Home: House Home Access: Stairs to enter Entrance Stairs-Rails: Left Entrance Stairs-Number of Steps: 4 Home Layout: One level Home Equipment: Walker - 2 wheels;Shower seat;Grab bars - tub/shower      Prior Function Level of Independence: Needs assistance   Gait / Transfers Assistance Needed: pt has been using a walker since 05/31/19  ADL's / Homemaking Assistance Needed: wife has been supervising dressing, bathing        Hand Dominance        Extremity/Trunk Assessment   Upper Extremity Assessment Upper Extremity Assessment: RUE deficits/detail RUE Deficits / Details: limited shoulder ROM however  his baseline, grossly 3+/5    Lower Extremity Assessment Lower Extremity Assessment: Generalized weakness (mild co-ordination impairment)    Cervical / Trunk Assessment Cervical / Trunk Assessment: Kyphotic  Communication   Communication: Expressive difficulties  Cognition Arousal/Alertness:  Awake/alert Behavior During Therapy: Flat affect Overall Cognitive Status: Impaired/Different from baseline Area of Impairment: Problem solving                             Problem Solving: Slow processing General Comments: pt with flat affect/depressed spirits regarding being re-admitted 2 time since april for the same problem      General Comments General comments (skin integrity, edema, etc.): VSS, HR increased from 104-114bpm during amb    Exercises     Assessment/Plan    PT Assessment Patient needs continued PT services  PT Problem List Decreased strength;Decreased range of motion;Decreased activity tolerance;Decreased balance;Decreased mobility;Decreased coordination;Decreased cognition;Decreased knowledge of use of DME       PT Treatment Interventions DME instruction;Gait training;Stair training;Functional mobility training;Therapeutic activities;Therapeutic exercise;Balance training;Neuromuscular re-education    PT Goals (Current goals can be found in the Care Plan section)  Acute Rehab PT Goals Patient Stated Goal: stop coming into hospital PT Goal Formulation: With patient Time For Goal Achievement: 09/10/19 Potential to Achieve Goals: Good    Frequency Min 4X/week   Barriers to discharge        Co-evaluation               AM-PAC PT "6 Clicks" Mobility  Outcome Measure Help needed turning from your back to your side while in a flat bed without using bedrails?: None Help needed moving from lying on your back to sitting on the side of a flat bed without using bedrails?: A Little Help needed moving to and from a bed to a chair (including a wheelchair)?: A Little Help needed standing up from a chair using your arms (e.g., wheelchair or bedside chair)?: A Little Help needed to walk in hospital room?: A Little Help needed climbing 3-5 steps with a railing? : A Lot 6 Click Score: 18    End of Session Equipment Utilized During Treatment: Gait  belt Activity Tolerance: Patient tolerated treatment well Patient left: in chair;with call bell/phone within reach;with chair alarm set Nurse Communication: Mobility status PT Visit Diagnosis: Muscle weakness (generalized) (M62.81);Difficulty in walking, not elsewhere classified (R26.2)    Time: 0272-5366 PT Time Calculation (min) (ACUTE ONLY): 20 min   Charges:   PT Evaluation $PT Eval Moderate Complexity: 1 Mod          Lewis Shock, PT, DPT Acute Rehabilitation Services Pager #: [phone] Office #: [phone]   Iona Hansen 08/27/2019, 9:59 AM

## 2019-08-28 DIAGNOSIS — Z7189 Other specified counseling: Secondary | ICD-10-CM | POA: Diagnosis not present

## 2019-08-28 DIAGNOSIS — R471 Dysarthria and anarthria: Secondary | ICD-10-CM | POA: Diagnosis not present

## 2019-08-28 DIAGNOSIS — N1831 Chronic kidney disease, stage 3a: Secondary | ICD-10-CM | POA: Diagnosis not present

## 2019-08-28 DIAGNOSIS — I63213 Cerebral infarction due to unspecified occlusion or stenosis of bilateral vertebral arteries: Secondary | ICD-10-CM | POA: Diagnosis not present

## 2019-08-28 DIAGNOSIS — Z515 Encounter for palliative care: Secondary | ICD-10-CM | POA: Diagnosis not present

## 2019-08-28 DIAGNOSIS — E785 Hyperlipidemia, unspecified: Secondary | ICD-10-CM | POA: Diagnosis not present

## 2019-08-28 DIAGNOSIS — E118 Type 2 diabetes mellitus with unspecified complications: Secondary | ICD-10-CM | POA: Diagnosis not present

## 2019-08-28 DIAGNOSIS — I679 Cerebrovascular disease, unspecified: Secondary | ICD-10-CM | POA: Diagnosis not present

## 2019-08-28 LAB — CBC WITH DIFFERENTIAL/PLATELET
Abs Immature Granulocytes: 0.02 10*3/uL (ref 0.00–0.07)
Basophils Absolute: 0 10*3/uL (ref 0.0–0.1)
Basophils Relative: 1 %
Eosinophils Absolute: 0.4 10*3/uL (ref 0.0–0.5)
Eosinophils Relative: 6 %
HCT: 37.2 % — ABNORMAL LOW (ref 39.0–52.0)
Hemoglobin: 12.8 g/dL — ABNORMAL LOW (ref 13.0–17.0)
Immature Granulocytes: 0 %
Lymphocytes Relative: 22 %
Lymphs Abs: 1.3 10*3/uL (ref 0.7–4.0)
MCH: 31.8 pg (ref 26.0–34.0)
MCHC: 34.4 g/dL (ref 30.0–36.0)
MCV: 92.3 fL (ref 80.0–100.0)
Monocytes Absolute: 0.8 10*3/uL (ref 0.1–1.0)
Monocytes Relative: 14 %
Neutro Abs: 3.3 10*3/uL (ref 1.7–7.7)
Neutrophils Relative %: 57 %
Platelets: 220 10*3/uL (ref 150–400)
RBC: 4.03 MIL/uL — ABNORMAL LOW (ref 4.22–5.81)
RDW: 13.2 % (ref 11.5–15.5)
WBC: 5.8 10*3/uL (ref 4.0–10.5)
nRBC: 0 % (ref 0.0–0.2)

## 2019-08-28 LAB — COMPREHENSIVE METABOLIC PANEL
ALT: 19 U/L (ref 0–44)
AST: 21 U/L (ref 15–41)
Albumin: 3 g/dL — ABNORMAL LOW (ref 3.5–5.0)
Alkaline Phosphatase: 112 U/L (ref 38–126)
Anion gap: 9 (ref 5–15)
BUN: 14 mg/dL (ref 8–23)
CO2: 25 mmol/L (ref 22–32)
Calcium: 8.9 mg/dL (ref 8.9–10.3)
Chloride: 105 mmol/L (ref 98–111)
Creatinine, Ser: 1.44 mg/dL — ABNORMAL HIGH (ref 0.61–1.24)
GFR calc Af Amer: 57 mL/min — ABNORMAL LOW (ref >60)
GFR calc non Af Amer: 50 mL/min — ABNORMAL LOW (ref >60)
Glucose, Bld: 114 mg/dL — ABNORMAL HIGH (ref 70–99)
Potassium: 3.5 mmol/L (ref 3.5–5.1)
Sodium: 139 mmol/L (ref 135–145)
Total Bilirubin: 0.8 mg/dL (ref 0.3–1.2)
Total Protein: 6.3 g/dL — ABNORMAL LOW (ref 6.5–8.1)

## 2019-08-28 LAB — MAGNESIUM: Magnesium: 2 mg/dL (ref 1.7–2.4)

## 2019-08-28 LAB — PHOSPHORUS: Phosphorus: 3.5 mg/dL (ref 2.5–4.6)

## 2019-08-28 LAB — GLUCOSE, CAPILLARY
Glucose-Capillary: 102 mg/dL — ABNORMAL HIGH (ref 70–99)
Glucose-Capillary: 106 mg/dL — ABNORMAL HIGH (ref 70–99)
Glucose-Capillary: 109 mg/dL — ABNORMAL HIGH (ref 70–99)
Glucose-Capillary: 119 mg/dL — ABNORMAL HIGH (ref 70–99)
Glucose-Capillary: 121 mg/dL — ABNORMAL HIGH (ref 70–99)
Glucose-Capillary: 154 mg/dL — ABNORMAL HIGH (ref 70–99)

## 2019-08-28 MED ORDER — SODIUM CHLORIDE 0.9 % IV SOLN: INTRAVENOUS | Status: DC

## 2019-08-28 NOTE — Progress Notes (Signed)
°  Speech Language Pathology Treatment: Dysphagia  Patient Details Name: [name] MRN: [id number] DOB: 18-Jul-1950 Today's Date: 08/28/2019 Time: 6073-7106 SLP Time Calculation (min) (ACUTE ONLY): 10 min  Assessment / Plan / Recommendation Clinical Impression  Pt was seen for diagnostic treatment and diet tolerance.  He was encountered asleep in bed and he roused to verbal and tactile stimulation; however, he remained lethargic throughout this tx session and PO trials were limited.  Pt denied coughing/choking with PO intake on this date.  He was agreeable to minimal trials of thin liquid via straw sip.  AP transport and swallow initiation appeared timely with all trials.  An additional swallow was noted following 2/5 trials, possibly indicative of pharyngeal residue or esophageal dysfunction, but no overt s/sx of aspiration were observed with any thin liquid trials of this date.  Therefore, recommend continuation of regular solids and thin liquids with intermittent supervision to cue for compensatory strategies (listed below).  SLP will briefly f/u to monitor diet tolerance.     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 Pt was admitted back in April with CVA of PCA territory as well and then again on 6/27. Pt began to experience dysequilibrium, slurred speech and difficulty walking while in [facility], went to ED but too long of a wait so came to [facility] when they came back.       SLP Plan  Continue with current plan of care       Recommendations  Diet recommendations: Thin liquid;Regular Liquids provided via: Cup;Straw Medication Administration: Whole meds with puree Supervision: Patient able to self feed;Intermittent supervision to cue for compensatory strategies Compensations: Minimize environmental distractions;Slow rate;Small sips/bites Postural Changes and/or Swallow Maneuvers: Seated upright 90 degrees                 Oral Care Recommendations: Oral care BID Follow up Recommendations: Home health SLP;Outpatient SLP SLP Visit Diagnosis: Dysphagia, unspecified (R13.10) Plan: Continue with current plan of care       GO               Villa Herb M.S., CCC-SLP Acute Rehabilitation Services Office: [phone]  Shanon Rosser Ryna Beckstrom 08/28/2019, 3:20 PM

## 2019-08-28 NOTE — Progress Notes (Signed)
STROKE TEAM PROGRESS NOTE   INTERVAL HISTORY Pt lying in bed, no family at bedside. No acute event overnight. Pending cerebral angiogram in am. Dr. Corliss Skains aware.  OBJECTIVE Vitals:   08/27/19 2017 08/28/19 0002 08/28/19 0414 08/28/19 0715  BP: 100/68 (!) 130/82 (!) 104/45 121/71  Pulse: 71 67 56 71  Resp: 20 18 16    Temp: 97.9 F (36.6 C) 98.1 F (36.7 C) 97.9 F (36.6 C) 98.5 F (36.9 C)  TempSrc:    Oral  SpO2: 96% 97% 98% 99%  Weight:      Height:        CBC:  Recent Labs  Lab 08/27/19 1815 08/28/19 0407  WBC 6.2 5.8  NEUTROABS 3.8 3.3  HGB 13.1 12.8*  HCT 37.9* 37.2*  MCV 92.4 92.3  PLT 245 220    Basic Metabolic Panel:  Recent Labs  Lab 08/27/19 1815 08/28/19 0407  NA 138 139  K 3.7 3.5  CL 102 105  CO2 25 25  GLUCOSE 129* 114*  BUN 15 14  CREATININE 1.35* 1.44*  CALCIUM 8.9 8.9  MG 1.8 2.0  PHOS 3.6 3.5    Lipid Panel:     Component Value Date/Time   CHOL 96 08/27/2019 1815   TRIG 96 08/27/2019 1815   HDL 22 (L) 08/27/2019 1815   CHOLHDL 4.4 08/27/2019 1815   VLDL 19 08/27/2019 1815   LDLCALC 55 08/27/2019 1815   HgbA1c:  Lab Results  Component Value Date   HGBA1C 6.2 (H) 08/27/2019   Urine Drug Screen: No results found for: LABOPIA, COCAINSCRNUR, LABBENZ, AMPHETMU, THCU, LABBARB  Alcohol Level No results found for: ETH  IMAGING  CT HEAD WO CONTRAST 08/26/2019 IMPRESSION:  1. No acute intracranial abnormality. Signs of prior infarct, atrophy and chronic microvascular ischemic change.  2. Infarcts exhibited recent MRI not well seen on today's study with evidence of remote infarct in the bilateral occipital poles but without signs of acute intracranial process.  3. Bilateral maxillary sinus disease, new since previous imaging worse on the RIGHT.    MR BRAIN WO CONTRAST 08/26/2019 IMPRESSION:  Acute infarcts involving the bilateral occipital lobes and anterior left pons. Remote infarcts involving the bilateral occipital lobes,  bilateral cerebellum, dorsal left midbrain, bilateral centrum semiovale, bilateral thalami and left frontal white matter. 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   Interventional Radiology - Cerebral Angiogram - pending  ECG - SR rate 83 BPM. (See cardiology reading for complete details)  ECHOCARDIOGRAM COMPLETE IMPRESSIONS  1. Left ventricular ejection fraction, by estimation, is 45 to 50%. The left ventricle has mildly decreased function. The left ventricle demonstrates global hypokinesis. There is mild concentric left ventricular hypertrophy. Left ventricular diastolic parameters are indeterminate.  2. Right ventricular systolic function is normal. The right ventricular size is normal.  3. The mitral valve is normal in structure. Trivial mitral valve regurgitation. No evidence of mitral stenosis.  4. The aortic valve is tricuspid. Aortic valve regurgitation is mild.  5. The inferior vena cava is normal in size with greater than 50% respiratory variability, suggesting right atrial pressure of 3 mmHg. Conclusion(s)/Recommendation(s): No intracardiac source of embolism detected on this transthoracic study.   PHYSICAL EXAM  Temp:  [97.9 F (36.6 C)-98.5 F (36.9 C)] 98.5 F (36.9 C) (07/25 0715) Pulse Rate:  [56-71] 71 (07/25 0715) Resp:  [16-20] 16 (07/25 0414) BP: (100-130)/(45-84) 121/71 (07/25 0715) SpO2:  [96 %-99 %] 99 % (07/25 0715)  General - Well nourished,  well developed, in no apparent distress.  Ophthalmologic - fundi not visualized due to noncooperation.  Cardiovascular - Regular rhythm and rate.  Neuro - awake, alert, orientated to time place and people.  No aphasia, however moderate dysarthria.  Follows simple commands, able to name and repeat.  Visual field grossly full with finger moving test but not counting fingers, decreased visual acuity bilaterally.  Extraocular movement grossly intact, denies diplopia.  Right nasolabial fold flattening,  tongue midline.  Right upper extremity 4/5 with pronator drift.  Right upper extremity and bilateral lower extremity 5/5.  Bilateral finger-to-nose intact, however bilateral heel-to-shin mild dysmetria.  Sensation symmetrical.  Gait not tested.   ASSESSMENT/PLAN [name] is a 69 y.o. male with history of HLD (hyperlipidemia), MI (myocardial infarction) (HCC), and prior Strokes (HCC) who presents with dysarthria.  He did not receive IV t-PA.  Strokes: Acute infarcts involving the bilateral occipital lobes and anterior left pons - large vessel disease due to VA stenosis/occlusion  CT head -  No acute intracranial abnormality  MRI head - Acute infarcts involving the bilateral occipital lobes and anterior left pons. Remote infarcts involving the bilateral occipital lobes, bilateral cerebellum, dorsal left midbrain, bilateral centrum semiovale, bilateral thalami and left frontal white matter. 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   CTA head and neck - right V4 occluded. Left VBJ high grade stenosis, BA patent  Cerebral angio pending  2D Echo - EF 45-50%  Sars Corona Virus 2 - negative  LDL - 55  HgbA1c 6.2  VTE prophylaxis - [place] Heparin  aspirin 81 mg daily and clopidogrel 75 mg daily prior to admission, now on clopidogrel 75 mg daily and ASA 325. Continue DAPT for 3 months and then plavix alone.  Patient counseled to be compliant with his antithrombotic medications  Ongoing aggressive stroke risk factor management  Therapy recommendations:  HH PT  Disposition:  Pending  Recurrent posterior infarcts  05/2019 MRI bilateral occipital PCA infarcts, CTA head and neck positive for Severe stenosis of the bilateral VBJ, and moderate to severe stenoses of both proximal Vertebral Arteries.  60% stenosis Left ICA bulb due to soft plaque. Moderate bilateral. ICA siphon stenosis due to bulky plaque. Mild left MCA M1 stenosis. Mild to moderate ACA A2  stenoses.  07/2019 Multiple acute infarcts of the cerebellumand occipital lobes  bilaterally as well as the left thalamus and posterior left mid brain. CTA H&N- June 2021- severe bilateral vertebrobasilar junction stenosis and advanced atherosclerotic changes elsewhere in the head and neck including moderate to severe bilateral vertebral artery origin stenosis, 60% stenosis left ICA bulb.  Pending cerebral angio for better assessment  May consider VBJ stenting if needed.  Hypertension  Home BP meds: Norvasc ; Coreg  Current BP meds: None  Stable on the low end . Permissive hypertension (OK if < 220/120) but gradually normalize in 5-7 days  . Put on IVF @ 75cc . Long-term BP goal 130-160 give posterior stenosis  Hyperlipidemia  Home Lipid lowering medication: Lipitor 80 mg daily  LDL - 55 goal < 70  Current lipid lowering medication: Lipitor 80 mg daily   Continue statin at discharge  Other Stroke Risk Factors  Advanced age  Previous ETOH use.  Obesity, Body mass index is 34.44 kg/m., recommend weight loss, diet and exercise as appropriate   Coronary artery disease  Other Active Problems  Code status - Full code  CKD - stage 3a - creatinine - 1.56->1.60->1.47->1.35->1.44  Palliative  Care Consult - 08/27/19   Hospital day # 1  Marvel Plan, MD PhD Stroke Neurology 08/28/2019 7:18 PM   To contact Stroke Continuity provider, please refer to [email or web address]. After hours, contact General Neurology

## 2019-08-28 NOTE — Progress Notes (Signed)
PROGRESS NOTE    [name]  ZOX:[id number] DOB: 11-10-1950 DOA: 08/26/2019 PCP: Katherine Basset, PA-C     Brief Narrative:  69 yr old WM PMHx CAD, history of stents, HLD, Posterior circulation CVA . In April of 2021 the patient was diagnosed with a CVA of the PCA territory with chronic microhemorhages, as well as thalamic, brainstem, and cerebellar stroke. 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 He had 60% of the left ICA.  He was discharged on aspirin and plavix. He was readmitted to [facility] on 07/31/2019 for another posterior circulation stroke. His wife had stopped the ASA 2 weeks prior to this presentation after being told by a home health nurse that he should not be taking both ASA and Plavix. This time he was discharged to rehab on both aspiring and plavix. He was discharged to home and was doing well. He went to [facility] with his family. He again developed dysequilibrium, slurred speech, and difficulty walking. He was seen in an ED in [facility], but refused to stay because of the wait. He came here when he got home.  In the ED the patient was found to have normal vital signs. Creatinine was 1.60, and a normal CBC.  CT head demonstrated signs of prior infarct, atrophy, and chronic microvascular ischemic changes, and bilateral maxillary sinus disease.  MRI Brain demonstrated acute infarcts of the bilateral occipital lobes and anterior left pons. There was also evidence of remote infarcts involving the bilateral occipital lobes, bilateral cerebellum, dorsal left midbrain, bilateral centrum semiovale, bilateral thalami, and left frontal white matter. 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  Neurology was called from the ED and recommended admission and repeat vessel imaging to rule out an acute thrombus and to rule out  other causes . The patient also should be admitted to monitor for worsening of symptoms.  The patient denies fevers, chills, nausea, vomiting, cough, chest pain, shortness of breath. He denies hematemesis, melena, hematochezia, or coffee ground emesis.     Subjective: 7/25 A/O x3 (does not know when), negative CP, negative abdominal pain.  Still mild slurred speech but improved from 7/24.   Assessment & Plan: Covid vaccination; negative vaccination   Active Problems:   Stroke (HCC)   Slurred speech   Dyslipidemia   CKD (chronic kidney disease), stage III   DM II (diabetes mellitus, type II), controlled (HCC)   Cerebrovascular disease   Palliative care by specialist   Goals of care, counseling/discussion   Stroke (cerebrum) (HCC)  Acute on Chronic CVA -Acute infarct involving bilateral occipital lobes and anterior LEFT pons.  Embolic?  Source unknown. -CTA head/neck pending -Echocardiogram pending -7/25 discussed case with Dr.Xu stroke team plan is for in the A.m. patient to go to lab and determine if there is vasculature in the posterior circulation which can be stented open.   Essential HTN -Allow permissive HTN -Coreg 3.125 mg BID (hold)  HLD -Atorvastatin 80 mg daily -Lipid panel pending  DM type II uncontrolled with complication -Hemoglobin A1c pending -Very sensitive SSI  CKD stage IIIa (baseline Cr 1.56 no previous creatinine for comparison) -Hold all nephrotoxic medication -Monitor closely Recent Labs  Lab 08/26/19 0658 08/26/19 0726 08/26/19 1911 08/27/19 1815 08/28/19 0407  CREATININE 1.56* 1.60* 1.47* 1.35* 1.44*       Goals of care -7/24; Palliative Care Consult; patient  with multiple recent strokes evaluate for change of CODE STATUS to DNR. Address future medical directives   DVT prophylaxis: Subcu heparin Code Status: Full Family Communication:  Status is: Inpatient    Dispo: The patient is from: Home              Anticipated d/c  is to: CIR vs SNF              Anticipated d/c date is: 7/30              Patient currently unstable      Consultants:  Stroke team  Procedures/Significant Events:  CTA head/neck pending    I have personally reviewed and interpreted all radiology studies and my findings are as above.  VENTILATOR SETTINGS:    Cultures   Antimicrobials:    Devices    LINES / TUBES:      Continuous Infusions: . sodium chloride Stopped (08/27/19 0042)  . [START ON 08/29/2019] sodium chloride       Objective: Vitals:   08/27/19 2017 08/28/19 0002 08/28/19 0414 08/28/19 0715  BP: 100/68 (!) 130/82 (!) 104/45 121/71  Pulse: 71 67 56 71  Resp: 20 18 16    Temp: 97.9 F (36.6 C) 98.1 F (36.7 C) 97.9 F (36.6 C) 98.5 F (36.9 C)  TempSrc:    Oral  SpO2: 96% 97% 98% 99%  Weight:      Height:        Intake/Output Summary (Last 24 hours) at 08/28/2019 1110 Last data filed at 08/28/2019 0900 Gross per 24 hour  Intake 360 ml  Output 425 ml  Net -65 ml   Filed Weights   08/27/19 0313  Weight: (!) 108.9 kg    Examination:  General: A/O x3 (does not know when) No acute respiratory distress Eyes: negative scleral hemorrhage, negative anisocoria, negative icterus ENT: Negative Runny nose, negative gingival bleeding, LEFT temporal hemianopsia Neck:  Negative scars, masses, torticollis, lymphadenopathy, JVD Lungs: Clear to auscultation bilaterally without wheezes or crackles Cardiovascular: Regular rate and rhythm without murmur gallop or rub normal S1 and S2 Abdomen: negative abdominal pain, nondistended, positive soft, bowel sounds, no rebound, no ascites, no appreciable mass Extremities: No significant cyanosis, clubbing, or edema bilateral lower extremities Skin: Negative rashes, lesions, ulcers Psychiatric:  Negative depression, negative anxiety, negative fatigue, negative mania  Central nervous system:  Cranial nerves II through XII intact, tongue/uvula midline, LEFT  extremities muscle strength 5/5, RIGHT extremity muscle strength 4/5, sensation intact throughout, finger nose finger LEFT within normal limits, unable to perform on the RIGHT.  Quick finger touch bilateral within normal limits, positive mild dysarthria, negative expressive aphasia, negative receptive aphasia.  .     Data Reviewed: Care during the described time interval was provided by me .  I have reviewed this patient's available data, including medical history, events of note, physical examination, and all test results as part of my evaluation.  CBC: Recent Labs  Lab 08/26/19 0658 08/26/19 0726 08/26/19 1911 08/27/19 1815 08/28/19 0407  WBC 7.9  --  6.9 6.2 5.8  NEUTROABS 5.4  --   --  3.8 3.3  HGB 14.0 13.6 14.4 13.1 12.8*  HCT 40.9 40.0 42.2 37.9* 37.2*  MCV 95.1  --  94.8 92.4 92.3  PLT 281  --  278 245 220   Basic Metabolic Panel: Recent Labs  Lab 08/26/19 0658 08/26/19 0726 08/26/19 1911 08/27/19 1815 08/28/19 0407  NA 138 142  --  138 139  K  3.7 3.6  --  3.7 3.5  CL 103 102  --  102 105  CO2 25  --   --  25 25  GLUCOSE 163* 160*  --  129* 114*  BUN 16 18  --  15 14  CREATININE 1.56* 1.60* 1.47* 1.35* 1.44*  CALCIUM 9.4  --   --  8.9 8.9  MG  --   --   --  1.8 2.0  PHOS  --   --   --  3.6 3.5   GFR: Estimated Creatinine Clearance: 60.7 mL/min (A) (by C-G formula based on SCr of 1.44 mg/dL (H)). Liver Function Tests: Recent Labs  Lab 08/26/19 0658 08/27/19 1815 08/28/19 0407  AST 23 24 21   ALT 22 23 19   ALKPHOS 106 122 112  BILITOT 0.8 0.8 0.8  PROT 7.4 6.8 6.3*  ALBUMIN 3.4* 3.2* 3.0*   No results for input(s): LIPASE, AMYLASE in the last 168 hours. No results for input(s): AMMONIA in the last 168 hours. Coagulation Profile: Recent Labs  Lab 08/26/19 0658  INR 1.0   Cardiac Enzymes: No results for input(s): CKTOTAL, CKMB, CKMBINDEX, TROPONINI in the last 168 hours. BNP (last 3 results) No results for input(s): PROBNP in the last 8760  hours. HbA1C: Recent Labs    08/27/19 0652 08/27/19 1815  HGBA1C 6.1* 6.2*   CBG: Recent Labs  Lab 08/27/19 1745 08/27/19 2019 08/28/19 0004 08/28/19 0415 08/28/19 0758  GLUCAP 95 117* 102* 119* 121*   Lipid Profile: Recent Labs    08/27/19 0652 08/27/19 1815  CHOL 100 96  HDL 21* 22*  LDLCALC 54 55  TRIG 124 96  CHOLHDL 4.8 4.4   Thyroid Function Tests: No results for input(s): TSH, T4TOTAL, FREET4, T3FREE, THYROIDAB in the last 72 hours. Anemia Panel: No results for input(s): VITAMINB12, FOLATE, FERRITIN, TIBC, IRON, RETICCTPCT in the last 72 hours. Sepsis Labs: No results for input(s): PROCALCITON, LATICACIDVEN in the last 168 hours.  Recent Results (from the past 240 hour(s))  SARS Coronavirus 2 by RT PCR (hospital order, performed in [facility] hospital lab) Nasopharyngeal Nasopharyngeal Swab     Status: None   Collection Time: 08/26/19  3:00 PM   Specimen: Nasopharyngeal Swab  Result Value Ref Range Status   SARS Coronavirus 2 NEGATIVE NEGATIVE Final    Comment: (NOTE) SARS-CoV-2 target nucleic acids are NOT DETECTED.  The SARS-CoV-2 RNA is generally detectable in upper and lower respiratory specimens during the acute phase of infection. The lowest concentration of SARS-CoV-2 viral copies this assay can detect is 250 copies / mL. A negative result does not preclude SARS-CoV-2 infection and should not be used as the sole basis for treatment or other patient management decisions.  A negative result may occur with improper specimen collection / handling, submission of specimen other than nasopharyngeal swab, presence of viral mutation(s) within the areas targeted by this assay, and inadequate number of viral copies (<250 copies / mL). A negative result must be combined with clinical observations, patient history, and epidemiological information.  Fact Sheet for Patients:   [facility]  Fact Sheet for Healthcare  Providers: 08/28/19  This test is not yet approved or  cleared by the [employer] FDA and has been authorized for detection and/or diagnosis of SARS-CoV-2 by FDA under an Emergency Use Authorization (EUA).  This EUA will remain in effect (meaning this test can be used) for the duration of the COVID-19 declaration under Section 564(b)(1) of the Act, 21 U.S.C. section 360bbb-3(b)(1),  unless the authorization is terminated or revoked sooner.  Performed at [facility], [address] [place], [place] [address]          Radiology Studies: MR BRAIN WO CONTRAST  Result Date: 08/26/2019 CLINICAL DATA:  Neuro deficit. EXAM: MRI HEAD WITHOUT CONTRAST TECHNIQUE: Multiplanar, multiecho pulse sequences of the brain and surrounding structures were obtained without intravenous contrast. COMPARISON:  08/26/2019 head CT.  08/31/2019 MRI head. FINDINGS: Brain: Multifocal restricted diffusion and correlative T2 hyperintense signal involving the bilateral occipital lobes and anterior left pons. Tiny remote microhemorrhages involving the bilateral cerebellum. Moderate diffuse parenchymal volume loss. Sequela of remote bilateral occipital and cerebellar infarcts. Smaller remote infarcts are seen involving the dorsal left midbrain, bilateral centrum semiovale, bilateral thalami and left frontal white matter. No midline shift, mass lesion or extra-axial fluid collection. Vascular: FLAIR hyperintense signal within the right V4 segment may reflect slow flow versus occlusion, unchanged from prior exam. Skull and upper cervical spine: Normal marrow signal. Sinuses/Orbits: Normal orbits. Mild-to-moderate pansinus mucosal thickening with layering bilateral maxillary secretions. Trace left mastoid effusion. Other: None. IMPRESSION: Acute infarcts involving the bilateral occipital lobes and anterior left pons. Remote infarcts involving the bilateral occipital lobes, bilateral  cerebellum, dorsal left midbrain, bilateral centrum semiovale, bilateral thalami and left frontal white matter. 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 These results were called by telephone at the time of interpretation on 08/26/2019 at 5:08 pm to provider Dr. Fredderick PhenixBelfi, who verbally acknowledged these results. Electronically Signed   By: Stana Buntinghikanele  Emekauwa M.D.   On: 08/26/2019 17:11   ECHOCARDIOGRAM COMPLETE  Result Date: 08/27/2019    ECHOCARDIOGRAM REPORT   Patient Name:   Gillis EndsROBERT Dolder Date of Exam: 08/27/2019 Medical Rec #:  [id number]    Height:       70.0 in Accession #:    [id number]   Weight:       240.0 lb Date of Birth:  1951/02/01    BSA:          2.255 m Patient Age:    68 years     BP:           141/92 mmHg Patient Gender: M            HR:           71 bpm. Exam Location:  Inpatient Procedure: 2D Echo, Cardiac Doppler and Color Doppler Indications:    Stroke 434.91 / I163.9  History:        Patient has no prior history of Echocardiogram examinations.                 Previous Myocardial Infarction; Risk Factors:Dyslipidemia.  Sonographer:    Elmarie Shileyiffany Dance Referring Phys: [id number] AVA SWAYZE IMPRESSIONS  1. Left ventricular ejection fraction, by estimation, is 45 to 50%. The left ventricle has mildly decreased function. The left ventricle demonstrates global hypokinesis. There is mild concentric left ventricular hypertrophy. Left ventricular diastolic parameters are indeterminate.  2. Right ventricular systolic function is normal. The right ventricular size is normal.  3. The mitral valve is normal in structure. Trivial mitral valve regurgitation. No evidence of mitral stenosis.  4. The aortic valve is tricuspid. Aortic valve regurgitation is mild.  5. The inferior vena cava is normal in size with greater than 50% respiratory variability, suggesting right atrial pressure of 3 mmHg. Conclusion(s)/Recommendation(s): No intracardiac source of embolism detected on this  transthoracic study. A transesophageal echocardiogram is recommended to exclude cardiac  source of embolism if clinically indicated. FINDINGS  Left Ventricle: Left ventricular ejection fraction, by estimation, is 45 to 50%. The left ventricle has mildly decreased function. The left ventricle demonstrates global hypokinesis. The left ventricular internal cavity size was normal in size. There is  mild concentric left ventricular hypertrophy. Left ventricular diastolic parameters are indeterminate. Right Ventricle: The right ventricular size is normal. No increase in right ventricular wall thickness. Right ventricular systolic function is normal. Left Atrium: Left atrial size was normal in size. Right Atrium: Right atrial size was normal in size. Pericardium: Trivial pericardial effusion is present. Presence of pericardial fat pad. Mitral Valve: The mitral valve is normal in structure. Trivial mitral valve regurgitation. No evidence of mitral valve stenosis. Tricuspid Valve: The tricuspid valve is normal in structure. Tricuspid valve regurgitation is trivial. No evidence of tricuspid stenosis. Aortic Valve: The aortic valve is tricuspid. Aortic valve regurgitation is mild. Aortic regurgitation PHT measures 745 msec. Pulmonic Valve: The pulmonic valve was not well visualized. Pulmonic valve regurgitation is not visualized. Aorta: The aortic root and ascending aorta are structurally normal, with no evidence of dilitation. Venous: The inferior vena cava is normal in size with greater than 50% respiratory variability, suggesting right atrial pressure of 3 mmHg. IAS/Shunts: No atrial level shunt detected by color flow Doppler.  LEFT VENTRICLE PLAX 2D LVIDd:         4.40 cm  Diastology LVIDs:         3.40 cm  LV e' lateral:   4.03 cm/s LV PW:         1.20 cm  LV E/e' lateral: 10.4 LV IVS:        1.40 cm  LV e' medial:    4.79 cm/s LVOT diam:     2.10 cm  LV E/e' medial:  8.8 LV SV:         50 LV SV Index:   22 LVOT Area:      3.46 cm  RIGHT VENTRICLE             IVC RV Basal diam:  2.70 cm     IVC diam: 1.40 cm RV S prime:     10.00 cm/s TAPSE (M-mode): 2.0 cm LEFT ATRIUM             Index       RIGHT ATRIUM          Index LA diam:        4.70 cm 2.08 cm/m  RA Area:     8.83 cm LA Vol (A2C):   53.5 ml 23.72 ml/m RA Volume:   12.50 ml 5.54 ml/m LA Vol (A4C):   34.9 ml 15.47 ml/m LA Biplane Vol: 43.7 ml 19.37 ml/m  AORTIC VALVE LVOT Vmax:   79.15 cm/s LVOT Vmean:  51.500 cm/s LVOT VTI:    0.146 m AI PHT:      745 msec  AORTA Ao Root diam: 3.80 cm Ao Asc diam:  3.75 cm MITRAL VALVE MV Area (PHT): 2.73 cm    SHUNTS MV Decel Time: 278 msec    Systemic VTI:  0.15 m MV E velocity: 42.10 cm/s  Systemic Diam: 2.10 cm MV A velocity: 96.80 cm/s MV E/A ratio:  0.43 Jodelle Red MD Electronically signed by Jodelle Red MD Signature Date/Time: 08/27/2019/2:58:02 PM    Final         Scheduled Meds: . aspirin EC  325 mg Oral Daily  . atorvastatin  80 mg Oral q1800  .  clopidogrel  75 mg Oral Daily  . febuxostat  40 mg Oral Daily  . FLUoxetine  10 mg Oral Daily  . heparin  5,000 Units Subcutaneous Q8H  . insulin aspart  0-6 Units Subcutaneous Q4H  . multivitamin with minerals  1 tablet Oral Daily  . sodium chloride flush  3 mL Intravenous Once   Continuous Infusions: . sodium chloride Stopped (08/27/19 0042)  . [START ON 08/29/2019] sodium chloride       LOS: 1 day    Time spent:40 min    Katalia Choma, Roselind Messier, MD [facility] Pager [phone]  If 7PM-7AM, please contact night-coverage [email or web address] Password [email or web address] 08/28/2019, 11:10 AM

## 2019-08-28 NOTE — Consult Note (Signed)
Chief Complaint: Patient was seen in consultation today for diagnostic cerebral angiogram.  Referring Physician(s): Marvel Plan  Supervising Physician: [name]  Patient Status: [facility] - In-pt  History of Present Illness: [name] is a 69 y.o. 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 In April 2021 Mr. Siordia presented to [facility] was diagnosed with a CVA of the PCA territory with chronic microhemorrhages and thalamic, brainstem and cerebellar stroke. 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 He was discharged on Plavix + ASA. He was again admitted to [facility] on 07/31/19 for a second posterior circulation CVA, his wife had stopped ASA two weeks prior after being told by a HHN that he should not be on Plavix and ASA. He was discharged to rehab on ASA + Plavix and after completing treatment there was discharged home on 7/14. He and his family have a vacation home in [place] [facility] [facility] which they were visiting last week when he developed trouble with balance, walking and slurred speech. He presented to the ED there had a CT head which did not show acute findings however he declined to be admitted for further evaluation/MRI. His family brought him to the ED on 7/23 due to worsening symptoms.  Initial workup in the ED showed patient to be afebrile, slightly hypertensive @ 134/88 mmHg, creatinine 1.56, EKG showed NSR. CT head w/o contrast showed no acute intracranial abnormality with signs of prior infarct. 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 He was admitted for further evaluation and management and underwent a CTA head/neck which showed right V4 occluded, left VBJ high grade stenosis, BA patent. NIR has been asked to perform a diagnostic cerebral angiogram to further evaluate the above findings.  Mr. Endsley seen in his room today, RN at bedside show states patient is confused and visually impaired. No family present during visit. Mr. Kurek tells me he feels ok right now, no pain or other complaints. He reports being unable to see essentially anything out of either eye since his previous strokes and has not noticed any recent changes in his vision such as double vision or dark patches. He denies any difficulty eating or drinking at home. He does have trouble walking without help. He tells me that he's "going to leave soon anyway" and that his wife "probably won't know where I am because I ran away." He is able to tell me his name, birthday and that he is in the hospital - he is agreeable to stay in the hospital for further testing.   Past Medical History:  Diagnosis Date  . HLD (hyperlipidemia)   . MI (myocardial infarction) (HCC)   . Stroke [facility])     Past Surgical History:  Procedure Laterality Date  . CORONARY STENT INTERVENTION      Allergies: Patient has no known allergies.  Medications: Prior to Admission medications   Medication Sig Start Date End Date Taking? Authorizing Provider  amLODipine (  NORVASC) 2.5 MG tablet Take 2.5 mg by mouth daily. 08/17/19  Yes [provider]  aspirin EC 81 MG tablet Take 81 mg by mouth daily. Swallow whole.   Yes [provider]  atorvastatin (LIPITOR) 80 MG tablet Take 80 mg by mouth daily at 6 PM.  08/17/19  Yes [provider]  carvedilol (COREG) 3.125 MG  tablet Take 3.125 mg by mouth 2 (two) times daily. 07/07/19  Yes [provider]  clopidogrel (PLAVIX) 75 MG tablet Take 75 mg by mouth daily. 08/17/19  Yes [provider]  febuxostat (ULORIC) 40 MG tablet Take 40 mg by mouth daily. 08/17/19  Yes [provider]  FLUoxetine (PROZAC) 10 MG capsule Take 10 mg by mouth daily. 08/17/19  Yes [provider]  Misc Natural Products (TART CHERRY ADVANCED PO) Take 1,200 mg by mouth daily.   Yes [provider]  MITIGARE 0.6 MG CAPS Take 0.6-1.2 mg by mouth 2 (two) times daily as needed (gout flareup).  06/10/19  Yes [provider]  Multiple Vitamins-Minerals (ONE-A-DAY MENS 50+ PO) Take 1 tablet by mouth daily.   Yes [provider]     No family history on file.  Social History   Socioeconomic History  . Marital status: Married    Spouse name: Not on file  . Number of children: Not on file  . Years of education: Not on file  . Highest education level: Not on file  Occupational History  . Not on file  Tobacco Use  . Smoking status: Never Smoker  . Smokeless tobacco: Never Used  Substance and Sexual Activity  . Alcohol use: Not Currently  . Drug use: Never  . Sexual activity: Not on file  Other Topics Concern  . Not on file  Social History Narrative  . Not on file   Social Determinants of Health   Financial Resource Strain:   . Difficulty of Paying Living Expenses:   Food Insecurity:   . Worried About Programme researcher, broadcasting/film/video in the Last Year:   . Barista in the Last Year:   Transportation Needs:   . Freight forwarder (Medical):   Marland Kitchen Lack of Transportation (Non-Medical):   Physical Activity:   . Days of Exercise per Week:   . Minutes of Exercise per Session:   Stress:   . Feeling of Stress :   Social Connections:   . Frequency of Communication with Friends and Family:   . Frequency of Social Gatherings with Friends and Family:   . Attends Religious Services:     . Active Member of Clubs or Organizations:   . Attends Banker Meetings:   Marland Kitchen Marital Status:      Review of Systems: A 12 point ROS discussed and pertinent positives are indicated in the HPI above.  All other systems are negative.  Review of Systems  Constitutional: Negative for appetite change, chills and fever.  HENT: Negative for tinnitus and trouble swallowing.   Eyes: Positive for visual disturbance ("can't see out of either eye"). Negative for pain.  Respiratory: Negative for shortness of breath.   Cardiovascular: Negative for chest pain.  Gastrointestinal: Negative for abdominal pain, nausea and vomiting.  Musculoskeletal: Positive for gait problem. Negative for back pain.  Neurological: Positive for speech difficulty. Negative for dizziness, seizures, syncope, facial asymmetry, weakness, light-headedness, numbness and headaches.  Psychiatric/Behavioral: Positive for confusion.    Vital Signs: BP 121/71 (BP Location: Right Arm)   Pulse  71   Temp 98.5 F (36.9 C) (Oral)   Resp 16   Ht 5\' 10"  (1.778 m)   Wt (!) 240 lb (108.9 kg)   SpO2 99%   BMI 34.44 kg/m   Physical Exam Vitals and nursing note reviewed.  Constitutional:      General: He is not in acute distress.    Comments: Sitting on side of bed taking medications.  HENT:     Head: Normocephalic.  Cardiovascular:     Rate and Rhythm: Normal rate and regular rhythm.  Pulmonary:     Effort: Pulmonary effort is normal.     Breath sounds: Normal breath sounds.  Abdominal:     Palpations: Abdomen is soft.  Skin:    General: Skin is warm and dry.  Neurological:     Mental Status: He is alert.   Alert, awake, and oriented to self and "hospital" Speech is dysarthric and comprehension in tact No obvious facial asymmetry. Tongue midline Moves all extremities spontaneously Pronator drift - not assessed. Fine motor and coordination - not assessed Gait - not assessed Romberg - not assessed Heel  to toe - not assessed Distal pulses - not assessed    MD Evaluation Airway: WNL Heart: WNL Abdomen: WNL Chest/ Lungs: WNL ASA  Classification: 3 Mallampati/Airway Score: Two   Imaging: CT HEAD WO CONTRAST  Result Date: 08/26/2019 CLINICAL DATA:  Neuro deficit. EXAM: CT HEAD WITHOUT CONTRAST TECHNIQUE: Contiguous axial images were obtained from the base of the skull through the vertex without intravenous contrast. COMPARISON:  MRI of the brain of August 01, 2019 FINDINGS: Brain: No evidence of acute infarction, hemorrhage, hydrocephalus, extra-axial collection or mass lesion/mass effect. Infarcts exhibited recent MRI not well seen on today's study with evidence of remote infarct in the bilateral occipital poles but without signs of acute intracranial process. Vascular: No hyperdense vessel or unexpected calcification. Skull: Normal. Negative for fracture or focal lesion. Sinuses/Orbits: Bilateral maxillary sinus disease, new since previous imaging worse on the RIGHT. Other: None. IMPRESSION: 1. No acute intracranial abnormality. Signs of prior infarct, atrophy and chronic microvascular ischemic change. 2. Infarcts exhibited recent MRI not well seen on today's study with evidence of remote infarct in the bilateral occipital poles but without signs of acute intracranial process. 3. Bilateral maxillary sinus disease, new since previous imaging worse on the RIGHT. Electronically Signed   By: Donzetta KohutGeoffrey  Wile M.D.   On: 08/26/2019 08:32   MR BRAIN WO CONTRAST  Result Date: 08/26/2019 CLINICAL DATA:  Neuro deficit. EXAM: MRI HEAD WITHOUT CONTRAST TECHNIQUE: Multiplanar, multiecho pulse sequences of the brain and surrounding structures were obtained without intravenous contrast. COMPARISON:  08/26/2019 head CT.  08/31/2019 MRI head. FINDINGS: Brain: Multifocal restricted diffusion and correlative T2 hyperintense signal involving the bilateral occipital lobes and anterior left pons. Tiny remote  microhemorrhages involving the bilateral cerebellum. Moderate diffuse parenchymal volume loss. Sequela of remote bilateral occipital and cerebellar infarcts. Smaller remote infarcts are seen involving the dorsal left midbrain, bilateral centrum semiovale, bilateral thalami and left frontal white matter. No midline shift, mass lesion or extra-axial fluid collection. Vascular: FLAIR hyperintense signal within the right V4 segment may reflect slow flow versus occlusion, unchanged from prior exam. Skull and upper cervical spine: Normal marrow signal. Sinuses/Orbits: Normal orbits. Mild-to-moderate pansinus mucosal thickening with layering bilateral maxillary secretions. Trace left mastoid effusion. Other: None. IMPRESSION: Acute infarcts involving the bilateral occipital lobes and anterior left pons. Remote infarcts involving the bilateral occipital lobes, bilateral cerebellum, dorsal left midbrain,  bilateral centrum semiovale, bilateral thalami and left frontal white matter. 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 These results were called by telephone at the time of interpretation on 08/26/2019 at 5:08 pm to provider Dr. Fredderick Phenix, who verbally acknowledged these results. Electronically Signed   By: Stana Bunting M.D.   On: 08/26/2019 17:11   ECHOCARDIOGRAM COMPLETE  Result Date: 08/27/2019    ECHOCARDIOGRAM REPORT   Patient Name:   KADARIOUS DIKES Date of Exam: 08/27/2019 Medical Rec #:  [id number]    Height:       70.0 in Accession #:    [id number]   Weight:       240.0 lb Date of Birth:  07-Mar-1950    BSA:          2.255 m Patient Age:    68 years     BP:           141/92 mmHg Patient Gender: M            HR:           71 bpm. Exam Location:  Inpatient Procedure: 2D Echo, Cardiac Doppler and Color Doppler Indications:    Stroke 434.91 / I163.9  History:        Patient has no prior history of Echocardiogram examinations.                 Previous Myocardial Infarction; Risk  Factors:Dyslipidemia.  Sonographer:    Elmarie Shiley Dance Referring Phys: [id number] AVA SWAYZE IMPRESSIONS  1. Left ventricular ejection fraction, by estimation, is 45 to 50%. The left ventricle has mildly decreased function. The left ventricle demonstrates global hypokinesis. There is mild concentric left ventricular hypertrophy. Left ventricular diastolic parameters are indeterminate.  2. Right ventricular systolic function is normal. The right ventricular size is normal.  3. The mitral valve is normal in structure. Trivial mitral valve regurgitation. No evidence of mitral stenosis.  4. The aortic valve is tricuspid. Aortic valve regurgitation is mild.  5. The inferior vena cava is normal in size with greater than 50% respiratory variability, suggesting right atrial pressure of 3 mmHg. Conclusion(s)/Recommendation(s): No intracardiac source of embolism detected on this transthoracic study. A transesophageal echocardiogram is recommended to exclude cardiac source of embolism if clinically indicated. FINDINGS  Left Ventricle: Left ventricular ejection fraction, by estimation, is 45 to 50%. The left ventricle has mildly decreased function. The left ventricle demonstrates global hypokinesis. The left ventricular internal cavity size was normal in size. There is  mild concentric left ventricular hypertrophy. Left ventricular diastolic parameters are indeterminate. Right Ventricle: The right ventricular size is normal. No increase in right ventricular wall thickness. Right ventricular systolic function is normal. Left Atrium: Left atrial size was normal in size. Right Atrium: Right atrial size was normal in size. Pericardium: Trivial pericardial effusion is present. Presence of pericardial fat pad. Mitral Valve: The mitral valve is normal in structure. Trivial mitral valve regurgitation. No evidence of mitral valve stenosis. Tricuspid Valve: The tricuspid valve is normal in structure. Tricuspid valve regurgitation is trivial. No  evidence of tricuspid stenosis. Aortic Valve: The aortic valve is tricuspid. Aortic valve regurgitation is mild. Aortic regurgitation PHT measures 745 msec. Pulmonic Valve: The pulmonic valve was not well visualized. Pulmonic valve regurgitation is not visualized. Aorta: The aortic root and ascending aorta are structurally normal, with no evidence of dilitation. Venous: The inferior vena cava is normal in size with greater than 50% respiratory variability, suggesting right atrial pressure of  3 mmHg. IAS/Shunts: No atrial level shunt detected by color flow Doppler.  LEFT VENTRICLE PLAX 2D LVIDd:         4.40 cm  Diastology LVIDs:         3.40 cm  LV e' lateral:   4.03 cm/s LV PW:         1.20 cm  LV E/e' lateral: 10.4 LV IVS:        1.40 cm  LV e' medial:    4.79 cm/s LVOT diam:     2.10 cm  LV E/e' medial:  8.8 LV SV:         50 LV SV Index:   22 LVOT Area:     3.46 cm  RIGHT VENTRICLE             IVC RV Basal diam:  2.70 cm     IVC diam: 1.40 cm RV S prime:     10.00 cm/s TAPSE (M-mode): 2.0 cm LEFT ATRIUM             Index       RIGHT ATRIUM          Index LA diam:        4.70 cm 2.08 cm/m  RA Area:     8.83 cm LA Vol (A2C):   53.5 ml 23.72 ml/m RA Volume:   12.50 ml 5.54 ml/m LA Vol (A4C):   34.9 ml 15.47 ml/m LA Biplane Vol: 43.7 ml 19.37 ml/m  AORTIC VALVE LVOT Vmax:   79.15 cm/s LVOT Vmean:  51.500 cm/s LVOT VTI:    0.146 m AI PHT:      745 msec  AORTA Ao Root diam: 3.80 cm Ao Asc diam:  3.75 cm MITRAL VALVE MV Area (PHT): 2.73 cm    SHUNTS MV Decel Time: 278 msec    Systemic VTI:  0.15 m MV E velocity: 42.10 cm/s  Systemic Diam: 2.10 cm MV A velocity: 96.80 cm/s MV E/A ratio:  0.43 Jodelle Red MD Electronically signed by Jodelle Red MD Signature Date/Time: 08/27/2019/2:58:02 PM    Final     Labs:  CBC: Recent Labs    08/26/19 7829 08/26/19 5621 08/26/19 0726 08/26/19 1911 08/27/19 1815 08/28/19 0407  WBC 7.9  --   --  6.9 6.2 5.8  HGB 14.0   < > 13.6 14.4 13.1 12.8*   HCT 40.9   < > 40.0 42.2 37.9* 37.2*  PLT 281  --   --  278 245 220   < > = values in this interval not displayed.    COAGS: Recent Labs    08/26/19 0658  INR 1.0  APTT 28    BMP: Recent Labs    08/26/19 0658 08/26/19 0658 08/26/19 0726 08/26/19 1911 08/27/19 1815 08/28/19 0407  NA 138  --  142  --  138 139  K 3.7  --  3.6  --  3.7 3.5  CL 103  --  102  --  102 105  CO2 25  --   --   --  25 25  GLUCOSE 163*  --  160*  --  129* 114*  BUN 16  --  18  --  15 14  CALCIUM 9.4  --   --   --  8.9 8.9  CREATININE 1.56*   < > 1.60* 1.47* 1.35* 1.44*  GFRNONAA 45*  --   --  48* 54* 50*  GFRAA 52*  --   --  56* >60 57*   < > = values in this interval not displayed.    LIVER FUNCTION TESTS: Recent Labs    08/26/19 0658 08/27/19 1815 08/28/19 0407  BILITOT 0.8 0.8 0.8  AST 23 24 21   ALT 22 23 19   ALKPHOS 106 122 112  PROT 7.4 6.8 6.3*  ALBUMIN 3.4* 3.2* 3.0*    TUMOR MARKERS: No results for input(s): AFPTM, CEA, CA199, CHROMGRNA in the last 8760 hours.  Assessment and Plan:  69 y/o M with history of posterior circulation CVA x 2 (April and June 2021) currently on Plavix and ASA who presented to the ED yesterday due to worsening of slurred speech, imbalance and difficulty walking. 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 He was admitted for further evaluation and management and underwent a CTA head/neck which showed right V4 occluded, left VBJ high grade stenosis, BA patent. NIR has been asked to perform a diagnostic cerebral angiogram to further evaluate the above findings. Dr. 01-08-1978 has discussed case with Dr. July 2021 who approves procedure for 7/26.  I discussed  the procedure via phone with patient's wife Corliss Skains who states understanding and is agreeable for him to proceed. Also discussed basic procedure with patient today who is also agreeable to proceed. Patient to be NPO at midnight 7/26, continue Plavix + ASA, AM INR, IR will call for patient when ready.   Risks and benefits of diagnostic cerebral  angiogram were discussed with the patient's wife [name] including, but not limited to bleeding, infection, vascular injury, stroke, or contrast induced renal failure.  This interventional procedure involves the use of X-rays and because of the nature of the planned procedure, it is possible that we will have prolonged use of X-ray fluoroscopy.  Potential radiation risks to you include (but are not limited to) the following: - A slightly elevated risk for cancer  several years later in life. This risk is typically less than 0.5% percent. This risk is low in comparison to the normal incidence of human cancer, which is 33% for women and 50% for men according to the [employer] Society. - Radiation induced injury can include skin redness, resembling a rash, tissue breakdown / ulcers and hair loss (which can be temporary or permanent).   The likelihood of either of these occurring depends on the difficulty of the procedure and whether you are sensitive to radiation due to previous procedures, disease, or genetic conditions.  IF your procedure requires a prolonged use of radiation, you will be notified and given written instructions for further action.  It is your responsibility to monitor the irradiated area for the 2 weeks following the procedure and to notify your physician if you are concerned that you have suffered a radiation induced injury.    All of the patient's wife's questions were answered, patient and wife are agreeable to proceed.  Consent signed and in IR control room.  Thank you for this interesting consult.  I greatly enjoyed meeting Othmar Ringer and  look forward to participating in their care.  A copy of this report was sent to the requesting provider on this date.  Electronically Signed: [name], PA-C 08/28/2019, 9:23 AM   I spent a total of 40 Minutes  in  face to face in clinical consultation, greater than 50% of which was counseling/coordinating care for diagnostic cerebral angiogram.

## 2019-08-28 NOTE — Plan of Care (Signed)
  Problem: Education: Goal: Knowledge of General Education information will improve Description: Including pain rating scale, medication(s)/side effects and non-pharmacologic comfort measures Outcome: Progressing   Problem: Health Behavior/Discharge Planning: Goal: Ability to manage health-related needs will improve Outcome: Progressing   Problem: Clinical Measurements: Goal: Ability to maintain clinical measurements within normal limits will improve Outcome: Progressing Goal: Will remain free from infection Outcome: Progressing Goal: Diagnostic test results will improve Outcome: Progressing Goal: Respiratory complications will improve Outcome: Progressing Goal: Cardiovascular complication will be avoided Outcome: Progressing   Problem: Activity: Goal: Risk for activity intolerance will decrease Outcome: Progressing   Problem: Nutrition: Goal: Adequate nutrition will be maintained Outcome: Progressing   Problem: Coping: Goal: Level of anxiety will decrease Outcome: Progressing   Problem: Elimination: Goal: Will not experience complications related to bowel motility Outcome: Progressing Goal: Will not experience complications related to urinary retention Outcome: Progressing   Problem: Pain Managment: Goal: General experience of comfort will improve Outcome: Progressing   Problem: Safety: Goal: Ability to remain free from injury will improve Outcome: Progressing   Problem: Skin Integrity: Goal: Risk for impaired skin integrity will decrease Outcome: Progressing   Problem: Education: Goal: Knowledge of disease or condition will improve Outcome: Progressing Goal: Knowledge of secondary prevention will improve Outcome: Progressing Goal: Knowledge of patient specific risk factors addressed and post discharge goals established will improve Outcome: Progressing   Problem: Coping: Goal: Will verbalize positive feelings about self Outcome: Progressing Goal: Will  identify appropriate support needs Outcome: Progressing   Problem: Health Behavior/Discharge Planning: Goal: Ability to manage health-related needs will improve Outcome: Progressing   Problem: Self-Care: Goal: Ability to participate in self-care as condition permits will improve Outcome: Progressing

## 2019-08-28 NOTE — Progress Notes (Signed)
   Palliative Medicine Inpatient Follow Up Note   Reason for consult: Goals of Care "Patient with multiple recent strokes evaluate for change of CODE STATUS to DNR. Address future medical directives"   HPI:  Per intake H&P --> The patient is a 69 yr old man whocarries a past medical history significant for CAD, history of stents, hyperlipidemia, and posterior circulation strokes. In April of 2021 the patient was diagnosed with a CVA of the PCA territory with chronic microhemorhages, as well as thalamic, brainstem, and cerebellar stroke. 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 He had 60% of the left ICA. He was discharged on aspirin and plavix. He was readmitted to [facility] on 07/31/2019 for another posterior circulation stroke. His wife had stopped the ASA 2 weeks prior to this presentation after being told by a home health nurse that he should not be taking both ASA and Plavix. This time he was discharged to rehab on both aspiring and plavix. He was discharged to home and was doing well. [facility]. He again developed dysequilibrium, slurred speech, and difficulty walking. He was seen in an ED in [facility], but refused to stay because of the wait. He came here when he got home.  Palliative care was asked to get involved to help address code status.  Today's Discussion (08/28/2019): Chart reviewed. Patient in bed resting not noted to be in any distress. Not tearful today as he was yesterday.  Called his wife [name] to review what we had talked about yesterday. She stated that she has not reviewed the paperwork provided to her MOST form and "[employer] Choices for [employer]" booklet. I let her know that someone from out team plans to follow up later in the week.She was in agreement with this.   PT/OT Evaluations done -> Home with 24/7 supervision;  Speech evaluation -> regular texture w/ thin liquids.  Plan for patient to have a diagnostic cerebral angiogram tomorrow.  Discussed the importance of continued conversation with family and their  medical providers regarding overall plan of care and treatment options, ensuring decisions are within the context of the patients values and GOCs.  Provided "Hard Choices for [employer]" booklet.   Questions and concerns addressed   SUMMARY OF RECOMMENDATIONS Full Code - Family is reviewing [employer] Cho[employer] book and MOST form, we will talk more about these later in the week. Patient wife requested some time to review them.   Chaplain --> Advanced Directive  Ongoing PMT support patient would benefit from being seen in the community  Time Spent: 25 Greater than 50% of the time was spent in counseling and coordination of care ______________________________________________________________________________________ Lamarr Lulas [facility] Palliative Medicine Team Team Cell Phone: [phone] Please utilize secure chat with additional questions, if there is no response within 30 minutes please call the above phone number  Palliative Medicine Team providers are available by phone from 7am to 7pm daily and can be reached through the team cell phone.  Should this patient require assistance outside of these hours, please call the patient's attending physician.

## 2019-08-29 ENCOUNTER — Inpatient Hospital Stay (HOSPITAL_COMMUNITY): Payer: Medicare HMO

## 2019-08-29 DIAGNOSIS — R471 Dysarthria and anarthria: Secondary | ICD-10-CM | POA: Diagnosis not present

## 2019-08-29 DIAGNOSIS — I63213 Cerebral infarction due to unspecified occlusion or stenosis of bilateral vertebral arteries: Secondary | ICD-10-CM | POA: Diagnosis not present

## 2019-08-29 DIAGNOSIS — I679 Cerebrovascular disease, unspecified: Secondary | ICD-10-CM | POA: Diagnosis not present

## 2019-08-29 HISTORY — PX: IR US GUIDE VASC ACCESS RIGHT: IMG2390

## 2019-08-29 HISTORY — PX: IR ANGIO VERTEBRAL SEL SUBCLAVIAN INNOMINATE UNI R MOD SED: IMG5365

## 2019-08-29 HISTORY — PX: IR ANGIO INTRA EXTRACRAN SEL COM CAROTID INNOMINATE BILAT MOD SED: IMG5360

## 2019-08-29 HISTORY — PX: IR ANGIO VERTEBRAL SEL VERTEBRAL UNI L MOD SED: IMG5367

## 2019-08-29 LAB — PROTIME-INR
INR: 1.1 (ref 0.8–1.2)
Prothrombin Time: 13.5 seconds (ref 11.4–15.2)

## 2019-08-29 LAB — GLUCOSE, CAPILLARY
Glucose-Capillary: 102 mg/dL — ABNORMAL HIGH (ref 70–99)
Glucose-Capillary: 108 mg/dL — ABNORMAL HIGH (ref 70–99)
Glucose-Capillary: 116 mg/dL — ABNORMAL HIGH (ref 70–99)
Glucose-Capillary: 98 mg/dL (ref 70–99)
Glucose-Capillary: 91 mg/dL (ref 70–99)
Glucose-Capillary: 131 mg/dL — ABNORMAL HIGH (ref 70–99)
Glucose-Capillary: 108 mg/dL — ABNORMAL HIGH (ref 70–99)

## 2019-08-29 IMAGING — XA IR ANGIO INTRA EXTRACRAN SEL COM CAROTID INNOMINATE BILAT MOD SE
1 of 2 series · 12 of 24 positions shown · IV contrast (IODINE)
Comparison: CT angiogram of the head and neck [DATE].

CLINICAL DATA: Vertebrobasilar ischemic symptoms. Diagnosis of
severe proximal basilar/dominant left vertebrobasilar junction
stenosis on CT angiogram of the head and neck.

EXAM:
BILATERAL COMMON CAROTID AND INNOMINATE ANGIOGRAPHY
TECHNIQUE: Informed written consent was obtained from the patient after a
thorough discussion of the procedural risks, benefits and
alternatives. All questions were addressed. Maximal Sterile Barrier
Technique was utilized including caps, mask, sterile gowns, sterile
gloves, sterile drape, hand hygiene and skin antiseptic. A timeout
was performed prior to the initiation of the procedure.

[Series 300: dr. (person_name). · 12 of 255 slices shown]
[im 1/255]
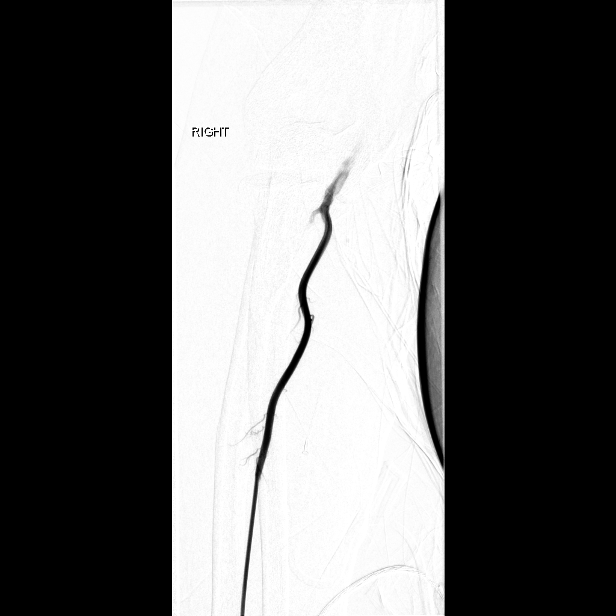
[im 24/255]
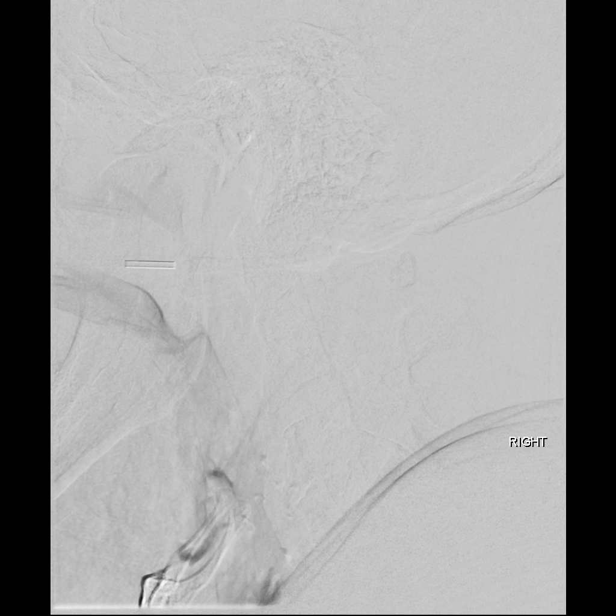
[im 47/255]
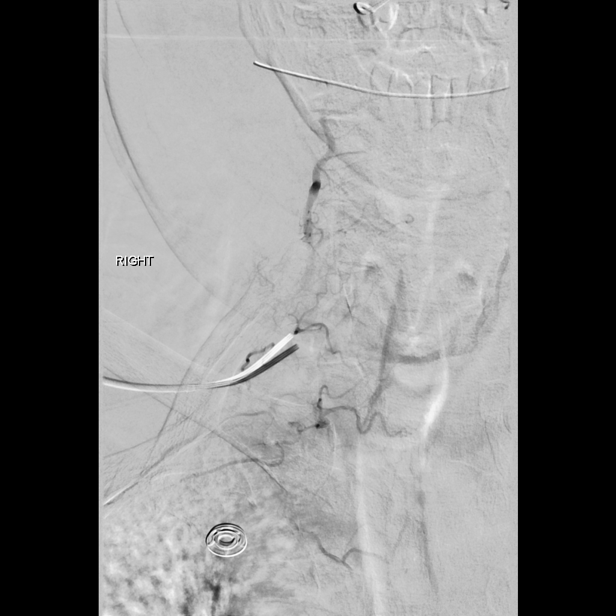
[im 70/255]
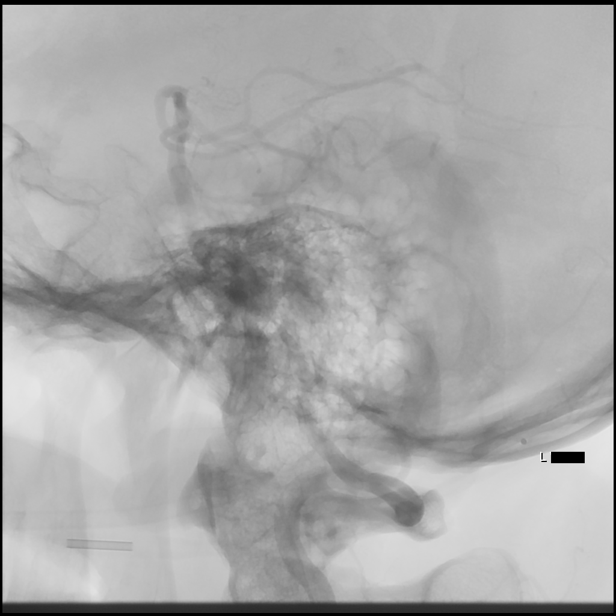
[im 93/255]
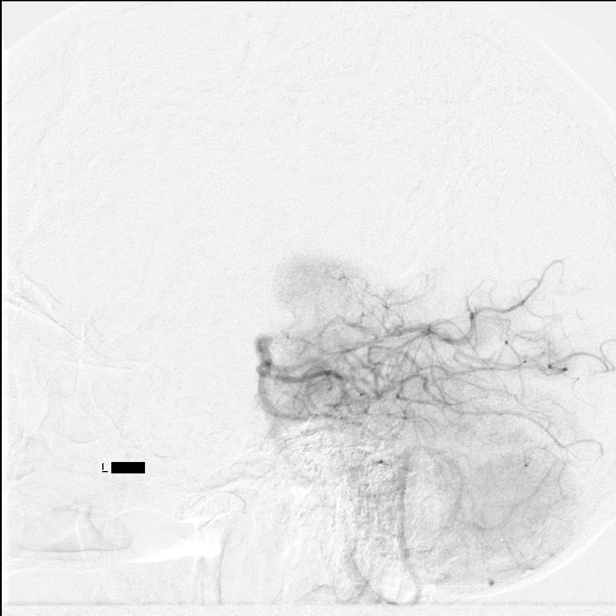
[im 116/255]
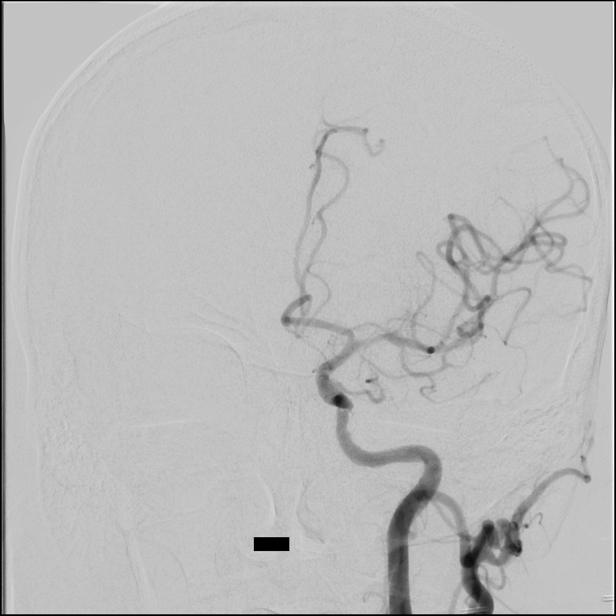
[im 139/255]
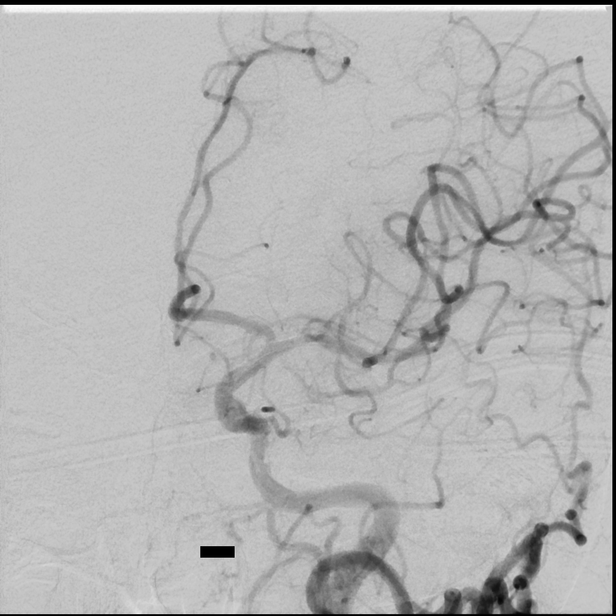
[im 162/255]
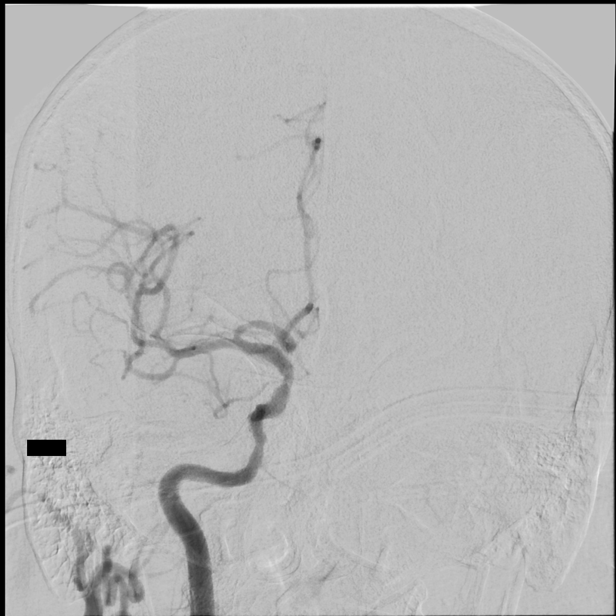
[im 185/255]
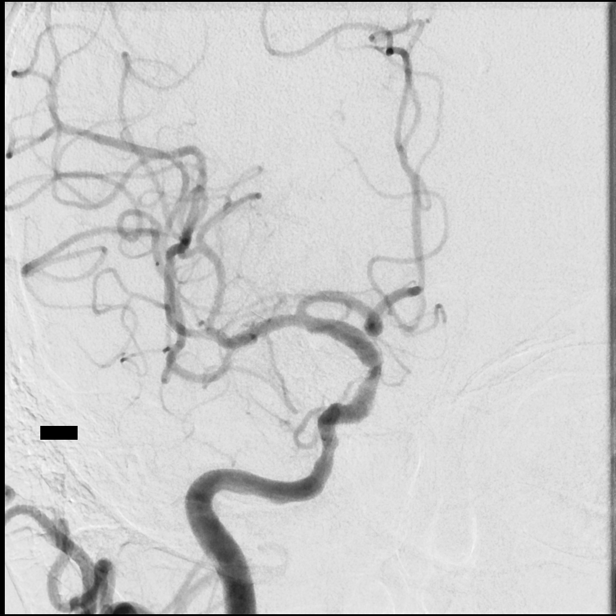
[im 208/255]
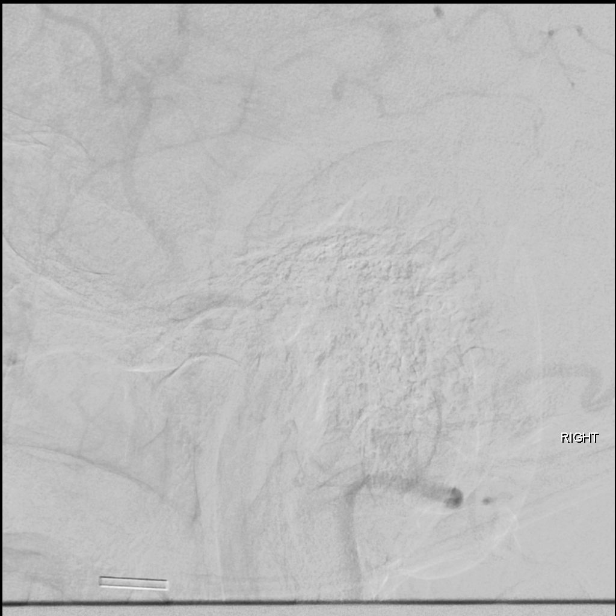
[im 231/255]
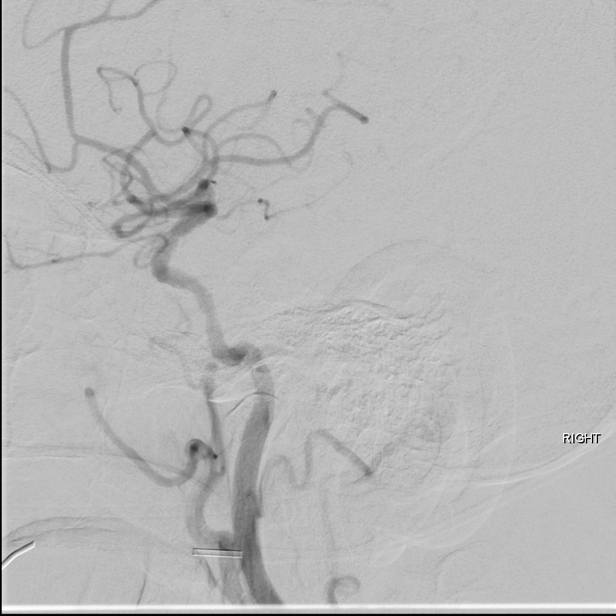
[im 255/255]
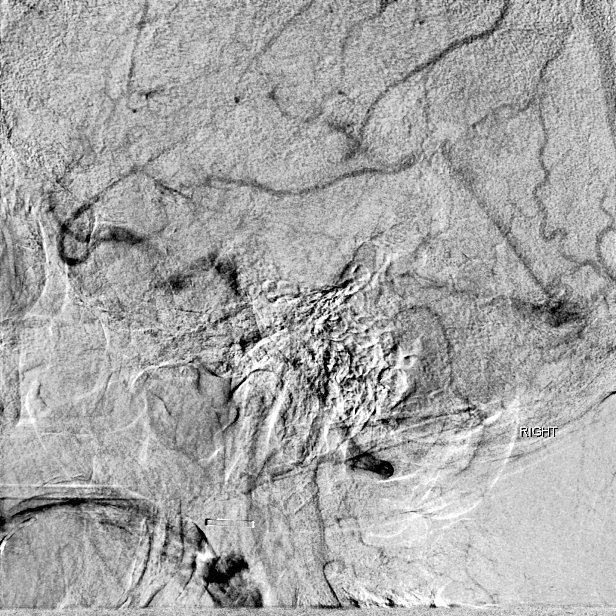

[12 of 24 positions shown; findings below may reference images not displayed]

MEDICATIONS:
Heparin [VV] units IA. No antibiotic was administered within 1 hour
of the procedure.

ANESTHESIA/SEDATION:
Versed 1 mg IV; Fentanyl 25 mcg IV

Moderate Sedation Time:  47 minutes

The patient was continuously monitored during the procedure by the
interventional radiology nurse under my direct supervision.

CONTRAST:  Isovue 300 approximately 70 cc

FLUOROSCOPY TIME:  Fluoroscopy Time: 12 minutes 48 seconds ([VV]
mGy).

COMPLICATIONS:
None immediate.
The right forearm to the wrist was prepped and draped in the usual
sterile manner.

The right radial artery was then identified with ultrasound, and its
morphology documented. A dorsal palmar anastomosis was verified to
be present.

Using a micropuncture set and ultrasound guidance, access into the
right radial artery was obtained over a 0.018 inch micro guidewire.
A [DATE] French radial sheath was then inserted. The obturator, and the
micro guidewire were removed. Good aspiration obtained from the side
port of the sheath. A cocktail of [VV] units of heparin, 2.5 mg of
verapamil, and 200 mcg of nitroglycerin was then infused in diluted
form through the sheath. A right radial arteriogram was then
performed.

A 5 PORQUET 2 diagnostic catheter was then advanced to the
aortic arch region, and select cannulation was performed of the left
vertebral artery, the right common carotid artery, the left common
carotid artery, and just proximal to the origin of the right
vertebral artery.

Following the procedure, hemostasis at the right radial puncture
site was achieved with a wrist band. Distal right radial pulse was
verified to be present.
FINDINGS: The origin of the right radial artery appears to be moderately
stenosed. More distally the vessel is seen to ascend to the cranial
skull base with opacification of the right posterior-inferior
cerebellar artery and the vertebrobasilar junction just distal to
this.

No contrast is seen distal to this in the basilar artery. The
dominant left vertebral artery origin is widely patent.

The vessel has a mild to moderate tortuosity just distal to its
origin.

More distally the vessel is seen to opacify to the cranial skull
base. Wide patency is seen of the left vertebrobasilar junction
proximal to the left posterior-inferior cerebellar artery.

A severe high-grade stenosis is seen at the distal left
vertebrobasilar junction, at its confluence with the basilar artery.
Distal to the basilar artery the posterior cerebral arteries, the
superior cerebellar arteries and the anterior-inferior cerebellar
arteries opacify into the capillary and venous phases. Scattered
focal areas of mild caliber irregularity is seen involving the
proximal right PCA, the superior cerebellar arteries and the left
posterior-inferior cerebellar artery. The left common carotid
arteriogram demonstrates mild stenosis at the origin of the left
external carotid artery. Its branches opacify widely. The left
internal carotid artery at the bulb appears to have a moderate-sized
ulcerated plaque associated with mild stenosis. No evidence of
intraluminal filling defects is seen. More distally the vessel is
seen to opacify to the cranial skull base. There is mild stenosis at
the petrous cavernous junction, and the caval cavernous region. More
distally the cavernous and the supraclinoid segments are widely
patent.

The left middle cerebral artery and the left anterior cerebral
artery opacify into the capillary and venous phases.

There is approximately 50% stenosis of the proximal middle cerebral
artery in the M1 segment. The right common carotid arteriogram
demonstrates the right external carotid artery and its major
branches to be widely patent.

The right internal carotid artery at the bulb has a small ulcerated
plaque along the posterior wall. No evidence of significant stenosis
is seen. More distally the vessel is seen to opacify to the cranial
skull base. There is mild narrowing of the petrous cavernous
junction. More distally the cavernous and the supraclinoid segments
are widely patent.

The right middle cerebral artery and the right anterior cerebral
artery opacify into the capillary and venous phases.

Mild stenosis of the right anterior cerebral A1 segment at its
origin is noticeable.
IMPRESSION: Severe high-grade stenosis of the dominant left vertebrobasilar
junction just proximal to the confluence with the basilar artery.

Angiographically occluded non dominant occluded right
vertebrobasilar junction distal to the right posterior-inferior
cerebellar artery.

Approximately 50% stenosis of the left middle cerebral artery
proximal M1 segment probably due to ICAD.

PLAN:
Angiographic findings reviewed with the patient and his family, and
with the stroke neurologist.

## 2019-08-29 MED ORDER — HEPARIN SODIUM (PORCINE) 1000 UNIT/ML IJ SOLN
INTRAMUSCULAR | Status: AC
  Filled 2019-08-29: qty 1

## 2019-08-29 MED ORDER — FENTANYL CITRATE (PF) 100 MCG/2ML IJ SOLN
INTRAMUSCULAR | Status: AC
  Filled 2019-08-29: qty 2

## 2019-08-29 MED ORDER — HEPARIN SODIUM (PORCINE) 1000 UNIT/ML IJ SOLN
INTRAMUSCULAR | Status: AC | PRN
  Administered 2019-08-29: 2000 [IU] via INTRA_ARTERIAL

## 2019-08-29 MED ORDER — IOHEXOL 300 MG/ML  SOLN
150.0000 mL | Freq: Once | INTRAMUSCULAR | Status: AC | PRN
  Administered 2019-08-29: 75 mL via INTRA_ARTERIAL

## 2019-08-29 MED ORDER — TICAGRELOR 90 MG PO TABS
90.0000 mg | ORAL_TABLET | Freq: Two times a day (BID) | ORAL | Status: DC
  Administered 2019-08-30 – 2019-09-01 (×5): 90 mg via ORAL
  Filled 2019-08-29 (×6): qty 1

## 2019-08-29 MED ORDER — NITROGLYCERIN 1 MG/10 ML FOR IR/CATH LAB
INTRA_ARTERIAL | Status: AC
  Filled 2019-08-29: qty 10

## 2019-08-29 MED ORDER — IOHEXOL 300 MG/ML  SOLN
50.0000 mL | Freq: Once | INTRAMUSCULAR | Status: AC | PRN
  Administered 2019-08-29: 25 mL via INTRA_ARTERIAL

## 2019-08-29 MED ORDER — NITROGLYCERIN 1 MG/10 ML FOR IR/CATH LAB
INTRA_ARTERIAL | Status: AC | PRN
  Administered 2019-08-29 (×2): 200 ug via INTRA_ARTERIAL

## 2019-08-29 MED ORDER — FENTANYL CITRATE (PF) 100 MCG/2ML IJ SOLN
INTRAMUSCULAR | Status: AC | PRN
  Administered 2019-08-29: 25 ug via INTRAVENOUS

## 2019-08-29 MED ORDER — MIDAZOLAM HCL 2 MG/2ML IJ SOLN
INTRAMUSCULAR | Status: AC | PRN
  Administered 2019-08-29: 1 mg via INTRAVENOUS

## 2019-08-29 MED ORDER — SODIUM CHLORIDE 0.9 % IV SOLN: INTRAVENOUS | Status: DC

## 2019-08-29 MED ORDER — ASPIRIN EC 81 MG PO TBEC
81.0000 mg | DELAYED_RELEASE_TABLET | Freq: Every day | ORAL | Status: DC
  Administered 2019-08-29 – 2019-08-31 (×3): 81 mg via ORAL
  Filled 2019-08-29 (×3): qty 1

## 2019-08-29 MED ORDER — VERAPAMIL HCL 2.5 MG/ML IV SOLN
INTRAVENOUS | Status: AC | PRN
  Administered 2019-08-29: 2.5 mg via INTRAVENOUS

## 2019-08-29 MED ORDER — LIDOCAINE HCL 1 % IJ SOLN
INTRAMUSCULAR | Status: AC
  Filled 2019-08-29: qty 20

## 2019-08-29 MED ORDER — MIDAZOLAM HCL 2 MG/2ML IJ SOLN
INTRAMUSCULAR | Status: AC
  Filled 2019-08-29: qty 2

## 2019-08-29 MED ORDER — VERAPAMIL HCL 2.5 MG/ML IV SOLN
INTRAVENOUS | Status: AC
  Filled 2019-08-29: qty 2

## 2019-08-29 MED ORDER — TICAGRELOR 90 MG PO TABS
180.0000 mg | ORAL_TABLET | Freq: Once | ORAL | Status: AC
  Administered 2019-08-29: 180 mg via ORAL
  Filled 2019-08-29: qty 2

## 2019-08-29 NOTE — Progress Notes (Signed)
STROKE TEAM PROGRESS NOTE   INTERVAL HISTORY Wife and son are at bedside. Pt back from angio suite.  Left VBJ 95% stenosis. Right VA distal occlusion. Discussed with pt and family regarding the risk of recurrent stroke is high for such diseased vessels. Also discussed about benefit and risk of intracranial stenting, they are willing to proceed. Discussed with Dr. Corliss Skains, will plan for procedure Thursday. Will change plavix to brilinta.   OBJECTIVE Vitals:   08/28/19 1949 08/28/19 2355 08/29/19 0414 08/29/19 0935  BP: 127/78   (!) 136/93  Pulse:    73  Resp:    14  Temp: 98.4 F (36.9 C) 98.1 F (36.7 C) 98.3 F (36.8 C)   TempSrc: Oral Oral Oral   SpO2:    96%  Weight:      Height:       CBC:  Recent Labs  Lab 08/27/19 1815 08/28/19 0407  WBC 6.2 5.8  NEUTROABS 3.8 3.3  HGB 13.1 12.8*  HCT 37.9* 37.2*  MCV 92.4 92.3  PLT 245 220   Basic Metabolic Panel:  Recent Labs  Lab 08/27/19 1815 08/28/19 0407  NA 138 139  K 3.7 3.5  CL 102 105  CO2 25 25  GLUCOSE 129* 114*  BUN 15 14  CREATININE 1.35* 1.44*  CALCIUM 8.9 8.9  MG 1.8 2.0  PHOS 3.6 3.5   Lipid Panel:     Component Value Date/Time   CHOL 96 08/27/2019 1815   TRIG 96 08/27/2019 1815   HDL 22 (L) 08/27/2019 1815   CHOLHDL 4.4 08/27/2019 1815   VLDL 19 08/27/2019 1815   LDLCALC 55 08/27/2019 1815   HgbA1c:  Lab Results  Component Value Date   HGBA1C 6.2 (H) 08/27/2019   Urine Drug Screen: No results found for: LABOPIA, COCAINSCRNUR, LABBENZ, AMPHETMU, THCU, LABBARB  Alcohol Level No results found for: [facility]  IMAGING  CT HEAD WO CONTRAST 08/26/2019 1. No acute intracranial abnormality. Signs of prior infarct, atrophy and chronic microvascular ischemic change.  2. Infarcts exhibited recent MRI not well seen on today's study with evidence of remote infarct in the bilateral occipital poles but without signs of acute intracranial process.  3. Bilateral maxillary sinus disease, new since previous  imaging worse on the RIGHT.    MR BRAIN WO CONTRAST 08/26/2019 Acute infarcts involving the bilateral occipital lobes and anterior left pons. Remote infarcts involving the bilateral occipital lobes, bilateral cerebellum, dorsal left midbrain, bilateral centrum semiovale, bilateral thalami and left frontal white matter. 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 2.Oclluded v severe stenosis of non dominant RT VBJ marred by motion artifact. 3.Approx 50 % stenosis LT MCA M 1 seg.  ECHOCARDIOGRAM COMPLETE 08/27/2019 1. Left ventricular ejection fraction, by estimation, is 45 to 50%. The left ventricle has mildly decreased function. The left ventricle demonstrates global hypokinesis. There is mild concentric left ventricular hypertrophy. Left ventricular diastolic parameters are indeterminate.  2. Right ventricular systolic function is normal. The right ventricular size is normal.  3. The mitral valve is normal in structure. Trivial mitral valve regurgitation. No evidence of mitral stenosis.  4. The aortic valve is tricuspid. Aortic valve regurgitation is mild.  5. The inferior vena cava is normal in size with greater than 50% respiratory variability, suggesting right atrial pressure of 3 mmHg. Conclusion(s)/Recommendation(s): No intracardiac source of embolism detected on this  transthoracic study.  ECG - SR rate 83 BPM. (See cardiology reading for complete details)   PHYSICAL EXAM    Temp:  [98.1 F (36.7 C)-98.4 F (36.9 C)] 98.3 F (36.8 C) (07/26 0414) Pulse Rate:  [73] 73 (07/26 0935) Resp:  [14] 14 (07/26 0935) BP: (127-136)/(78-93) 136/93 (07/26 0935) SpO2:  [95 %-96 %] 96 % (07/26 0935)  General - Well nourished, well developed, in no apparent distress.  Ophthalmologic - fundi not visualized due to  noncooperation.  Cardiovascular - Regular rhythm and rate.  Neuro - awake, alert, orientated to time place and people.  No aphasia, however moderate dysarthria.  Follows simple commands, able to name and repeat.  Visual field grossly full with finger moving test but not counting fingers, decreased visual acuity bilaterally.  Extraocular movement grossly intact, denies diplopia.  Right nasolabial fold flattening, tongue midline.  Right upper extremity 4/5 with pronator drift.  Right upper extremity and bilateral lower extremity 5/5.  Bilateral finger-to-nose intact, however bilateral heel-to-shin mild dysmetria.  Sensation symmetrical.  Gait not tested.   ASSESSMENT/PLAN [name] is a 69 y.o. male with history of HLD (hyperlipidemia), MI (myocardial infarction) (HCC), and prior Strokes (HCC) who presents with dysarthria.  He did not receive IV t-PA.  Strokes: Acute infarcts involving the bilateral occipital lobes and anterior left pons - large vessel disease due to VA stenosis/occlusion  CT head -  No acute intracranial abnormality  MRI head - Acute infarcts involving the bilateral occipital lobes and anterior left pons. Remote infarcts involving the bilateral occipital lobes, bilateral cerebellum, dorsal left midbrain, bilateral centrum semiovale, bilateral thalami and left frontal white matter. 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   CTA head and neck - right V4 occluded. Left VBJ high grade stenosis, BA patent  Cerebral angio - Left VBJ 95% stenosis. Right VA distal occlusion.  2D Echo - EF 45-50%  Sars Corona Virus 2 - negative  LDL - 55  HgbA1c 6.2  VTE prophylaxis - [place] Heparin  aspirin 81 mg daily and clopidogrel 75 mg daily prior to admission, now on clopidogrel 75 mg daily and ASA 325. Will change to ASA 81 and brilinta in preparation for stenting  Patient counseled to be compliant with his antithrombotic medications  Ongoing  aggressive stroke risk factor management  Therapy recommendations:  [facility]  Disposition:  Pending  Recurrent posterior infarcts  05/2019 MRI bilateral occipital PCA infarcts, CTA head and neck positive for Severe stenosis of the bilateral VBJ, and moderate to severe stenoses of both proximal Vertebral Arteries.  60% stenosis Left ICA bulb due to soft plaque. Moderate bilateral. ICA siphon stenosis due to bulky plaque. Mild left MCA M1 stenosis. Mild to moderate ACA A2 stenoses.  07/2019 Multiple acute infarcts of the cerebellumand occipital lobes  bilaterally as well as the left thalamus and posterior left mid brain. CTA H&N- June 2021- severe bilateral vertebrobasilar junction stenosis and advanced atherosclerotic changes elsewhere in the head and neck including moderate to severe bilateral vertebral artery origin stenosis, 60% stenosis left ICA bulb.  cerebral angio Left VBJ 95% stenosis. Right VA distal occlusion.  Plan for VBJ stenting on Thursday  Hypertension  Home BP meds: Norvasc ; Coreg  Current BP meds: None  Stable at 120-140 . Permissive hypertension (OK if < 220/120) but gradually normalize in 5-7 days  . Long-term BP goal 130-160 give posterior stenosis  Hyperlipidemia  Home Lipid lowering medication: Lipitor 80 mg daily  LDL - 55 goal < 70  Current lipid lowering medication: Lipitor 80 mg daily   Continue statin at discharge  DM type II, controlled  HgbA1c 6.2  No home meds  Very sensitive SSI  Other Stroke Risk Factors  Advanced age  Previous ETOH use.  Obesity, Body mass index is 34.44 kg/m., recommend weight loss, diet and exercise as appropriate   Coronary artery disease  Other Active Problems  Code status - Full code  CKD - stage 3a - creatinine - 1.56->1.60->1.47->1.35->1.44  Palliative Care Consult - 08/28/19 - Full code. Wife reviewing literature. OP f/u.   Hospital day # 2  Marvel Plan, MD PhD Stroke Neurology 08/29/2019 11:37  AM   To contact Stroke Continuity provider, please refer to [email or web address]. After hours, contact General Neurology

## 2019-08-29 NOTE — Procedures (Signed)
S/P 4 vessel cerebreal arteriogram RT Rad approach./ \Findings. 1.Severe symptomatic stenosis of dominant Lt VBJ prox to basilar artery 2. 2.Oclluded v severe stenosis of non dominant RT VBJ marred by motion artifact. 3.Approx 50 % stenosis LT MCA M 1 seg. S.Palak Tercero MD

## 2019-08-29 NOTE — Sedation Documentation (Signed)
Patient transported to [address] after short discussion of findings post procedure. Right wrist assessed with floor nurse. Intact pulses and no bleeding noted from TR Band.

## 2019-08-29 NOTE — Progress Notes (Signed)
PROGRESS NOTE    [name]  QPY:[id number] DOB: 25-Feb-1950 DOA: 08/26/2019 PCP: Katherine Basset, PA-C     Brief Narrative:  69 yr old WM PMHx CAD, history of stents, HLD, Posterior circulation CVA . In April of 2021 the patient was diagnosed with a CVA of the PCA territory with chronic microhemorhages, as well as thalamic, brainstem, and cerebellar stroke. 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 He had 60% of the left ICA.  He was discharged on aspirin and plavix. He was readmitted to [facility] on 07/31/2019 for another posterior circulation stroke. His wife had stopped the ASA 2 weeks prior to this presentation after being told by a home health nurse that he should not be taking both ASA and Plavix. This time he was discharged to rehab on both aspiring and plavix. He was discharged to home and was doing well. He went to [facility] with his family. He again developed dysequilibrium, slurred speech, and difficulty walking. He was seen in an ED in [facility], but refused to stay because of the wait. He came here when he got home.  In the ED the patient was found to have normal vital signs. Creatinine was 1.60, and a normal CBC.  CT head demonstrated signs of prior infarct, atrophy, and chronic microvascular ischemic changes, and bilateral maxillary sinus disease.  MRI Brain demonstrated acute infarcts of the bilateral occipital lobes and anterior left pons. There was also evidence of remote infarcts involving the bilateral occipital lobes, bilateral cerebellum, dorsal left midbrain, bilateral centrum semiovale, bilateral thalami, and left frontal white matter. 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  Neurology was called from the ED and recommended admission and repeat vessel imaging to rule out an acute thrombus and to rule out  other causes . The patient also should be admitted to monitor for worsening of symptoms.  The patient denies fevers, chills, nausea, vomiting, cough, chest pain, shortness of breath. He denies hematemesis, melena, hematochezia, or coffee ground emesis.     Subjective: 7/26 to see patient 3 times in surgery no charge   Assessment & Plan: Covid vaccination; negative vaccination   Active Problems:   Stroke (HCC)   Slurred speech   Dyslipidemia   CKD (chronic kidney disease), stage III   DM II (diabetes mellitus, type II), controlled (HCC)   Cerebrovascular disease   Palliative care by specialist   Goals of care, counseling/discussion   Stroke (cerebrum) (HCC)  Acute on Chronic CVA -Acute infarct involving bilateral occipital lobes and anterior LEFT pons.  Embolic?  Source unknown. -CTA head/neck pending -Echocardiogram pending -7/25 discussed case with Dr.Xu stroke team plan is for in the A.m. patient to go to lab and determine if there is vasculature in the posterior circulation which can be stented open.   Essential HTN -Allow permissive HTN -Coreg 3.125 mg BID (hold)  HLD -Atorvastatin 80 mg daily -Lipid panel pending  DM type II uncontrolled with complication -Hemoglobin A1c pending -Very sensitive SSI  CKD stage IIIa (baseline Cr 1.56 no previous creatinine for comparison) -Hold all nephrotoxic medication -Monitor closely Recent Labs  Lab 08/26/19 0658 08/26/19 0726 08/26/19 1911 08/27/19 1815 08/28/19 0407  CREATININE 1.56* 1.60* 1.47* 1.35* 1.44*       Goals of care -7/24; Palliative Care Consult; patient with multiple recent strokes evaluate for change of CODE STATUS to  DNR. Address future medical directives   DVT prophylaxis: Subcu heparin Code Status: Full Family Communication:  Status is: Inpatient    Dispo: The patient is from: Home              Anticipated d/c is to: CIR vs SNF              Anticipated d/c date is: 7/30               Patient currently unstable      Consultants:  Stroke team  Procedures/Significant Events:  CTA head/neck pending    I have personally reviewed and interpreted all radiology studies and my findings are as above.  VENTILATOR SETTINGS:    Cultures   Antimicrobials:    Devices    LINES / TUBES:      Continuous Infusions: . sodium chloride Stopped (08/27/19 0042)  . sodium chloride 75 mL/hr at 08/29/19 0735     Objective: Vitals:   08/28/19 1949 08/28/19 2355 08/29/19 0414 08/29/19 0935  BP: 127/78   (!) 136/93  Pulse:    73  Resp:    14  Temp: 98.4 F (36.9 C) 98.1 F (36.7 C) 98.3 F (36.8 C)   TempSrc: Oral Oral Oral   SpO2:    96%  Weight:      Height:        Intake/Output Summary (Last 24 hours) at 08/29/2019 1135 Last data filed at 08/29/2019 0300 Gross per 24 hour  Intake 804.58 ml  Output 800 ml  Net 4.58 ml   Filed Weights   08/27/19 0313  Weight: (!) 108.9 kg    Examination:  Attempted to see patient 3 times in surgery no charge  .     Data Reviewed: Care during the described time interval was provided by me .  I have reviewed this patient's available data, including medical history, events of note, physical examination, and all test results as part of my evaluation.  CBC: Recent Labs  Lab 08/26/19 0658 08/26/19 0726 08/26/19 1911 08/27/19 1815 08/28/19 0407  WBC 7.9  --  6.9 6.2 5.8  NEUTROABS 5.4  --   --  3.8 3.3  HGB 14.0 13.6 14.4 13.1 12.8*  HCT 40.9 40.0 42.2 37.9* 37.2*  MCV 95.1  --  94.8 92.4 92.3  PLT 281  --  278 245 220   Basic Metabolic Panel: Recent Labs  Lab 08/26/19 0658 08/26/19 0726 08/26/19 1911 08/27/19 1815 08/28/19 0407  NA 138 142  --  138 139  K 3.7 3.6  --  3.7 3.5  CL 103 102  --  102 105  CO2 25  --   --  25 25  GLUCOSE 163* 160*  --  129* 114*  BUN 16 18  --  15 14  CREATININE 1.56* 1.60* 1.47* 1.35* 1.44*  CALCIUM 9.4  --   --  8.9 8.9  MG  --   --   --  1.8 2.0  PHOS  --    --   --  3.6 3.5   GFR: Estimated Creatinine Clearance: 60.7 mL/min (A) (by C-G formula based on SCr of 1.44 mg/dL (H)). Liver Function Tests: Recent Labs  Lab 08/26/19 0658 08/27/19 1815 08/28/19 0407  AST ALT ALKPHOS 106 122 112  BILITOT 0.8 0.8 0.8  PROT 7.4 6.8 6.3*  ALBUMIN 3.4* 3.2* 3.0*   No results for input(s): LIPASE, AMYLASE in the last  168 hours. No results for input(s): AMMONIA in the last 168 hours. Coagulation Profile: Recent Labs  Lab 08/26/19 0658  INR 1.0   Cardiac Enzymes: No results for input(s): CKTOTAL, CKMB, CKMBINDEX, TROPONINI in the last 168 hours. BNP (last 3 results) No results for input(s): PROBNP in the last 8760 hours. HbA1C: Recent Labs    08/27/19 0652 08/27/19 1815  HGBA1C 6.1* 6.2*   CBG: Recent Labs  Lab 08/28/19 1546 08/28/19 2103 08/29/19 0111 08/29/19 0420 08/29/19 0801  GLUCAP 106* 109* 102* 108* 116*   Lipid Profile: Recent Labs    08/27/19 0652 08/27/19 1815  CHOL 100 96  HDL 21* 22*  LDLCALC 54 55  TRIG 124 96  CHOLHDL 4.8 4.4   Thyroid Function Tests: No results for input(s): TSH, T4TOTAL, FREET4, T3FREE, THYROIDAB in the last 72 hours. Anemia Panel: No results for input(s): VITAMINB12, FOLATE, FERRITIN, TIBC, IRON, RETICCTPCT in the last 72 hours. Sepsis Labs: No results for input(s): PROCALCITON, LATICACIDVEN in the last 168 hours.  Recent Results (from the past 240 hour(s))  SARS Coronavirus 2 by RT PCR (hospital order, performed in [facility] hospital lab) Nasopharyngeal Nasopharyngeal Swab     Status: None   Collection Time: 08/26/19  3:00 PM   Specimen: Nasopharyngeal Swab  Result Value Ref Range Status   SARS Coronavirus 2 NEGATIVE NEGATIVE Final    Comment: (NOTE) SARS-CoV-2 target nucleic acids are NOT DETECTED.  The SARS-CoV-2 RNA is generally detectable in upper and lower respiratory specimens during the acute phase of infection. The lowest concentration of SARS-CoV-2  viral copies this assay can detect is 250 copies / mL. A negative result does not preclude SARS-CoV-2 infection and should not be used as the sole basis for treatment or other patient management decisions.  A negative result may occur with improper specimen collection / handling, submission of specimen other than nasopharyngeal swab, presence of viral mutation(s) within the areas targeted by this assay, and inadequate number of viral copies (<250 copies / mL). A negative result must be combined with clinical observations, patient history, and epidemiological information.  Fact Sheet for Patients:   BoilerBrush.com.cy  Fact Sheet for Healthcare Providers: https://pope.com/  This test is not yet approved or  cleared by the [place] FDA and has been authorized for detection and/or diagnosis of SARS-CoV-2 by FDA under an Emergency Use Authorization (EUA).  This EUA will remain in effect (meaning this test can be used) for the duration of the COVID-19 declaration under Section 564(b)(1) of the Act, 21 U.S.C. section 360bbb-3(b)(1), unless the authorization is terminated or revoked sooner.  Performed at [facility], [address] [place], [place] [address]          Radiology Studies: ECHOCARDIOGRAM COMPLETE  Result Date: 08/27/2019    ECHOCARDIOGRAM REPORT   Patient Name:   [name] Date of Exam: 08/27/2019 Medical Rec #:  [id number]    Height:       70.0 in Accession #:    [id number]   Weight:       240.0 lb Date of Birth:  20-Jun-1950    BSA:          2.255 m Patient Age:    68 years     BP:           141/92 mmHg Patient Gender: M            HR:           71 bpm. Exam Location:  Inpatient Procedure: 2D Echo, Cardiac Doppler and Color Doppler Indications:    Stroke 434.91 / I163.9  History:        Patient has no prior history of Echocardiogram examinations.                 Previous Myocardial Infarction; Risk  Factors:Dyslipidemia.  Sonographer:    Elmarie Shileyiffany Dance Referring Phys: [id number] AVA SWAYZE IMPRESSIONS  1. Left ventricular ejection fraction, by estimation, is 45 to 50%. The left ventricle has mildly decreased function. The left ventricle demonstrates global hypokinesis. There is mild concentric left ventricular hypertrophy. Left ventricular diastolic parameters are indeterminate.  2. Right ventricular systolic function is normal. The right ventricular size is normal.  3. The mitral valve is normal in structure. Trivial mitral valve regurgitation. No evidence of mitral stenosis.  4. The aortic valve is tricuspid. Aortic valve regurgitation is mild.  5. The inferior vena cava is normal in size with greater than 50% respiratory variability, suggesting right atrial pressure of 3 mmHg. Conclusion(s)/Recommendation(s): No intracardiac source of embolism detected on this transthoracic study. A transesophageal echocardiogram is recommended to exclude cardiac source of embolism if clinically indicated. FINDINGS  Left Ventricle: Left ventricular ejection fraction, by estimation, is 45 to 50%. The left ventricle has mildly decreased function. The left ventricle demonstrates global hypokinesis. The left ventricular internal cavity size was normal in size. There is  mild concentric left ventricular hypertrophy. Left ventricular diastolic parameters are indeterminate. Right Ventricle: The right ventricular size is normal. No increase in right ventricular wall thickness. Right ventricular systolic function is normal. Left Atrium: Left atrial size was normal in size. Right Atrium: Right atrial size was normal in size. Pericardium: Trivial pericardial effusion is present. Presence of pericardial fat pad. Mitral Valve: The mitral valve is normal in structure. Trivial mitral valve regurgitation. No evidence of mitral valve stenosis. Tricuspid Valve: The tricuspid valve is normal in structure. Tricuspid valve regurgitation is trivial. No  evidence of tricuspid stenosis. Aortic Valve: The aortic valve is tricuspid. Aortic valve regurgitation is mild. Aortic regurgitation PHT measures 745 msec. Pulmonic Valve: The pulmonic valve was not well visualized. Pulmonic valve regurgitation is not visualized. Aorta: The aortic root and ascending aorta are structurally normal, with no evidence of dilitation. Venous: The inferior vena cava is normal in size with greater than 50% respiratory variability, suggesting right atrial pressure of 3 mmHg. IAS/Shunts: No atrial level shunt detected by color flow Doppler.  LEFT VENTRICLE PLAX 2D LVIDd:         4.40 cm  Diastology LVIDs:         3.40 cm  LV e' lateral:   4.03 cm/s LV PW:         1.20 cm  LV E/e' lateral: 10.4 LV IVS:        1.40 cm  LV e' medial:    4.79 cm/s LVOT diam:     2.10 cm  LV E/e' medial:  8.8 LV SV:         50 LV SV Index:   22 LVOT Area:     3.46 cm  RIGHT VENTRICLE             IVC RV Basal diam:  2.70 cm     IVC diam: 1.40 cm RV S prime:     10.00 cm/s TAPSE (M-mode): 2.0 cm LEFT ATRIUM             Index       RIGHT ATRIUM  Index LA diam:        4.70 cm 2.08 cm/m  RA Area:     8.83 cm LA Vol (A2C):   53.5 ml 23.72 ml/m RA Volume:   12.50 ml 5.54 ml/m LA Vol (A4C):   34.9 ml 15.47 ml/m LA Biplane Vol: 43.7 ml 19.37 ml/m  AORTIC VALVE LVOT Vmax:   79.15 cm/s LVOT Vmean:  51.500 cm/s LVOT VTI:    0.146 m AI PHT:      745 msec  AORTA Ao Root diam: 3.80 cm Ao Asc diam:  3.75 cm MITRAL VALVE MV Area (PHT): 2.73 cm    SHUNTS MV Decel Time: 278 msec    Systemic VTI:  0.15 m MV E velocity: 42.10 cm/s  Systemic Diam: 2.10 cm MV A velocity: 96.80 cm/s MV E/A ratio:  0.43 Jodelle Red MD Electronically signed by Jodelle Red MD Signature Date/Time: 08/27/2019/2:58:02 PM    Final         Scheduled Meds: . aspirin EC  325 mg Oral Daily  . atorvastatin  80 mg Oral q1800  . clopidogrel  75 mg Oral Daily  . febuxostat  40 mg Oral Daily  . FLUoxetine  10 mg Oral Daily    . heparin  5,000 Units Subcutaneous Q8H  . insulin aspart  0-6 Units Subcutaneous Q4H  . multivitamin with minerals  1 tablet Oral Daily  . sodium chloride flush  3 mL Intravenous Once   Continuous Infusions: . sodium chloride Stopped (08/27/19 0042)  . sodium chloride 75 mL/hr at 08/29/19 0735     LOS: 2 days    Time spent:40 min    Pearlina Friedly, Roselind Messier, MD [facility] Pager [phone]  If 7PM-7AM, please contact night-coverage [email or web address] Password [facility] 08/29/2019, 11:35 AM

## 2019-08-29 NOTE — Progress Notes (Signed)
PT Cancellation Note  Patient Details Name: [name] MRN: [id number] DOB: November 11, 1950   Cancelled Treatment:    Reason Eval/Treat Not Completed: Patient at procedure or test/unavailable. Pt off the floor at IR. PT to return as able, as appropriate, to re-assess mobility.  Lewis Shock, PT, DPT Acute Rehabilitation Services Pager #: [phone] Office #: [phone]    Iona Hansen 08/29/2019, 10:16 AM

## 2019-08-29 NOTE — Progress Notes (Signed)
This chaplain responded to PMT consult for spiritual care/Advance Directive. The chaplain introduced herself to the Pt. and Pt. wife-Kay before the Pt. left for a procedure.  The chaplain will return on Tuesday for F/U spiritual care

## 2019-08-30 DIAGNOSIS — R4781 Slurred speech: Secondary | ICD-10-CM

## 2019-08-30 DIAGNOSIS — R4182 Altered mental status, unspecified: Secondary | ICD-10-CM | POA: Diagnosis present

## 2019-08-30 DIAGNOSIS — R410 Disorientation, unspecified: Secondary | ICD-10-CM | POA: Diagnosis not present

## 2019-08-30 DIAGNOSIS — N1831 Chronic kidney disease, stage 3a: Secondary | ICD-10-CM | POA: Diagnosis not present

## 2019-08-30 DIAGNOSIS — I651 Occlusion and stenosis of basilar artery: Secondary | ICD-10-CM | POA: Diagnosis not present

## 2019-08-30 DIAGNOSIS — E785 Hyperlipidemia, unspecified: Secondary | ICD-10-CM | POA: Diagnosis not present

## 2019-08-30 DIAGNOSIS — I63213 Cerebral infarction due to unspecified occlusion or stenosis of bilateral vertebral arteries: Secondary | ICD-10-CM | POA: Diagnosis not present

## 2019-08-30 LAB — COMPREHENSIVE METABOLIC PANEL
ALT: 29 U/L (ref 0–44)
AST: 30 U/L (ref 15–41)
Albumin: 3.5 g/dL (ref 3.5–5.0)
Alkaline Phosphatase: 121 U/L (ref 38–126)
Anion gap: 10 (ref 5–15)
BUN: 13 mg/dL (ref 8–23)
CO2: 25 mmol/L (ref 22–32)
Calcium: 9 mg/dL (ref 8.9–10.3)
Chloride: 104 mmol/L (ref 98–111)
Creatinine, Ser: 1.42 mg/dL — ABNORMAL HIGH (ref 0.61–1.24)
GFR calc Af Amer: 58 mL/min — ABNORMAL LOW (ref >60)
GFR calc non Af Amer: 50 mL/min — ABNORMAL LOW (ref >60)
Glucose, Bld: 126 mg/dL — ABNORMAL HIGH (ref 70–99)
Potassium: 3.9 mmol/L (ref 3.5–5.1)
Sodium: 139 mmol/L (ref 135–145)
Total Bilirubin: 0.9 mg/dL (ref 0.3–1.2)
Total Protein: 7.2 g/dL (ref 6.5–8.1)

## 2019-08-30 LAB — CBC WITH DIFFERENTIAL/PLATELET
Abs Immature Granulocytes: 0.02 10*3/uL (ref 0.00–0.07)
Basophils Absolute: 0 10*3/uL (ref 0.0–0.1)
Basophils Relative: 0 %
Eosinophils Absolute: 0.4 10*3/uL (ref 0.0–0.5)
Eosinophils Relative: 5 %
HCT: 41.4 % (ref 39.0–52.0)
Hemoglobin: 14.1 g/dL (ref 13.0–17.0)
Immature Granulocytes: 0 %
Lymphocytes Relative: 18 %
Lymphs Abs: 1.4 10*3/uL (ref 0.7–4.0)
MCH: 31.8 pg (ref 26.0–34.0)
MCHC: 34.1 g/dL (ref 30.0–36.0)
MCV: 93.5 fL (ref 80.0–100.0)
Monocytes Absolute: 0.7 10*3/uL (ref 0.1–1.0)
Monocytes Relative: 9 %
Neutro Abs: 5.5 10*3/uL (ref 1.7–7.7)
Neutrophils Relative %: 68 %
Platelets: 292 10*3/uL (ref 150–400)
RBC: 4.43 MIL/uL (ref 4.22–5.81)
RDW: 13.3 % (ref 11.5–15.5)
WBC: 8.1 10*3/uL (ref 4.0–10.5)
nRBC: 0 % (ref 0.0–0.2)

## 2019-08-30 LAB — GLUCOSE, CAPILLARY
Glucose-Capillary: 105 mg/dL — ABNORMAL HIGH (ref 70–99)
Glucose-Capillary: 131 mg/dL — ABNORMAL HIGH (ref 70–99)
Glucose-Capillary: 126 mg/dL — ABNORMAL HIGH (ref 70–99)
Glucose-Capillary: 131 mg/dL — ABNORMAL HIGH (ref 70–99)
Glucose-Capillary: 113 mg/dL — ABNORMAL HIGH (ref 70–99)
Glucose-Capillary: 112 mg/dL — ABNORMAL HIGH (ref 70–99)

## 2019-08-30 LAB — PHOSPHORUS: Phosphorus: 3 mg/dL (ref 2.5–4.6)

## 2019-08-30 LAB — MAGNESIUM: Magnesium: 2 mg/dL (ref 1.7–2.4)

## 2019-08-30 NOTE — Progress Notes (Signed)
Referring Physician(s): Marvel PlanXu, Jindong  Supervising Physician: Julieanne Cottoneveshwar, Sanjeev  [facility]  [facility]  Chief Complaint: None  Subjective:  Acute CVA s/p diagnostic cerebral arteriogram 08/29/2019 by Dr. Corliss Skainseveshwar revealing severe symptomatic basilar artery stenosis, thought to be cause of recent CVA. Patient awake and alert sitting in chair with no complaints at this time. Can spontaneously move all extremities.   Allergies: Patient has no known allergies.  Medications: Prior to Admission medications   Medication Sig Start Date End Date Taking? Authorizing Provider  amLODipine (NORVASC) 2.5 MG tablet Take 2.5 mg by mouth daily. 08/17/19  Yes [provider]  aspirin EC 81 MG tablet Take 81 mg by mouth daily. Swallow whole.   Yes [provider]  atorvastatin (LIPITOR) 80 MG tablet Take 80 mg by mouth daily at 6 PM.  08/17/19  Yes [provider]  carvedilol (COREG) 3.125 MG tablet Take 3.125 mg by mouth 2 (two) times daily. 07/07/19  Yes [provider]  clopidogrel (PLAVIX) 75 MG tablet Take 75 mg by mouth daily. 08/17/19  Yes [provider]  febuxostat (ULORIC) 40 MG tablet Take 40 mg by mouth daily. 08/17/19  Yes [provider]  FLUoxetine (PROZAC) 10 MG capsule Take 10 mg by mouth daily. 08/17/19  Yes [provider]  Misc Natural Products (TART CHERRY ADVANCED PO) Take 1,200 mg by mouth daily.   Yes [provider]  MITIGARE 0.6 MG CAPS Take 0.6-1.2 mg by mouth 2 (two) times daily as needed (gout flareup).  06/10/19  Yes [provider]  Multiple Vitamins-Minerals (ONE-A-DAY MENS 50+ PO) Take 1 tablet by mouth daily.   Yes [provider]     Vital Signs: BP (!) 164/93 (BP Location: Left Arm)   Pulse 74   Temp 97.8 F (36.6 C) (Oral)   Resp 14   Ht 5\' 10"  (1.778 m)   Wt (!) 240 lb (108.9 kg)   SpO2 96%   BMI 34.44 kg/m   Physical Exam Vitals and nursing note reviewed.    Constitutional:      General: [name] is not in acute distress.    Appearance: Normal appearance.  Cardiovascular:     Rate and Rhythm: Normal rate and regular rhythm.     Heart sounds: Normal heart sounds. No murmur heard.   Pulmonary:     Effort: Pulmonary effort is normal. No respiratory distress.     Breath sounds: Normal breath sounds. No wheezing.  Skin:    General: Skin is warm and dry.  Neurological:     Mental Status: [name] is alert.     Comments: Alert, awake, and oriented x3. Follows simple commands. PERRL bilaterally. Decreased visual field in both eyes peripherally. Can spontaneously move all extremities.     Imaging: CT ANGIO HEAD W OR WO CONTRAST  Result Date: 08/28/2019 CLINICAL DATA:  Stroke follow-up EXAM: CT ANGIOGRAPHY HEAD AND NECK TECHNIQUE: Multidetector CT imaging of the head and neck was performed using the standard protocol during bolus administration of intravenous contrast. Multiplanar CT image reconstructions and MIPs were obtained to evaluate the vascular anatomy. Carotid stenosis measurements (when applicable) are obtained utilizing NASCET criteria, using the distal internal carotid diameter as the denominator. CONTRAST:  75mL OMNIPAQUE IOHEXOL 350 MG/ML SOLN COMPARISON:  08/01/2019 FINDINGS: CT HEAD FINDINGS Brain: There is no mass, hemorrhage or extra-axial collection. There is generalized atrophy without lobar predilection. There are old bilateral PCA territory infarcts. There is hypoattenuation of the periventricular white  matter, most commonly indicating chronic ischemic microangiopathy. Skull: The visualized skull base, calvarium and extracranial soft tissues are normal. Sinuses/Orbits: Moderate maxillary and ethmoid sinus mucosal thickening. The orbits are normal. CTA NECK FINDINGS SKELETON: There is no bony spinal canal stenosis. No lytic or blastic lesion. OTHER NECK: Normal pharynx, larynx and major salivary glands. No cervical lymphadenopathy. Unremarkable  thyroid gland. UPPER CHEST: No pneumothorax or pleural effusion. No nodules or masses. AORTIC ARCH: There is mild calcific atherosclerosis of the aortic arch. There is no aneurysm, dissection or hemodynamically significant stenosis of the visualized portion of the aorta. Conventional 3 vessel aortic branching pattern. The visualized proximal subclavian arteries are widely patent. RIGHT CAROTID SYSTEM: No dissection, occlusion or aneurysm. Mild atherosclerotic calcification at the carotid bifurcation without hemodynamically significant stenosis. LEFT CAROTID SYSTEM: No dissection, occlusion or aneurysm. There is mixed density atherosclerosis extending into the proximal ICA, resulting in less than 50% stenosis. VERTEBRAL ARTERIES: Left dominant configuration. There at least moderate atherosclerotic stenosis of the right V1 segment. There is otherwise no dissection, occlusion or flow-limiting stenosis to the skull base (V1-V3 segments). CTA HEAD FINDINGS POSTERIOR CIRCULATION: --Vertebral arteries: There is severe narrowing of the distal V4 segments, unchanged --Inferior cerebellar arteries: Normal. --Basilar artery: Normal. --Superior cerebellar arteries: Normal. --Posterior cerebral arteries (PCA): Normal. ANTERIOR CIRCULATION: --Intracranial internal carotid arteries: Atherosclerotic calcification of the internal carotid arteries at the skull base with moderate bilateral stenosis. --Anterior cerebral arteries (ACA): Normal. Both A1 segments are present. Patent anterior communicating artery (a-comm). --Middle cerebral arteries (MCA): Normal. VENOUS SINUSES: As permitted by contrast timing, patent. ANATOMIC VARIANTS: None Review of the MIP images confirms the above findings. IMPRESSION: 1. No emergent large vessel occlusion. 2. 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 3. 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 4. Unchanged old bilateral PCA  territory infarcts and chronic ischemic microangiopathy. 5. Aortic Atherosclerosis (ICD10-I70.0). Electronically Signed   By: Deatra Robinson M.D.   On: 08/28/2019 20:47   CT ANGIO NECK W OR WO CONTRAST  Result Date: 08/28/2019 CLINICAL DATA:  Stroke follow-up EXAM: CT ANGIOGRAPHY HEAD AND NECK TECHNIQUE: Multidetector CT imaging of the head and neck was performed using the standard protocol during bolus administration of intravenous contrast. Multiplanar CT image reconstructions and MIPs were obtained to evaluate the vascular anatomy. Carotid stenosis measurements (when applicable) are obtained utilizing NASCET criteria, using the distal internal carotid diameter as the denominator. CONTRAST:  44mL OMNIPAQUE IOHEXOL 350 MG/ML SOLN COMPARISON:  08/01/2019 FINDINGS: CT HEAD FINDINGS Brain: There is no mass, hemorrhage or extra-axial collection. There is generalized atrophy without lobar predilection. There are old bilateral PCA territory infarcts. There is hypoattenuation of the periventricular white matter, most commonly indicating chronic ischemic microangiopathy. Skull: The visualized skull base, calvarium and extracranial soft tissues are normal. Sinuses/Orbits: Moderate maxillary and ethmoid sinus mucosal thickening. The orbits are normal. CTA NECK FINDINGS SKELETON: There is no bony spinal canal stenosis. No lytic or blastic lesion. OTHER NECK: Normal pharynx, larynx and major salivary glands. No cervical lymphadenopathy. Unremarkable thyroid gland. UPPER CHEST: No pneumothorax or pleural effusion. No nodules or masses. AORTIC ARCH: There is mild calcific atherosclerosis of the aortic arch. There is no aneurysm, dissection or hemodynamically significant stenosis of the visualized portion of the aorta. Conventional 3 vessel aortic branching pattern. The visualized proximal subclavian arteries are widely patent. RIGHT CAROTID SYSTEM: No dissection, occlusion or aneurysm. Mild atherosclerotic calcification at  the carotid bifurcation without hemodynamically significant stenosis. LEFT  CAROTID SYSTEM: No dissection, occlusion or aneurysm. There is mixed density atherosclerosis extending into the proximal ICA, resulting in less than 50% stenosis. VERTEBRAL ARTERIES: Left dominant configuration. There at least moderate atherosclerotic stenosis of the right V1 segment. There is otherwise no dissection, occlusion or flow-limiting stenosis to the skull base (V1-V3 segments). CTA HEAD FINDINGS POSTERIOR CIRCULATION: --Vertebral arteries: There is severe narrowing of the distal V4 segments, unchanged --Inferior cerebellar arteries: Normal. --Basilar artery: Normal. --Superior cerebellar arteries: Normal. --Posterior cerebral arteries (PCA): Normal. ANTERIOR CIRCULATION: --Intracranial internal carotid arteries: Atherosclerotic calcification of the internal carotid arteries at the skull base with moderate bilateral stenosis. --Anterior cerebral arteries (ACA): Normal. Both A1 segments are present. Patent anterior communicating artery (a-comm). --Middle cerebral arteries (MCA): Normal. VENOUS SINUSES: As permitted by contrast timing, patent. ANATOMIC VARIANTS: None Review of the MIP images confirms the above findings. IMPRESSION: 1. No emergent large vessel occlusion. 2. 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 3. 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 4. Unchanged old bilateral PCA territory infarcts and chronic ischemic microangiopathy. 5. Aortic Atherosclerosis (ICD10-I70.0). Electronically Signed   By: Deatra Robinson M.D.   On: 08/28/2019 20:47   MR BRAIN WO CONTRAST  Result Date: 08/26/2019 CLINICAL DATA:  Neuro deficit. EXAM: MRI HEAD WITHOUT CONTRAST TECHNIQUE: Multiplanar, multiecho pulse sequences of the brain and surrounding structures were obtained without intravenous contrast. COMPARISON:  08/26/2019 head CT.  08/31/2019 MRI head. FINDINGS:  Brain: Multifocal restricted diffusion and correlative T2 hyperintense signal involving the bilateral occipital lobes and anterior left pons. Tiny remote microhemorrhages involving the bilateral cerebellum. Moderate diffuse parenchymal volume loss. Sequela of remote bilateral occipital and cerebellar infarcts. Smaller remote infarcts are seen involving the dorsal left midbrain, bilateral centrum semiovale, bilateral thalami and left frontal white matter. No midline shift, mass lesion or extra-axial fluid collection. Vascular: FLAIR hyperintense signal within the right V4 segment may reflect slow flow versus occlusion, unchanged from prior exam. Skull and upper cervical spine: Normal marrow signal. Sinuses/Orbits: Normal orbits. Mild-to-moderate pansinus mucosal thickening with layering bilateral maxillary secretions. Trace left mastoid effusion. Other: None. IMPRESSION: Acute infarcts involving the bilateral occipital lobes and anterior left pons. Remote infarcts involving the bilateral occipital lobes, bilateral cerebellum, dorsal left midbrain, bilateral centrum semiovale, bilateral thalami and left frontal white matter. 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 These results were called by telephone at the time of interpretation on 08/26/2019 at 5:08 pm to provider Dr. Fredderick Phenix, who verbally acknowledged these results. Electronically Signed   By: Stana Bunting M.D.   On: 08/26/2019 17:11   ECHOCARDIOGRAM COMPLETE  Result Date: 08/27/2019    ECHOCARDIOGRAM REPORT   Patient Name:   [name] Date of Exam: 08/27/2019 Medical Rec #:  [id number]    Height:       70.0 in Accession #:    [id number]   Weight:       240.0 lb Date of Birth:  05/18/1950    BSA:          2.255 m Patient Age:    68 years     BP:           141/92 mmHg Patient Gender: M            HR:           71 bpm. Exam Location:  Inpatient Procedure: 2D Echo, Cardiac Doppler and Color Doppler Indications:    Stroke  434.91 /  I163.9  History:        Patient has no prior history of Echocardiogram examinations.                 Previous Myocardial Infarction; Risk Factors:Dyslipidemia.  Sonographer:    Elmarie Shiley Dance Referring Phys: [id number] AVA SWAYZE IMPRESSIONS  1. Left ventricular ejection fraction, by estimation, is 45 to 50%. The left ventricle has mildly decreased function. The left ventricle demonstrates global hypokinesis. There is mild concentric left ventricular hypertrophy. Left ventricular diastolic parameters are indeterminate.  2. Right ventricular systolic function is normal. The right ventricular size is normal.  3. The mitral valve is normal in structure. Trivial mitral valve regurgitation. No evidence of mitral stenosis.  4. The aortic valve is tricuspid. Aortic valve regurgitation is mild.  5. The inferior vena cava is normal in size with greater than 50% respiratory variability, suggesting right atrial pressure of 3 mmHg. Conclusion(s)/Recommendation(s): No intracardiac source of embolism detected on this transthoracic study. A transesophageal echocardiogram is recommended to exclude cardiac source of embolism if clinically indicated. FINDINGS  Left Ventricle: Left ventricular ejection fraction, by estimation, is 45 to 50%. The left ventricle has mildly decreased function. The left ventricle demonstrates global hypokinesis. The left ventricular internal cavity size was normal in size. There is  mild concentric left ventricular hypertrophy. Left ventricular diastolic parameters are indeterminate. Right Ventricle: The right ventricular size is normal. No increase in right ventricular wall thickness. Right ventricular systolic function is normal. Left Atrium: Left atrial size was normal in size. Right Atrium: Right atrial size was normal in size. Pericardium: Trivial pericardial effusion is present. Presence of pericardial fat pad. Mitral Valve: The mitral valve is normal in structure. Trivial mitral valve  regurgitation. No evidence of mitral valve stenosis. Tricuspid Valve: The tricuspid valve is normal in structure. Tricuspid valve regurgitation is trivial. No evidence of tricuspid stenosis. Aortic Valve: The aortic valve is tricuspid. Aortic valve regurgitation is mild. Aortic regurgitation PHT measures 745 msec. Pulmonic Valve: The pulmonic valve was not well visualized. Pulmonic valve regurgitation is not visualized. Aorta: The aortic root and ascending aorta are structurally normal, with no evidence of dilitation. Venous: The inferior vena cava is normal in size with greater than 50% respiratory variability, suggesting right atrial pressure of 3 mmHg. IAS/Shunts: No atrial level shunt detected by color flow Doppler.  LEFT VENTRICLE PLAX 2D LVIDd:         4.40 cm  Diastology LVIDs:         3.40 cm  LV e' lateral:   4.03 cm/s LV PW:         1.20 cm  LV E/e' lateral: 10.4 LV IVS:        1.40 cm  LV e' medial:    4.79 cm/s LVOT diam:     2.10 cm  LV E/e' medial:  8.8 LV SV:         50 LV SV Index:   22 LVOT Area:     3.46 cm  RIGHT VENTRICLE             IVC RV Basal diam:  2.70 cm     IVC diam: 1.40 cm RV S prime:     10.00 cm/s TAPSE (M-mode): 2.0 cm LEFT ATRIUM             Index       RIGHT ATRIUM          Index LA diam:  4.70 cm 2.08 cm/m  RA Area:     8.83 cm LA Vol (A2C):   53.5 ml 23.72 ml/m RA Volume:   12.50 ml 5.54 ml/m LA Vol (A4C):   34.9 ml 15.47 ml/m LA Biplane Vol: 43.7 ml 19.37 ml/m  AORTIC VALVE LVOT Vmax:   79.15 cm/s LVOT Vmean:  51.500 cm/s LVOT VTI:    0.146 m AI PHT:      745 msec  AORTA Ao Root diam: 3.80 cm Ao Asc diam:  3.75 cm MITRAL VALVE MV Area (PHT): 2.73 cm    SHUNTS MV Decel Time: 278 msec    Systemic VTI:  0.15 m MV E velocity: 42.10 cm/s  Systemic Diam: 2.10 cm MV A velocity: 96.80 cm/s MV E/A ratio:  0.43 Jodelle Red MD Electronically signed by Jodelle Red MD Signature Date/Time: 08/27/2019/2:58:02 PM    Final     Labs:  CBC: Recent Labs     08/26/19 1911 08/27/19 1815 08/28/19 0407 08/30/19 0606  WBC 6.9 6.2 5.8 8.1  HGB 14.4 13.1 12.8* 14.1  HCT 42.2 37.9* 37.2* 41.4  PLT 278 245 220 292    COAGS: Recent Labs    08/26/19 0658 08/29/19 1825  INR 1.0 1.1  APTT 28  --     BMP: Recent Labs    08/26/19 0658 08/26/19 0658 08/26/19 0726 08/26/19 1911 08/27/19 1815 08/28/19 0407 08/30/19 0606  NA 138   < > 142  --  138 139 139  K 3.7   < > 3.6  --  3.7 3.5 3.9  CL 103   < > 102  --  102 105 104  CO2 25  --   --   --  GLUCOSE 163*   < > 160*  --  129* 114* 126*  BUN 16   < > 18  --  CALCIUM 9.4  --   --   --  8.9 8.9 9.0  CREATININE 1.56*   < > 1.60* 1.47* 1.35* 1.44* 1.42*  GFRNONAA 45*   < >  --  48* 54* 50* 50*  GFRAA 52*   < >  --  56* >60 57* 58*   < > = values in this interval not displayed.    LIVER FUNCTION TESTS: Recent Labs    08/26/19 0658 08/27/19 1815 08/28/19 0407 08/30/19 0606  BILITOT 0.8 0.8 0.8 0.9  AST ALT ALKPHOS 106 122 112 121  PROT 7.4 6.8 6.3* 7.2  ALBUMIN 3.4* 3.2* 3.0* 3.5    Assessment and Plan:  Acute CVA s/p diagnostic cerebral arteriogram 08/29/2019 by Dr. Corliss Skains revealing severe symptomatic basilar artery stenosis, thought to be cause of recent CVA. Plan for image-guided cerebral arteriogram with possible revascularization (angioplasty/stent placement) of basilar artery stenosis tentatively for Thursday 09/01/2019 in IR with Dr. Corliss Skains. Patient will be NPO at midnight prior to procedure. Afebrile and WBCs WNL. Ok to proceed with Brilinta/Aspirin use per Dr. Corliss Skains; will hold Heparin SQ per IR protocol. INR 1.1 08/29/2019. Will obtain P2Y12 tomorrow (will send out to [facility] due to P2Y12 machine malfunction at [facility]).  Risks and benefits of cerebral arteriogram with intervention were discussed with the patient including, but not limited to bleeding, infection, vascular injury, contrast induced renal failure, stroke,  reperfusion hemorrhage, or even death. This interventional procedure involves the use of X-rays and because of the nature of the planned procedure, it  is possible that we will have prolonged use of X-ray fluoroscopy. Potential radiation risks to you include (but are not limited to) the following: - A slightly elevated risk for cancer  several years later in life. This risk is typically less than 0.5% percent. This risk is low in comparison to the normal incidence of human cancer, which is 33% for women and 50% for men according to the [employer]. - Radiation induced injury can include skin redness, resembling a rash, tissue breakdown / ulcers and hair loss (which can be temporary or permanent).  The likelihood of either of these occurring depends on the difficulty of the procedure and whether you are sensitive to radiation due to previous procedures, disease, or genetic conditions.  IF your procedure requires a prolonged use of radiation, you will be notified and given written instructions for further action.  It is your responsibility to monitor the irradiated area for the 2 weeks following the procedure and to notify your physician if you are concerned that you have suffered a radiation induced injury.   All of the patient's questions were answered, patient is agreeable to proceed. Consent signed and in chart.   Electronically Signed: Elwin Mocha, PA-C 08/30/2019, 10:05 AM   I spent a total of 35 Minutes at the the patient's bedside AND on the patient's hospital floor or unit, greater than 50% of which was counseling/coordinating care for basilar artery stenosis/revascularization.

## 2019-08-30 NOTE — Progress Notes (Signed)
PROGRESS NOTE    [name]  ZOX:[id number] DOB: [birth year] DOA: 08/26/2019 PCP: Katherine Bassetowen, Christopher, PA-C     Brief Narrative:  69 yr old WM PMHx CAD, history of stents, HLD, Posterior circulation CVA . In April of 2021 the patient was diagnosed with a CVA of the PCA territory with chronic microhemorhages, as well as thalamic, brainstem, and cerebellar stroke. 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 He had 60% of the left ICA.  He was discharged on aspirin and plavix. He was readmitted to [facility] on 07/31/2019 for another posterior circulation stroke. His wife had stopped the ASA 2 weeks prior to this presentation after being told by a home health nurse that he should not be taking both ASA and Plavix. This time he was discharged to rehab on both aspiring and plavix. He was discharged to home and was doing well. [facility] [place]. He again developed dysequilibrium, slurred speech, and difficulty walking. [facility], but refused to stay because of the wait. He came here when he got home.  In the ED the patient was found to have normal vital signs. Creatinine was 1.60, and a normal CBC.  CT head demonstrated signs of prior infarct, atrophy, and chronic microvascular ischemic changes, and bilateral maxillary sinus disease.  MRI Brain demonstrated acute infarcts of the bilateral occipital lobes and anterior left pons. There was also evidence of remote infarcts involving the bilateral occipital lobes, bilateral cerebellum, dorsal left midbrain, bilateral centrum semiovale, bilateral thalami, and left frontal white matter. 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  Neurology was called from the ED and recommended admission and repeat vessel imaging to rule out an acute thrombus and to rule out  other causes . The patient also should be admitted to monitor for worsening of symptoms.  The patient denies fevers, chills, nausea, vomiting, cough, chest pain, shortness of breath. He denies hematemesis, melena, hematochezia, or coffee ground emesis.     Subjective: 7/27 afebrile   A/O x3 (does not know where), some mild confusion.  Negative CP, negative abdominal pain.  Negative slurred speech.   Assessment & Plan: Covid vaccination; negative vaccination   Active Problems:   Stroke (HCC)   Slurred speech   Dyslipidemia   CKD (chronic kidney disease), stage III   DM II (diabetes mellitus, type II), controlled (HCC)   Cerebrovascular disease   Palliative care by specialist   Goals of care, counseling/discussion   Stroke (cerebrum) (HCC)   Altered mental status  Acute on Chronic CVA -Acute infarct involving bilateral occipital lobes and anterior LEFT pons.  Embolic?  Source unknown. -CTA head/neck pending -Echocardiogram pending -7/25 discussed case with Dr.Xu stroke team plan is for in the A.m. patient to go to lab and determine if there is vasculature in the posterior circulation which can be stented open. -7/26 cerebral arteriogram revealed severe artery stenosis; see results below -7/27 plan is for image-guided cerebral arteriogram with possible revascularization (angioplasty/stent placement) of basilar artery stenosis tentatively for Thursday 09/01/2019 in IR with Dr. Corliss Skainseveshwar.  AMS -Easily confused, but redirectable -Soft restraints PRN -We will need current bed qhs for safety  Essential HTN -Allow permissive HTN -Coreg 3.125 mg BID (hold)  HLD -Atorvastatin 80 mg daily -Lipid panel pending  DM type II uncontrolled with complication -Hemoglobin  A1c pending -Very sensitive SSI  CKD stage IIIa (baseline Cr 1.56 no previous creatinine for comparison) -Hold all nephrotoxic medication -Monitor closely Recent Labs  Lab 08/26/19 0726 08/26/19 1911  08/27/19 1815 08/28/19 0407 08/30/19 0606  CREATININE 1.60* 1.47* 1.35* 1.44* 1.42*       Goals of care -7/24; Palliative Care Consult; patient with multiple recent strokes evaluate for change of CODE STATUS to DNR. Address future medical directives   DVT prophylaxis: Subcu heparin Code Status: Full Family Communication:  Status is: Inpatient    Dispo: The patient is from: Home              Anticipated d/c is to: CIR vs SNF              Anticipated d/c date is: 7/30              Patient currently unstable      Consultants:  Stroke team IR Dr. Corliss Skains   Procedures/Significant Events:  CTA head/neck pending 7/26 cerebral angiogram;-S/P 4 vessel cerebreal arteriogram RT Rad approach./ \Findings. -Severe symptomatic stenosis of dominant Lt VBJ prox to basilar artery  -Oclluded v severe stenosis of non dominant RT VBJ marred by motion artifact. -Approx 50 % stenosis LT MCA M 1 seg.   I have personally reviewed and interpreted all radiology studies and my findings are as above.  VENTILATOR SETTINGS:    Cultures   Antimicrobials:    Devices    LINES / TUBES:      Continuous Infusions: . sodium chloride Stopped (08/27/19 0042)  . sodium chloride 50 mL/hr at 08/29/19 1730     Objective: Vitals:   08/30/19 0452 08/30/19 0453 08/30/19 0736 08/30/19 1200  BP:  (!) 159/97 (!) 164/93 (!) 162/86  Pulse:  89 74 86  Resp:  15 14 12   Temp: 97.7 F (36.5 C) 97.7 F (36.5 C) 97.8 F (36.6 C) 97.8 F (36.6 C)  TempSrc: Oral Oral Oral Oral  SpO2:  95% 96% 97%  Weight:      Height:        Intake/Output Summary (Last 24 hours) at 08/30/2019 1203 Last data filed at 08/30/2019 1000 Gross per 24 hour  Intake 240 ml  Output 1000 ml  Net -760 ml   Filed Weights   08/27/19 0313  Weight: (!) 108.9 kg   Physical Exam:  General: A/O x3 (does not know where) No acute respiratory distress Eyes: negative scleral hemorrhage, negative anisocoria, negative  icterus ENT: Negative Runny nose, negative gingival bleeding, Neck:  Negative scars, masses, torticollis, lymphadenopathy, JVD Lungs: Clear to auscultation bilaterally without wheezes or crackles Cardiovascular: Regular rate and rhythm without murmur gallop or rub normal S1 and S2 Abdomen: negative abdominal pain, nondistended, positive soft, bowel sounds, no rebound, no ascites, no appreciable mass Extremities: No significant cyanosis, clubbing, or edema bilateral lower extremities Skin: Negative rashes, lesions, ulcers Psychiatric:  Negative depression, negative anxiety, negative fatigue, negative mania  Central nervous system:  Cranial nerves II through XII intact, tongue/uvula midline, LEFT extremities muscle strength 5/5, RIGHT hemiparesis, sensation intact throughout, finger nose finger bilateral NOTt within normal limits, quick finger touch bilateral within normal limits, negative dysarthria, negative expressive aphasia, negative receptive aphasia.  Bitemporal hemianopsia   .     Data Reviewed: Care during the described time interval was provided by me .  I have reviewed this patient's available data, including medical history, events of note, physical examination, and all test results as part of my evaluation.  CBC: Recent Labs  Lab 08/26/19 0658 08/26/19 0658 08/26/19 0726 08/26/19 1911 08/27/19 1815 08/28/19 0407 08/30/19 0606  WBC 7.9  --   --  6.9 6.2 5.8 8.1  NEUTROABS 5.4  --   --   --  3.8 3.3 5.5  HGB 14.0   < > 13.6 14.4 13.1 12.8* 14.1  HCT 40.9   < > 40.0 42.2 37.9* 37.2* 41.4  MCV 95.1  --   --  94.8 92.4 92.3 93.5  PLT 281  --   --  278 245 220 292   < > = values in this interval not displayed.   Basic Metabolic Panel: Recent Labs  Lab 08/26/19 0658 08/26/19 0658 08/26/19 0726 08/26/19 1911 08/27/19 1815 08/28/19 0407 08/30/19 0606  NA 138  --  142  --  138 139 139  K 3.7  --  3.6  --  3.7 3.5 3.9  CL 103  --  102  --  102 105 104  CO2 25  --   --    --  25 25 25   GLUCOSE 163*  --  160*  --  129* 114* 126*  BUN 16  --  18  --  15 14 13   CREATININE 1.56*   < > 1.60* 1.47* 1.35* 1.44* 1.42*  CALCIUM 9.4  --   --   --  8.9 8.9 9.0  MG  --   --   --   --  1.8 2.0 2.0  PHOS  --   --   --   --  3.6 3.5 3.0   < > = values in this interval not displayed.   GFR: Estimated Creatinine Clearance: 61.5 mL/min (A) (by C-G formula based on SCr of 1.42 mg/dL (H)). Liver Function Tests: Recent Labs  Lab 08/26/19 0658 08/27/19 1815 08/28/19 0407 08/30/19 0606  AST 23 24 21 30   ALT 22 23 19 29   ALKPHOS 106 122 112 121  BILITOT 0.8 0.8 0.8 0.9  PROT 7.4 6.8 6.3* 7.2  ALBUMIN 3.4* 3.2* 3.0* 3.5   No results for input(s): LIPASE, AMYLASE in the last 168 hours. No results for input(s): AMMONIA in the last 168 hours. Coagulation Profile: Recent Labs  Lab 08/26/19 0658 08/29/19 1825  INR 1.0 1.1   Cardiac Enzymes: No results for input(s): CKTOTAL, CKMB, CKMBINDEX, TROPONINI in the last 168 hours. BNP (last 3 results) No results for input(s): PROBNP in the last 8760 hours. HbA1C: Recent Labs    08/27/19 1815  HGBA1C 6.2*   CBG: Recent Labs  Lab 08/29/19 2025 08/29/19 2310 08/30/19 0446 08/30/19 0733 08/30/19 1158  GLUCAP 131* 108* 105* 131* 126*   Lipid Profile: Recent Labs    08/27/19 1815  CHOL 96  HDL 22*  LDLCALC 55  TRIG 96  CHOLHDL 4.4   Thyroid Function Tests: No results for input(s): TSH, T4TOTAL, FREET4, T3FREE, THYROIDAB in the last 72 hours. Anemia Panel: No results for input(s): VITAMINB12, FOLATE, FERRITIN, TIBC, IRON, RETICCTPCT in the last 72 hours. Sepsis Labs: No results for input(s): PROCALCITON, LATICACIDVEN in the last 168 hours.  Recent Results (from the past 240 hour(s))  SARS Coronavirus 2 by RT PCR (hospital order, performed in [facility] hospital lab) Nasopharyngeal Nasopharyngeal Swab     Status: None   Collection Time: 08/26/19  3:00 PM   Specimen: Nasopharyngeal Swab  Result Value  Ref Range Status   SARS Coronavirus 2 NEGATIVE NEGATIVE Final    Comment: (NOTE) SARS-CoV-2 target nucleic  acids are NOT DETECTED.  The SARS-CoV-2 RNA is generally detectable in upper and lower respiratory specimens during the acute phase of infection. The lowest concentration of SARS-CoV-2 viral copies this assay can detect is 250 copies / mL. A negative result does not preclude SARS-CoV-2 infection and should not be used as the sole basis for treatment or other patient management decisions.  A negative result may occur with improper specimen collection / handling, submission of specimen other than nasopharyngeal swab, presence of viral mutation(s) within the areas targeted by this assay, and inadequate number of viral copies (<250 copies / mL). A negative result must be combined with clinical observations, patient history, and epidemiological information.  Fact Sheet for Patients:   BoilerBrush.com.cy  Fact Sheet for Healthcare Providers: https://pope.com/  This test is not yet approved or  cleared by the [place] FDA and has been authorized for detection and/or diagnosis of SARS-CoV-2 by FDA under an Emergency Use Authorization (EUA).  This EUA will remain in effect (meaning this test can be used) for the duration of the COVID-19 declaration under Section 564(b)(1) of the Act, 21 U.S.C. section 360bbb-3(b)(1), unless the authorization is terminated or revoked sooner.  Performed at [facility], [address] [place], [place] [address]          Radiology Studies: No results found.      Scheduled Meds: . aspirin EC  81 mg Oral Daily  . atorvastatin  80 mg Oral q1800  . febuxostat  40 mg Oral Daily  . FLUoxetine  10 mg Oral Daily  . heparin  5,000 Units Subcutaneous Q8H  . insulin aspart  0-6 Units Subcutaneous Q4H  . multivitamin with minerals  1 tablet Oral Daily  . sodium chloride flush  3 mL  Intravenous Once  . ticagrelor  90 mg Oral BID   Continuous Infusions: . sodium chloride Stopped (08/27/19 0042)  . sodium chloride 50 mL/hr at 08/29/19 1730     LOS: 3 days    Time spent:40 min    Cataleyah Colborn, Roselind Messier, MD [facility] Pager [phone]  If 7PM-7AM, please contact night-coverage [email or web address] Password [facility] 08/30/2019, 12:03 PM

## 2019-08-30 NOTE — Progress Notes (Signed)
   08/30/19 1109  Clinical Encounter Type  Visited With Patient  Visit Type Follow-up;Spiritual support  Referral From Palliative care team  Consult/Referral To Chaplain  This chaplain was present with Pt. for F/U spiritual care visit.  The chaplain learned from the Pt., the Pt. wife-Kay is sick at home today. The Pt. updated the chaplain on the results of yesterday's test and the plans for the Thursday morning procedure.  The Pt. is hopeful the procedure will end the reoccurring strokes.  The Pt. trusts [name] with all medical decisions, if the Pt. could not make them himself. The Pt. describes their 55 year marriage as a relationship that is a reflective of each others strengths and weaknesses. The Pt. easily talks about his profession, time away from [facility], and his family.  The chaplain will F/U with the Pt. wife- [name] when she returns to the hospital.

## 2019-08-30 NOTE — Progress Notes (Signed)
Physical Therapy Treatment Patient Details Name: [name] MRN: [id number] DOB: 08/10/1950 Today's Date: 08/30/2019    History of Present Illness Pt is a 69 y.o. male presenting 08/26/19 with onset of dysequilibrium, slurred speech and difficulty walking while in [facility], went to ED but too long of a wait so came to [facility] when they came back. 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 S/p diagnostic cerebral arteriogram 7/26 revealing severe symptomatic basilar artery stenosis, thought to be cause of recent CVA. Plan for vertebro-basilar junction stenting 7/29.   PT Comments    Pt progressing with mobility. Today's session focused on gait and balance training. Pt remains limited by cognitive impairments (including impaired problem solving) and poor balance strategies/postural reactions; at high risk for falls. Pt agreeable to participate. Continue to recommend HHPT services with 24/7 supervision from family.  Resting HR 90, BP 150/108 HR up to 125 with ambulation, post-ambulation BP 164/99    Follow Up Recommendations  Home health PT;Supervision/Assistance - 24 hour     Equipment Recommendations  None recommended by PT    Recommendations for Other Services       Precautions / Restrictions Precautions Precautions: Fall;Other (comment) Precaution Comments: BP goal 130-160 per orders Restrictions Weight Bearing Restrictions: No    Mobility  Bed Mobility               General bed mobility comments: Received sitting in recliner  Transfers Overall transfer level: Needs assistance Equipment used: None Transfers: Sit to/from Stand Sit to Stand: Min assist;Min guard         General transfer comment: Performed multiple sit<>stands from recliner, heavy reliance on UE support to push into standing. Initial minA for stability, progressing to min  guard  Ambulation/Gait Ambulation/Gait assistance: Min assist;Min guard Gait Distance (Feet): 180 Feet Assistive device: None Gait Pattern/deviations: Step-through pattern;Decreased stride length;Staggering right Gait velocity: Decreased   General Gait Details: Slow, mildly unsteady gait without DME, pt intermittently reaching for UE support to correct LOB requiring minA to prevent fall   Stairs             Wheelchair Mobility    Modified Rankin (Stroke Patients Only) Modified Rankin (Stroke Patients Only) Pre-Morbid Rankin Score: Slight disability Modified Rankin: Moderately severe disability     Balance Overall balance assessment: Needs assistance Sitting-balance support: Feet supported;No upper extremity supported Sitting balance-Leahy Scale: Good Sitting balance - Comments: Able to reach bilateral feet sitting without back support   Standing balance support: During functional activity;No upper extremity supported Standing balance-Leahy Scale: Fair Standing balance comment: Can maintain static standing and accept light challenge without LOB, min guard for safety. Dynamic stability improved with UE support             High level balance activites: Turns;Sudden stops;Head turns High Level Balance Comments: Pt tends to stop and reach outside of BOS for item (i.e. grabbing face mask from table, looking for room # on wall) resulting in LOB; educ to walk closer instead of leaning. (+) LOB with turning            Cognition Arousal/Alertness: Awake/alert Behavior During Therapy: Flat affect Overall Cognitive Status: No family/caregiver present to determine baseline cognitive functioning Area of Impairment: Orientation;Attention;Memory;Following commands;Safety/judgement;Awareness;Problem solving                 Orientation Level: Disoriented to;Place;Time Current Attention Level: Sustained;Selective Memory:  Decreased short-term memory Following Commands:  Follows one step commands with increased time;Follows multi-step commands inconsistently Safety/Judgement: Decreased awareness of safety;Decreased awareness of deficits Awareness: Intellectual;Emergent Problem Solving: Slow processing;Requires verbal cues General Comments: Stating he's in [facility] and could not recall name of hospital in [place]. Able to get to correct month with cues. RN reports pt walking hallways himself earlier, pt reports "she must have the wrong patient, I haven't done anything today"      Exercises Other Exercises Other Exercises: Standing balance tasks (varying stance, eyes open/closed, external perturbations, reaching)    General Comments General comments (skin integrity, edema, etc.): HR 90-125. Seated BP 150/108, post-ambulation BP 164/99      Pertinent Vitals/Pain Pain Assessment: No/denies pain Faces Pain Scale: No hurt    Home Living                      Prior Function            PT Goals (current goals can now be found in the care plan section) Acute Rehab PT Goals Patient Stated Goal: "I want my wife to visit" Progress towards PT goals: Progressing toward goals    Frequency    Min 4X/week      PT Plan Current plan remains appropriate    Co-evaluation              AM-PAC PT "6 Clicks" Mobility   Outcome Measure  Help needed turning from your back to your side while in a flat bed without using bedrails?: None Help needed moving from lying on your back to sitting on the side of a flat bed without using bedrails?: None Help needed moving to and from a bed to a chair (including a wheelchair)?: A Little Help needed standing up from a chair using your arms (e.g., wheelchair or bedside chair)?: A Little Help needed to walk in hospital room?: A Little Help needed climbing 3-5 steps with a railing? : A Lot 6 Click Score: 19    End of Session Equipment Utilized During Treatment: Gait belt Activity Tolerance: Patient  tolerated treatment well Patient left: in chair;with call bell/phone within reach;with chair alarm set Nurse Communication: Mobility status PT Visit Diagnosis: Muscle weakness (generalized) (M62.81);Difficulty in walking, not elsewhere classified (R26.2)     Time: 0272-5366 PT Time Calculation (min) (ACUTE ONLY): 27 min  Charges:  $Gait Training: 8-22 mins $Therapeutic Exercise: 8-22 mins                     Ina Homes, PT, DPT Acute Rehabilitation Services  Pager [phone] Office [phone]  Malachy Chamber 08/30/2019, 2:59 PM

## 2019-08-30 NOTE — Progress Notes (Signed)
STROKE TEAM PROGRESS NOTE   INTERVAL HISTORY Pt sitting in chair, Chaplin is at bedside. No acute event overnight. BP stable. plan VBJ stenting Thursday.    OBJECTIVE Vitals:   08/29/19 2304 08/30/19 0452 08/30/19 0453 08/30/19 0736  BP: (!) 138/94  (!) 159/97 (!) 164/93  Pulse: 82  89 74  Resp: 16  15 14   Temp: (!) 97.5 F (36.4 C) 97.7 F (36.5 C) 97.7 F (36.5 C) 97.8 F (36.6 C)  TempSrc: Oral Oral Oral Oral  SpO2: 95%  95% 96%  Weight:      Height:       CBC:  Recent Labs  Lab 08/28/19 0407 08/30/19 0606  WBC 5.8 8.1  NEUTROABS 3.3 5.5  HGB 12.8* 14.1  HCT 37.2* 41.4  MCV 92.3 93.5  PLT 220 292   Basic Metabolic Panel:  Recent Labs  Lab 08/28/19 0407 08/30/19 0606  NA 139 139  K 3.5 3.9  CL 105 104  CO2 25 25  GLUCOSE 114* 126*  BUN 14 13  CREATININE 1.44* 1.42*  CALCIUM 8.9 9.0  MG 2.0 2.0  PHOS 3.5 3.0   Lipid Panel:     Component Value Date/Time   CHOL 96 08/27/2019 1815   TRIG 96 08/27/2019 1815   HDL 22 (L) 08/27/2019 1815   CHOLHDL 4.4 08/27/2019 1815   VLDL 19 08/27/2019 1815   LDLCALC 55 08/27/2019 1815   HgbA1c:  Lab Results  Component Value Date   HGBA1C 6.2 (H) 08/27/2019   Urine Drug Screen: No results found for: LABOPIA, COCAINSCRNUR, LABBENZ, AMPHETMU, THCU, LABBARB  Alcohol Level No results found for: [facility]  IMAGING  CT HEAD WO CONTRAST 08/26/2019 1. No acute intracranial abnormality. Signs of prior infarct, atrophy and chronic microvascular ischemic change.  2. Infarcts exhibited recent MRI not well seen on today's study with evidence of remote infarct in the bilateral occipital poles but without signs of acute intracranial process.  3. Bilateral maxillary sinus disease, new since previous imaging worse on the RIGHT.    MR BRAIN WO CONTRAST 08/26/2019 Acute infarcts involving the bilateral occipital lobes and anterior left pons. Remote infarcts involving the bilateral occipital lobes, bilateral cerebellum, dorsal left  midbrain, bilateral centrum semiovale, bilateral thalami and left frontal white matter. 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 2.Oclluded v severe stenosis of non dominant RT VBJ marred by motion artifact. 3.Approx 50 % stenosis LT MCA M 1 seg.  ECHOCARDIOGRAM COMPLETE 08/27/2019 1. Left ventricular ejection fraction, by estimation, is 45 to 50%. The left ventricle has mildly decreased function. The left ventricle demonstrates global hypokinesis. There is mild concentric left ventricular hypertrophy. Left ventricular diastolic parameters are indeterminate.  2. Right ventricular systolic function is normal. The right ventricular size is normal.  3. The mitral valve is normal in structure. Trivial mitral valve regurgitation. No evidence of mitral stenosis.  4. The aortic valve is tricuspid. Aortic valve regurgitation is mild.  5. The inferior vena cava is normal in size with greater than 50% respiratory variability, suggesting right atrial pressure of 3 mmHg. Conclusion(s)/Recommendation(s): No intracardiac source of embolism detected on this transthoracic study.  ECG - SR rate 83 BPM. (See cardiology reading for complete details)   PHYSICAL EXAM  Temp:  [97.5 F (36.4 C)-97.9 F (36.6 C)] 97.8 F (36.6 C) (07/27 0736) Pulse Rate:  [57-89] 74 (  07/27 0736) Resp:  [10-21] 14 (07/27 0736) BP: (121-164)/(76-97) 164/93 (07/27 0736) SpO2:  [95 %-98 %] 96 % (07/27 0736)  General - Well nourished, well developed, in no apparent distress.  Ophthalmologic - fundi not visualized due to noncooperation.  Cardiovascular - Regular rhythm and rate.  Neuro - awake, alert, orientated to time place and people.  No aphasia, however moderate dysarthria.  Follows simple commands, able to name and repeat.  Visual field grossly full with finger  moving test but not counting fingers, decreased visual acuity bilaterally.  Extraocular movement grossly intact, denies diplopia.  Right nasolabial fold flattening, tongue midline.  Right upper extremity 4/5 with pronator drift.  Right upper extremity and bilateral lower extremity 5/5.  Bilateral finger-to-nose intact, however bilateral heel-to-shin mild dysmetria.  Sensation symmetrical.  Gait not tested.   ASSESSMENT/PLAN [name] is a 69 y.o. male with history of HLD (hyperlipidemia), MI (myocardial infarction) (HCC), and prior Strokes (HCC) who presents with dysarthria.  He did not receive IV t-PA.  Strokes: Acute infarcts involving the bilateral occipital lobes and anterior left pons - large vessel disease due to VA stenosis/occlusion  CT head -  No acute intracranial abnormality  MRI head - Acute infarcts involving the bilateral occipital lobes and anterior left pons. Remote infarcts involving the bilateral occipital lobes, bilateral cerebellum, dorsal left midbrain, bilateral centrum semiovale, bilateral thalami and left frontal white matter. 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   CTA head and neck - right V4 occluded. Left VBJ high grade stenosis, BA patent  Cerebral angio - Left VBJ 95% stenosis. Right VA distal occlusion.  2D Echo - EF 45-50%  Sars Corona Virus 2 - negative  LDL - 55  HgbA1c 6.2  VTE prophylaxis -  Heparin  aspirin 81 mg daily and clopidogrel 75 mg daily prior to admission, now on aspirin 81 mg daily and Brilinta (ticagrelor) 90 mg bid in preparation for stenting  Patient counseled to be compliant with his antithrombotic medications  Ongoing aggressive stroke risk factor management  Therapy recommendations:  HH PT, HH OT, HH SLP  Disposition:  Pending  Recurrent posterior infarcts  05/2019 MRI bilateral occipital PCA infarcts, CTA head and neck positive for Severe stenosis of the bilateral VBJ, and moderate  to severe stenoses of both proximal Vertebral Arteries.  60% stenosis Left ICA bulb due to soft plaque. Moderate bilateral. ICA siphon stenosis due to bulky plaque. Mild left MCA M1 stenosis. Mild to moderate ACA A2 stenoses.  07/2019 Multiple acute infarcts of the cerebellumand occipital lobes  bilaterally as well as the left thalamus and posterior left mid brain. CTA H&N- June 2021- severe bilateral vertebrobasilar junction stenosis and advanced atherosclerotic changes elsewhere in the head and neck including moderate to severe bilateral vertebral artery origin stenosis, 60% stenosis left ICA bulb.  cerebral angio Left VBJ 95% stenosis. Right VA distal occlusion.  Plan for VBJ stenting on Thursday  Hypertension  Home BP meds: Norvasc ; Coreg  Current BP meds: None  Stable at 140-160 . Permissive hypertension (OK if < 220/120) but gradually normalize in 5-7 days  . Long-term BP goal 130-160 give posterior stenosis  Hyperlipidemia  Home Lipid lowering medication: Lipitor 80 mg daily  LDL - 55 goal < 70  Current lipid lowering medication: Lipitor 80 mg daily   Continue statin at discharge  DM type II, controlled  HgbA1c 6.2  No home meds  Very sensitive SSI  Other Stroke Risk Factors  Advanced age  Previous ETOH use.  Obesity, Body mass index is 34.44 kg/m., recommend weight loss, diet and exercise as appropriate   Coronary artery disease  Other Active Problems  Code status - Full code  CKD - stage 3a - creatinine - 1.56->1.60->1.47->1.35->1.44->1.42  Palliative Care Consult - 08/28/19 - Full code. Wife reviewing literature. OP f/u.   Hospital day # 3  Marvel Plan, MD PhD Stroke Neurology 08/30/2019 10:37 AM   To contact Stroke Continuity provider, please refer to [email or web address]. After hours, contact General Neurology

## 2019-08-30 NOTE — Progress Notes (Signed)
  Speech Language Pathology Treatment: Dysphagia;Cognitive-Linquistic  Patient Details Name: [name] MRN: [id number] DOB: 1950-07-13 Today's Date: 08/30/2019 Time: 9937-1696 SLP Time Calculation (min) (ACUTE ONLY): 11 min  Assessment / Plan / Recommendation Clinical Impression  Pt was seen for cognitive and swallowing tx. Min-Mod cues were provided for simple verbal problem solving. He needed encouragement to attempt POs, with throat clearing noted with thin liquids. Coughing was also noted when challenged with a faster rate and with mixed consistencies. Given more persistent signs of dysphagia and his pontine infarct, recommend proceeding with MBS. He can continue on current diet until this can be scheduled given that he seems to be relatively tolerating this diet (afebrile, lung sounds unchanged) and the intermittent nature of his symptoms.    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 Pt was admitted back in April with CVA of PCA territory as well and then again on 6/27. Pt began to experience dysequilibrium, slurred speech and difficulty walking while in [facility], went to ED but too long of a wait so came to [facility] when they came back.       SLP Plan  MBS       Recommendations  Diet recommendations: Regular;Thin liquid Liquids provided via: Cup;Straw Medication Administration: Whole meds with puree Supervision: Patient able to self feed;Intermittent supervision to cue for compensatory strategies Compensations: Minimize environmental distractions;Slow rate;Small sips/bites Postural Changes and/or Swallow Maneuvers: Seated upright 90 degrees                Oral Care Recommendations: Oral care BID Follow up Recommendations: Home health SLP;Outpatient SLP SLP Visit Diagnosis: Dysphagia, unspecified (R13.10) Plan: MBS       GO                Mahala Menghini., M.A. CCC-SLP Acute Rehabilitation  Services Pager [phone] Office [phone]  08/30/2019, 2:24 PM

## 2019-08-30 NOTE — Progress Notes (Signed)
Occupational Therapy Treatment Patient Details Name: [name] MRN: [id number] DOB: 1950-09-20 Today's Date: 08/30/2019    History of present illness Pt is a 69 y.o. male presenting 08/26/19 with onset of dysequilibrium, slurred speech and difficulty walking while in [facility], went to ED but too long of a wait so came to [facility] when they came back. 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 S/p diagnostic cerebral arteriogram 7/26 revealing severe symptomatic basilar artery stenosis, thought to be cause of recent CVA. Plan for vertebro-basilar junction stenting 7/29.   OT comments  Wife called to clarify vision history. Wife and pt state that pt has had difficulty with his vision since his stroke in April. Pt states "it's even worse now". ADL and functional mobility is significantly impacted by visual deficits, requiring min A HHA with mobility and ADL. Pt states "It's so frustrating, I can't do anything for myself". Wife called and given the number for [facility] to further assess pt for intervention after DC. Began educating pt on compensatory strategies for low vision. Call bell adapted to increase pt's ability to "feel" nurse button. Pt will need to be set up for meals and told where items are on his tray using the "clock" method. Items will need to be kept in consistent places to reduce frustration with self care. Pt will need to follow up with HHOT. Will continue to follow acutely.  Follow Up Recommendations  Home health OT;Supervision/Assistance - 24 hour    Equipment Recommendations  None recommended by OT    Recommendations for Other Services      Precautions / Restrictions Precautions Precautions: Fall;Other (comment) Precaution Comments: BP goal 130-160 per orders Restrictions Weight Bearing Restrictions: No       Mobility Bed Mobility                General bed mobility comments:  sitting in recliner  Transfers Overall transfer level: Needs assistance Equipment used: None Transfers: Sit to/from Stand Sit to Stand: Min assist         General transfer comment: HHA provided as guiding assistance due to visual impairments    Balance Overall balance assessment: Needs assistance Sitting-balance support: Feet supported;No upper extremity supported Sitting balance-Leahy Scale: Good Sitting balance - Comments: Able to reach bilateral feet sitting without back support   Standing balance support: During functional activity;No upper extremity supported Standing balance-Leahy Scale: Fair Standing balance comment: noted posterior sway; unsteady at times due to vision loss                        ADL either performed or assessed with clinical judgement   ADL Overall ADL's : Needs assistance/impaired     Grooming: Minimal assistance;Standing Grooming Details (indicate cue type and reason): Pt required guiding to sink; encouraged tpt to use sense of touch to find objects; Using "clock" strategy to orient pt to sink and find objects at sink.                 Toilet Transfer: Minimal Dentist Details (indicate cue type and reason): HHA to guide to toilet. LOB with physical assist to correct Toileting- Clothing Manipulation and Hygiene: Min guard;Sit to/from stand Toileting - Clothing Manipulation Details (indicate cue type and reason): Pt frustrated at not being able to get up adn go to the bathroom when needed "  I sat for at least 10 minutes waiting on someone adn almost peed in my pants". Nsg had obtained a urinal for pt however pt unable to locate in room. Urinal placed in bedside drawer , repeatedly retrieveing urinal to increase awareness of location. Rim of urinal marked with black sharpie to increase contrast.      Functional mobility during ADLs: Minimal assistance General ADL Comments:  focus of session on compensatory strateiges for low vision, using sense of touch to feel barriers and help identify pathway when walking to bathroom for ADL     Vision   Vision Assessment?: Yes Eye Alignment: Impaired (comment) Tracking/Visual Pursuits: Decreased smoothness of horizontal tracking;Decreased smoothness of vertical tracking Saccades: Decreased speed of saccadic movement;Impaired - to be further tested in functional context Visual Fields: Right visual field deficit;Left visual field deficit Depth Perception: Overshoots;Undershoots Additional Comments: unable to maintain visual attention on target. Unable to read largest font on Menu; Unable to touch target in field; contrast and increased lighting appear to help pt.    Perception     Praxis      Cognition Arousal/Alertness: Awake/alert Behavior During Therapy: Flat affect Overall Cognitive Status: No family/caregiver present to determine baseline cognitive functioning Area of Impairment: Orientation;Attention;Memory;Following commands;Safety/judgement;Awareness;Problem solving                 Orientation Level: Disoriented to;Time Current Attention Level: Sustained Memory: Decreased recall of precautions;Decreased short-term memory Following Commands: Follows one step commands consistently Safety/Judgement: Decreased awareness of safety;Decreased awareness of deficits Awareness: Emergent Problem Solving: Slow processing;Difficulty sequencing General Comments: significantly affected by visual deficits; will further assess        Exercises Other Exercises    Shoulder Instructions       General Comments call bell adapted to help pt feel nsg button with electrode    Pertinent Vitals/ Pain       Pain Assessment: No/denies pain Faces Pain Scale: No hurt  Home Living                                          Prior Functioning/Environment              Frequency  Min 2X/week         Progress Toward Goals  OT Goals(current goals can now be found in the care plan section)  Progress towards OT goals: Progressing toward goals  Acute Rehab OT Goals Patient Stated Goal: to be able to do more for himself OT Goal Formulation: With patient Time For Goal Achievement: 09/10/19 Potential to Achieve Goals: Good ADL Goals Pt Will Perform Grooming: with supervision Pt Will Perform Upper Body Bathing: with supervision;sitting Pt Will Perform Lower Body Bathing: with supervision;sit to/from stand Pt Will Perform Upper Body Dressing: with supervision;sitting Pt Will Perform Lower Body Dressing: with supervision;sit to/from stand Pt Will Transfer to Toilet: with supervision;ambulating;regular height toilet;grab bars Pt Will Perform Toileting - Clothing Manipulation and hygiene: with supervision;sit to/from stand Additional ADL Goal #1: Pt will verbalize 3 strategies to use to compensate for low vision with min vc  Plan Discharge plan remains appropriate    Co-evaluation                 AM-PAC OT "6 Clicks" Daily Activity     Outcome Measure   Help from another person eating meals?: A Little Help from another person taking care of  personal grooming?: A Little Help from another person toileting, which includes using toliet, bedpan, or urinal?: A Little Help from another person bathing (including washing, rinsing, drying)?: A Little Help from another person to put on and taking off regular upper body clothing?: A Little Help from another person to put on and taking off regular lower body clothing?: A Little 6 Click Score: 18    End of Session Equipment Utilized During Treatment: Gait belt  OT Visit Diagnosis: Unsteadiness on feet (R26.81);Cognitive communication deficit (R41.841);Low vision, both eyes (H54.2) Symptoms and signs involving cognitive functions: Cerebral infarction   Activity Tolerance Patient tolerated treatment well   Patient Left in  chair;with call bell/phone within reach;with chair alarm set;with nursing/sitter in room   Nurse Communication Mobility status;Other (comment) (compensatory strategies for vision)        Time: 5643-3295 OT Time Calculation (min): 25 min  Charges: OT General Charges $OT Visit: 1 Visit OT Treatments $Self Care/Home Management : 23-37 mins  Luisa Dago, OT/L   Acute OT Clinical Specialist Acute Rehabilitation Services Pager [phone] Office [phone]    [facility] 08/30/2019, 5:33 PM

## 2019-08-30 NOTE — Progress Notes (Signed)
Patient pulled IV out and leads off Dr. Joseph Art was notified and gave orders for soft restrains and mitts.

## 2019-08-31 ENCOUNTER — Inpatient Hospital Stay (HOSPITAL_COMMUNITY): Payer: Medicare HMO

## 2019-08-31 DIAGNOSIS — I6322 Cerebral infarction due to unspecified occlusion or stenosis of basilar arteries: Secondary | ICD-10-CM

## 2019-08-31 DIAGNOSIS — R410 Disorientation, unspecified: Secondary | ICD-10-CM | POA: Diagnosis not present

## 2019-08-31 DIAGNOSIS — E785 Hyperlipidemia, unspecified: Secondary | ICD-10-CM | POA: Diagnosis not present

## 2019-08-31 DIAGNOSIS — N1831 Chronic kidney disease, stage 3a: Secondary | ICD-10-CM | POA: Diagnosis not present

## 2019-08-31 DIAGNOSIS — Z7189 Other specified counseling: Secondary | ICD-10-CM | POA: Diagnosis not present

## 2019-08-31 LAB — PLATELET INHIBITION P2Y12: Platelet Function  P2Y12: 39 [PRU] — ABNORMAL LOW (ref 182–335)

## 2019-08-31 LAB — CBC WITH DIFFERENTIAL/PLATELET
Abs Immature Granulocytes: 0.01 10*3/uL (ref 0.00–0.07)
Basophils Absolute: 0 10*3/uL (ref 0.0–0.1)
Basophils Relative: 0 %
Eosinophils Absolute: 0.3 10*3/uL (ref 0.0–0.5)
Eosinophils Relative: 4 %
HCT: 41.2 % (ref 39.0–52.0)
Hemoglobin: 14.3 g/dL (ref 13.0–17.0)
Immature Granulocytes: 0 %
Lymphocytes Relative: 15 %
Lymphs Abs: 1.1 10*3/uL (ref 0.7–4.0)
MCH: 32.3 pg (ref 26.0–34.0)
MCHC: 34.7 g/dL (ref 30.0–36.0)
MCV: 93 fL (ref 80.0–100.0)
Monocytes Absolute: 0.5 10*3/uL (ref 0.1–1.0)
Monocytes Relative: 6 %
Neutro Abs: 5.3 10*3/uL (ref 1.7–7.7)
Neutrophils Relative %: 75 %
Platelets: 322 10*3/uL (ref 150–400)
RBC: 4.43 MIL/uL (ref 4.22–5.81)
RDW: 13.2 % (ref 11.5–15.5)
WBC: 7.2 10*3/uL (ref 4.0–10.5)
nRBC: 0 % (ref 0.0–0.2)

## 2019-08-31 LAB — COMPREHENSIVE METABOLIC PANEL
ALT: 31 U/L (ref 0–44)
AST: 31 U/L (ref 15–41)
Albumin: 3.5 g/dL (ref 3.5–5.0)
Alkaline Phosphatase: 134 U/L — ABNORMAL HIGH (ref 38–126)
Anion gap: 9 (ref 5–15)
BUN: 15 mg/dL (ref 8–23)
CO2: 26 mmol/L (ref 22–32)
Calcium: 9.4 mg/dL (ref 8.9–10.3)
Chloride: 102 mmol/L (ref 98–111)
Creatinine, Ser: 1.54 mg/dL — ABNORMAL HIGH (ref 0.61–1.24)
GFR calc Af Amer: 53 mL/min — ABNORMAL LOW (ref >60)
GFR calc non Af Amer: 46 mL/min — ABNORMAL LOW (ref >60)
Glucose, Bld: 192 mg/dL — ABNORMAL HIGH (ref 70–99)
Potassium: 3.6 mmol/L (ref 3.5–5.1)
Sodium: 137 mmol/L (ref 135–145)
Total Bilirubin: 1.4 mg/dL — ABNORMAL HIGH (ref 0.3–1.2)
Total Protein: 7.3 g/dL (ref 6.5–8.1)

## 2019-08-31 LAB — GLUCOSE, CAPILLARY
Glucose-Capillary: 119 mg/dL — ABNORMAL HIGH (ref 70–99)
Glucose-Capillary: 113 mg/dL — ABNORMAL HIGH (ref 70–99)
Glucose-Capillary: 130 mg/dL — ABNORMAL HIGH (ref 70–99)
Glucose-Capillary: 121 mg/dL — ABNORMAL HIGH (ref 70–99)
Glucose-Capillary: 168 mg/dL — ABNORMAL HIGH (ref 70–99)

## 2019-08-31 LAB — MAGNESIUM: Magnesium: 2 mg/dL (ref 1.7–2.4)

## 2019-08-31 LAB — PHOSPHORUS: Phosphorus: 2.8 mg/dL (ref 2.5–4.6)

## 2019-08-31 NOTE — Progress Notes (Signed)
PROGRESS NOTE    [name]  AYT:[id number] DOB: 12-21-1950 DOA: 08/26/2019 PCP: Katherine Basset, PA-C    Brief Narrative:  Patient admitted to the hospital with the working diagnosis of acute infarcts involving the bilateral occipital lobes and anterior left pons, large vessel disease due to VA stenosis/occlusion.  69 year old male with significant past medical history for coronary disease, dyslipidemia and prior CVA who presented with slurred speech, dizziness and difficulty walking.  Recent hospitalization April and June 2021 for acute CVA.  Apparently he recovered well but had recurrent symptoms that prompted him to come back to the hospital.  On his initial physical examination blood pressure 148/86, heart rate 69, temperature 99.4, respiratory rate 14, oxygen saturation 100%.  Patient was awake and alert, his strength was preserved, his lungs were clear to auscultation bilaterally, heart S1-S2, present rhythmic, soft abdomen, no lower extremity edema. Brain MRI showed acute infarcts involving the bilateral occipital lobes and anterior left pons.   Patient was admitted to the telemetry ward, he had frequent neuro checks and neurology was consulted. 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  Occluded severe stenosis of nondominant right VBG.  Echocardiogram with preserved LV systolic function.  Patient placed on aspirin and ticagrelor, interventional radiology has been consulted for VBG stenting.  Assessment & Plan:   Principal Problem:   Stroke [facility]) Active Problems:   Slurred speech   Dyslipidemia   CKD (chronic kidney disease), stage III   DM II (diabetes mellitus, type II), controlled (HCC)   Cerebrovascular disease   Palliative care by specialist   Goals of care, counseling/discussion   Stroke (cerebrum) (HCC)   Altered mental status    1. 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 Patient not yet back to baseline, he will need home health services at discharge. No further confusion or agitation.   Continue medical management with asa and ticagrelor. Statin therapy with atorvastatin. Plan for stenting of VBJ in am per IR.  Continue to follow with neurology recommendations.   2. HTN. Continue blood pressure control with with amlodipine and carvedilol.   3. Uncontrolled T2DM Hgb A1c 6.1 with dyslipidemia. Continue glucose cover and monitoring with insulin sliding scale.   Continue with statin therapy.   4. CKD stage 3a. Renal function stable, continue close follow up on renal function and electrolytes.       Patient continue to be at high risk for recurrent CVA,   Status is: Inpatient  Remains inpatient appropriate because:Inpatient level of care appropriate due to severity of illness   Dispo: The patient is from: Home              Anticipated d/c is to: Home              Anticipated d/c date is: 2 days              Patient currently is not medically stable to d/c.   DVT prophylaxis: Enoxaparin   Code Status:   full  Family Communication:  I spoke with patient's wife  at the bedside, we talked in detail about patient's condition, plan of care and prognosis and all questions were addressed.      Nutrition Status:           Skin Documentation:     Consultants:   Neurology   IR    Subjective: Patient  is feeling better, no nausea or vomiting, his strength is getting better, but not yet back to baseline.   Objective: Vitals:   08/30/19 2314 08/31/19 0319 08/31/19 0320 08/31/19 0732  BP: 108/78  (!) 131/57 (!) 136/88  Pulse: 80  63 92  Resp: 19  15 16   Temp: 98.3 F (36.8 C)  98.3 F (36.8 C) 98.1 F (36.7 C)  TempSrc: Oral  Oral Oral  SpO2: 98% 98% 98% 100%  Weight:      Height:        Intake/Output Summary (Last 24 hours) at 08/31/2019  1219 Last data filed at 08/31/2019 09/02/2019 Gross per 24 hour  Intake 1220 ml  Output 950 ml  Net 270 ml   Filed Weights   08/27/19 0313  Weight: (!) 108.9 kg    Examination:   General: Not in pain or dyspnea, deconditioned  Neurology: Awake and alert, non focal  E ENT: no pallor, no icterus, oral mucosa moist Cardiovascular: No JVD. S1-S2 present, rhythmic, no gallops, rubs, or murmurs. No lower extremity edema. Pulmonary: positive breath sounds bilaterally, adequate air movement, no wheezing, rhonchi or rales. Gastrointestinal. Abdomen soft and non tender Skin. No rashes Musculoskeletal: no joint deformities     Data Reviewed: I have personally reviewed following labs and imaging studies  CBC: Recent Labs  Lab 08/26/19 0658 08/26/19 0726 08/26/19 1911 08/27/19 1815 08/28/19 0407 08/30/19 0606 08/31/19 0826  WBC 7.9   < > 6.9 6.2 5.8 8.1 7.2  NEUTROABS 5.4  --   --  3.8 3.3 5.5 5.3  HGB 14.0   < > 14.4 13.1 12.8* 14.1 14.3  HCT 40.9   < > 42.2 37.9* 37.2* 41.4 41.2  MCV 95.1   < > 94.8 92.4 92.3 93.5 93.0  PLT 281   < > 278 245 220 292 322   < > = values in this interval not displayed.   Basic Metabolic Panel: Recent Labs  Lab 08/26/19 0658 08/26/19 0658 08/26/19 0726 08/26/19 0726 08/26/19 1911 08/27/19 1815 08/28/19 0407 08/30/19 0606 08/31/19 0826  NA 138   < > 142  --   --  138 139 139 137  K 3.7   < > 3.6  --   --  3.7 3.5 3.9 3.6  CL 103   < > 102  --   --  102 105 104 102  CO2 25  --   --   --   --  25 25 25 26   GLUCOSE 163*   < > 160*  --   --  129* 114* 126* 192*  BUN 16   < > 18  --   --  15 14 13 15   CREATININE 1.56*   < > 1.60*   < > 1.47* 1.35* 1.44* 1.42* 1.54*  CALCIUM 9.4  --   --   --   --  8.9 8.9 9.0 9.4  MG  --   --   --   --   --  1.8 2.0 2.0 2.0  PHOS  --   --   --   --   --  3.6 3.5 3.0 2.8   < > = values in this interval not displayed.   GFR: Estimated Creatinine Clearance: 56.8 mL/min (A) (by C-G formula based on SCr of 1.54  mg/dL (H)). Liver Function Tests: Recent Labs  Lab 08/26/19 0658 08/27/19 1815 08/28/19 0407 08/30/19 0606 08/31/19 0826  AST 23 24 21 30 31   ALT 22  23 19 29 31   ALKPHOS 106 122 112 121 134*  BILITOT 0.8 0.8 0.8 0.9 1.4*  PROT 7.4 6.8 6.3* 7.2 7.3  ALBUMIN 3.4* 3.2* 3.0* 3.5 3.5   No results for input(s): LIPASE, AMYLASE in the last 168 hours. No results for input(s): AMMONIA in the last 168 hours. Coagulation Profile: Recent Labs  Lab 08/26/19 0658 08/29/19 1825  INR 1.0 1.1   Cardiac Enzymes: No results for input(s): CKTOTAL, CKMB, CKMBINDEX, TROPONINI in the last 168 hours. BNP (last 3 results) No results for input(s): PROBNP in the last 8760 hours. HbA1C: No results for input(s): HGBA1C in the last 72 hours. CBG: Recent Labs  Lab 08/30/19 1556 08/30/19 2033 08/30/19 2312 08/31/19 0317 08/31/19 0730  GLUCAP 131* 113* 112* 119* 113*   Lipid Profile: No results for input(s): CHOL, HDL, LDLCALC, TRIG, CHOLHDL, LDLDIRECT in the last 72 hours. Thyroid Function Tests: No results for input(s): TSH, T4TOTAL, FREET4, T3FREE, THYROIDAB in the last 72 hours. Anemia Panel: No results for input(s): VITAMINB12, FOLATE, FERRITIN, TIBC, IRON, RETICCTPCT in the last 72 hours.    Radiology Studies: I have reviewed all of the imaging during this hospital visit personally     Scheduled Meds: . aspirin EC  81 mg Oral Daily  . atorvastatin  80 mg Oral q1800  . febuxostat  40 mg Oral Daily  . FLUoxetine  10 mg Oral Daily  . heparin  5,000 Units Subcutaneous Q8H  . insulin aspart  0-6 Units Subcutaneous Q4H  . multivitamin with minerals  1 tablet Oral Daily  . sodium chloride flush  3 mL Intravenous Once  . ticagrelor  90 mg Oral BID   Continuous Infusions: . sodium chloride Stopped (08/27/19 0042)  . sodium chloride 50 mL/hr at 08/30/19 1500     LOS: 4 days        Moody Robben 09/01/19, MD

## 2019-08-31 NOTE — Progress Notes (Signed)
This chaplain was pastorally present with the Pt. and Pt. wife-[name] for pre-procedure spiritual care.  The Pt. accepted prayer from the chaplain.

## 2019-08-31 NOTE — Progress Notes (Signed)
Physical Therapy Treatment Patient Details Name: [name] MRN: [id number] DOB: [birth year] Today's Date: 08/31/2019    History of Present Illness Pt is a 69 y.o. male presenting 08/26/19 with onset of dysequilibrium, slurred speech and difficulty walking while in [facility], went to ED but too long of a wait so came to [facility] when they came back. 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 S/p diagnostic cerebral arteriogram 7/26 revealing severe symptomatic basilar artery stenosis, thought to be cause of recent CVA. Plan for vertebro-basilar junction stenting 7/29.    PT Comments    Pt tolerates treatment well with improved endurance this session. Pt does continue to demonstrate significant vision deficits, unable to read any words during this session, only some large print letters on signs. Pt bumps into 2 doors during session and demonstrates increased lateral sway which increases his falls risk significantly. Pt will benefit from continued acute PT POC to improve mobility quality and to reduce falls risk. PT recommends discharge home with HHPT and use of a RW for all out of bed mobility at this time.  Follow Up Recommendations  Home health PT;Supervision/Assistance - 24 hour     Equipment Recommendations  None recommended by PT    Recommendations for Other Services       Precautions / Restrictions Precautions Precautions: Fall;Other (comment) Restrictions Weight Bearing Restrictions: No    Mobility  Bed Mobility               General bed mobility comments: pt received and left up in recliner  Transfers Overall transfer level: Needs assistance Equipment used: None Transfers: Sit to/from Stand Sit to Stand: Min guard Stand pivot transfers: Min guard          Ambulation/Gait Ambulation/Gait assistance: Min guard;Min assist Gait Distance (Feet):  300 Feet Assistive device: None Gait Pattern/deviations: Step-through pattern;Wide base of support;Decreased stride length Gait velocity: reduced Gait velocity interpretation: <1.8 ft/sec, indicate of risk for recurrent falls General Gait Details: slowed step through gait with increased lateral sway and drift. Pt bumping into doorway on R side twice during session, likely due to significant vision impairments and left so a neglect/inattention   Stairs             Wheelchair Mobility    Modified Rankin (Stroke Patients Only) Modified Rankin (Stroke Patients Only) Pre-Morbid Rankin Score: Slight disability Modified Rankin: Moderately severe disability     Balance Overall balance assessment: Needs assistance Sitting-balance support: No upper extremity supported;Feet supported Sitting balance-Leahy Scale: Good     Standing balance support: No upper extremity supported;During functional activity Standing balance-Leahy Scale: Poor Standing balance comment: minG-minA for static standing balance                            Cognition Arousal/Alertness: Awake/alert Behavior During Therapy: WFL for tasks assessed/performed Overall Cognitive Status: Within Functional Limits for tasks assessed                                        Exercises Other Exercises Other Exercises: dynamic standing balance task of marching to improve SLS balance and unilateral LE power and stability    General Comments General comments (skin integrity, edema, etc.): VSS, pt does report discomfort and swelling in  R knee similar to that of when he has a gout flare      Pertinent Vitals/Pain Pain Assessment: Faces Faces Pain Scale: Hurts even more Pain Location: R knee Pain Descriptors / Indicators: Sore Pain Intervention(s): Monitored during session;Ice applied    Home Living                      Prior Function            PT Goals (current goals can now be  found in the care plan section) Acute Rehab PT Goals Patient Stated Goal: to be able to do more for himself Progress towards PT goals: Progressing toward goals    Frequency    Min 4X/week      PT Plan Current plan remains appropriate    Co-evaluation              AM-PAC PT "6 Clicks" Mobility   Outcome Measure  Help needed turning from your back to your side while in a flat bed without using bedrails?: None Help needed moving from lying on your back to sitting on the side of a flat bed without using bedrails?: None Help needed moving to and from a bed to a chair (including a wheelchair)?: A Little Help needed standing up from a chair using your arms (e.g., wheelchair or bedside chair)?: A Little Help needed to walk in hospital room?: A Little Help needed climbing 3-5 steps with a railing? : A Lot 6 Click Score: 19    End of Session Equipment Utilized During Treatment: Gait belt Activity Tolerance: Patient tolerated treatment well Patient left: in chair;with call bell/phone within reach;with family/visitor present Nurse Communication: Mobility status PT Visit Diagnosis: Muscle weakness (generalized) (M62.81);Difficulty in walking, not elsewhere classified (R26.2)     Time: 1856-3149 PT Time Calculation (min) (ACUTE ONLY): 18 min  Charges:  $Gait Training: 8-22 mins                     Arlyss Gandy, PT, DPT Acute Rehabilitation Pager: [phone]    Arlyss Gandy 08/31/2019, 2:25 PM

## 2019-08-31 NOTE — Progress Notes (Signed)
STROKE TEAM PROGRESS NOTE   INTERVAL HISTORY No acute event overnight. Pt neuro stable, unchanged. Plan for VBJ stenting tomorrow with Dr. Corliss Skains.    OBJECTIVE Vitals:   08/30/19 2314 08/31/19 0319 08/31/19 0320 08/31/19 0732  BP: 108/78  (!) 131/57 (!) 136/88  Pulse: 80  63 92  Resp: 19  15 16   Temp: 98.3 F (36.8 C)  98.3 F (36.8 C) 98.1 F (36.7 C)  TempSrc: Oral  Oral Oral  SpO2: 98% 98% 98% 100%  Weight:      Height:       CBC:  Recent Labs  Lab 08/30/19 0606 08/31/19 0826  WBC 8.1 7.2  NEUTROABS 5.5 5.3  HGB 14.1 14.3  HCT 41.4 41.2  MCV 93.5 93.0  PLT 292 322   Basic Metabolic Panel:  Recent Labs  Lab 08/30/19 0606 08/31/19 0826  NA 139 137  K 3.9 3.6  CL 104 102  CO2 25 26  GLUCOSE 126* 192*  BUN 13 15  CREATININE 1.42* 1.54*  CALCIUM 9.0 9.4  MG 2.0 2.0  PHOS 3.0 2.8   Lipid Panel:     Component Value Date/Time   CHOL 96 08/27/2019 1815   TRIG 96 08/27/2019 1815   HDL 22 (L) 08/27/2019 1815   CHOLHDL 4.4 08/27/2019 1815   VLDL 19 08/27/2019 1815   LDLCALC 55 08/27/2019 1815   HgbA1c:  Lab Results  Component Value Date   HGBA1C 6.2 (H) 08/27/2019   Urine Drug Screen: No results found for: LABOPIA, COCAINSCRNUR, LABBENZ, AMPHETMU, THCU, LABBARB  Alcohol Level No results found for: [facility]  IMAGING  CT HEAD WO CONTRAST 08/26/2019 1. No acute intracranial abnormality. Signs of prior infarct, atrophy and chronic microvascular ischemic change.  2. Infarcts exhibited recent MRI not well seen on today's study with evidence of remote infarct in the bilateral occipital poles but without signs of acute intracranial process.  3. Bilateral maxillary sinus disease, new since previous imaging worse on the RIGHT.    MR BRAIN WO CONTRAST 08/26/2019 Acute infarcts involving the bilateral occipital lobes and anterior left pons. Remote infarcts involving the bilateral occipital lobes, bilateral cerebellum, dorsal left midbrain, bilateral centrum  semiovale, bilateral thalami and left frontal white matter. 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 2.Oclluded v severe stenosis of non dominant RT VBJ marred by motion artifact. 3.Approx 50 % stenosis LT MCA M 1 seg.  ECHOCARDIOGRAM COMPLETE 08/27/2019 1. Left ventricular ejection fraction, by estimation, is 45 to 50%. The left ventricle has mildly decreased function. The left ventricle demonstrates global hypokinesis. There is mild concentric left ventricular hypertrophy. Left ventricular diastolic parameters are indeterminate.  2. Right ventricular systolic function is normal. The right ventricular size is normal.  3. The mitral valve is normal in structure. Trivial mitral valve regurgitation. No evidence of mitral stenosis.  4. The aortic valve is tricuspid. Aortic valve regurgitation is mild.  5. The inferior vena cava is normal in size with greater than 50% respiratory variability, suggesting right atrial pressure of 3 mmHg. Conclusion(s)/Recommendation(s): No intracardiac source of embolism detected on this transthoracic study.  ECG - SR rate 83 BPM. (See cardiology reading for complete details)   PHYSICAL EXAM   Temp:  [97.8 F (36.6 C)-98.3 F (36.8 C)] 98.1 F (36.7 C) (07/28 0732) Pulse Rate:  [63-95] 92 (07/28 0732) Resp:  [12-19] 16 (  07/28 0732) BP: (108-162)/(57-96) 136/88 (07/28 0732) SpO2:  [96 %-100 %] 100 % (07/28 0732)  General - Well nourished, well developed, in no apparent distress.  Ophthalmologic - fundi not visualized due to noncooperation.  Cardiovascular - Regular rhythm and rate.  Neuro - awake, alert, orientated to time place and people.  No aphasia, however mild dysarthria.  Follows simple commands, able to name and repeat.  Visual field grossly full with finger moving test but not counting  fingers, decreased visual acuity bilaterally.  Extraocular movement grossly intact, denies diplopia.  Right nasolabial fold flattening, tongue midline.  Right upper extremity 4+/5 with pronator drift.  Right upper extremity and bilateral lower extremity 5/5.  Bilateral finger-to-nose intact, however bilateral heel-to-shin mild dysmetria.  Sensation symmetrical.  Gait not tested.   ASSESSMENT/PLAN [name] is a 69 y.o. male with history of HLD (hyperlipidemia), MI (myocardial infarction) (HCC), and prior Strokes (HCC) who presents with dysarthria.  He did not receive IV t-PA.  Strokes: Acute infarcts involving the bilateral occipital lobes and anterior left pons - large vessel disease due to VA stenosis/occlusion  CT head -  No acute intracranial abnormality  MRI head - Acute infarcts involving the bilateral occipital lobes and anterior left pons. Remote infarcts involving the bilateral occipital lobes, bilateral cerebellum, dorsal left midbrain, bilateral centrum semiovale, bilateral thalami and left frontal white matter. 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   CTA head and neck - right V4 occluded. Left VBJ high grade stenosis, BA patent  Cerebral angio - Left VBJ 95% stenosis. Right VA distal occlusion.  2D Echo - EF 45-50%  Sars Corona Virus 2 - negative  LDL - 55  HgbA1c 6.2  P2Y12 39  VTE prophylaxis - [place] Heparin  aspirin 81 mg daily and clopidogrel 75 mg daily prior to admission, now on aspirin 81 mg daily and Brilinta (ticagrelor) 90 mg bid in preparation for stenting  Patient counseled to be compliant with his antithrombotic medications  Ongoing aggressive stroke risk factor management  Therapy recommendations:  HH PT, HH OT, HH SLP  Disposition:  Pending  Recurrent posterior infarcts  05/2019 MRI bilateral occipital PCA infarcts, CTA head and neck positive for Severe stenosis of the bilateral VBJ, and moderate to severe  stenoses of both proximal Vertebral Arteries.  60% stenosis Left ICA bulb due to soft plaque. Moderate bilateral. ICA siphon stenosis due to bulky plaque. Mild left MCA M1 stenosis. Mild to moderate ACA A2 stenoses.  07/2019 Multiple acute infarcts of the cerebellumand occipital lobes  bilaterally as well as the left thalamus and posterior left mid brain. CTA H&N- June 2021- severe bilateral vertebrobasilar junction stenosis and advanced atherosclerotic changes elsewhere in the head and neck including moderate to severe bilateral vertebral artery origin stenosis, 60% stenosis left ICA bulb.  cerebral angio Left VBJ 95% stenosis. Right VA distal occlusion.  On aspirin 81 and Brilinta 90 bid  P2Y12 39  Plan for VBJ stenting tomorrow  Hypertension  Home BP meds: Norvasc ; Coreg  Current BP meds: None  Stable at 140-160 . Long-term BP goal 130-160 give posterior stenosis  Hyperlipidemia  Home Lipid lowering medication: Lipitor 80 mg daily  LDL - 55 goal < 70  Current lipid lowering medication: Lipitor 80 mg daily   Continue statin at discharge  DM type II, controlled  HgbA1c 6.2  No home meds  Very sensitive SSI  Other Stroke Risk Factors  Advanced age  Previous ETOH use.  Obesity, Body mass index is 34.44 kg/m., recommend weight loss, diet and exercise as appropriate   Coronary artery disease  Other Active Problems  Code status - Full code CKD - stage 3a - creatinine - 1.56->1.60->1.47->1.35->1.44->1.42->1.54  Palliative Care Consult - 08/28/19 - Full code. Wife reviewing literature. OP f/u.  Hospital day # 4  Marvel Plan, MD PhD Stroke Neurology 08/31/2019 11:28 AM   To contact Stroke Continuity provider, please refer to [email or web address]. After hours, contact General Neurology

## 2019-08-31 NOTE — Progress Notes (Signed)
Modified Barium Swallow Progress Note  Patient Details  Name: [name] MRN: [id number] Date of Birth: Dec 07, 1950  Today's Date: 08/31/2019  Modified Barium Swallow completed.  Full report located under Chart Review in the Imaging Section.  Brief recommendations include the following:  Clinical Impression   Pt presents with a mild oropharyngeal dysphagia, requiring increased time for oral preparation but with good oral control and clearance. He has mildly reduced laryngeal elevation and laryngeal vestibule closure that results in trace amounts of penetration to the vocal folds, not cleared spontaneously and only partially cleared with a cued throat clear and cough. At times, barium also seems to fall below the vocal folds in trace amounts. Occasional throat clearing was noted throughout testing but never associated with penetration or aspiration. His airway protection is improved with use of a chin tuck, but he does need Min-Mod cues to maintain the chin tuck until he has completed his swallow. Recommend continuing regular diet and thin liquids, using a chin tuck with all liquids.    Swallow Evaluation Recommendations       SLP Diet Recommendations: Regular solids;Thin liquid   Liquid Administration via: Cup;Straw   Medication Administration: Whole meds with puree   Supervision: Staff to assist with self feeding   Compensations: Minimize environmental distractions;Slow rate;Small sips/bites;Chin tuck   Postural Changes: Seated upright at 90 degrees   Oral Care Recommendations: Oral care BID        Mahala Menghini., M.A. CCC-SLP Acute Rehabilitation Services Pager [phone] Office [phone]  08/31/2019,12:32 PM

## 2019-08-31 NOTE — Progress Notes (Signed)
Patient seen this morning during rounds with Dr. Corliss Skains. He had a diagnostic cerebral arteriogram 08/29/19 to evaluate patient for an acute CVA. Imaging revealed severe symptomatic basilar artery stenosis. He is scheduled for an image-guided cerebral arteriogram with possible revascularization (angioplasty/stent placement) of basilar artery stenosis tentatively for Thursday 09/01/2019 in IR with Dr. Corliss Skains.  Patient sitting up in chair, alert and not in acute discomfort or distress. Discussed tomorrow's plan with the patient. Orders in place for tomorrow.  Alwyn Ren, [place] [phone] 08/31/2019, 1:31 PM

## 2019-09-01 ENCOUNTER — Inpatient Hospital Stay (HOSPITAL_COMMUNITY): Payer: Medicare HMO | Admitting: Certified Registered"

## 2019-09-01 ENCOUNTER — Encounter (HOSPITAL_COMMUNITY): Payer: Self-pay | Admitting: Internal Medicine

## 2019-09-01 ENCOUNTER — Inpatient Hospital Stay (HOSPITAL_COMMUNITY): Payer: Medicare HMO

## 2019-09-01 ENCOUNTER — Encounter (HOSPITAL_COMMUNITY)
Admission: EM | Disposition: A | Discharge status: Home Health | Payer: Self-pay | Source: Home / Self Care | Attending: Internal Medicine

## 2019-09-01 DIAGNOSIS — I63219 Cerebral infarction due to unspecified occlusion or stenosis of unspecified vertebral arteries: Secondary | ICD-10-CM | POA: Diagnosis not present

## 2019-09-01 DIAGNOSIS — R451 Restlessness and agitation: Secondary | ICD-10-CM

## 2019-09-01 DIAGNOSIS — N1831 Chronic kidney disease, stage 3a: Secondary | ICD-10-CM | POA: Diagnosis not present

## 2019-09-01 DIAGNOSIS — I6322 Cerebral infarction due to unspecified occlusion or stenosis of basilar arteries: Secondary | ICD-10-CM | POA: Diagnosis not present

## 2019-09-01 DIAGNOSIS — E1159 Type 2 diabetes mellitus with other circulatory complications: Secondary | ICD-10-CM | POA: Diagnosis not present

## 2019-09-01 DIAGNOSIS — R471 Dysarthria and anarthria: Secondary | ICD-10-CM | POA: Diagnosis not present

## 2019-09-01 DIAGNOSIS — I651 Occlusion and stenosis of basilar artery: Secondary | ICD-10-CM | POA: Diagnosis not present

## 2019-09-01 DIAGNOSIS — R410 Disorientation, unspecified: Secondary | ICD-10-CM | POA: Diagnosis not present

## 2019-09-01 DIAGNOSIS — E785 Hyperlipidemia, unspecified: Secondary | ICD-10-CM | POA: Diagnosis not present

## 2019-09-01 HISTORY — PX: IR INTRA CRAN STENT: IMG2345

## 2019-09-01 HISTORY — PX: RADIOLOGY WITH ANESTHESIA: SHX6223

## 2019-09-01 LAB — BASIC METABOLIC PANEL
Anion gap: 11 (ref 5–15)
BUN: 17 mg/dL (ref 8–23)
CO2: 23 mmol/L (ref 22–32)
Calcium: 9 mg/dL (ref 8.9–10.3)
Chloride: 104 mmol/L (ref 98–111)
Creatinine, Ser: 1.44 mg/dL — ABNORMAL HIGH (ref 0.61–1.24)
GFR calc Af Amer: 57 mL/min — ABNORMAL LOW (ref >60)
GFR calc non Af Amer: 50 mL/min — ABNORMAL LOW (ref >60)
Glucose, Bld: 129 mg/dL — ABNORMAL HIGH (ref 70–99)
Potassium: 3.5 mmol/L (ref 3.5–5.1)
Sodium: 138 mmol/L (ref 135–145)

## 2019-09-01 LAB — GLUCOSE, CAPILLARY
Glucose-Capillary: 119 mg/dL — ABNORMAL HIGH (ref 70–99)
Glucose-Capillary: 121 mg/dL — ABNORMAL HIGH (ref 70–99)
Glucose-Capillary: 124 mg/dL — ABNORMAL HIGH (ref 70–99)
Glucose-Capillary: 124 mg/dL — ABNORMAL HIGH (ref 70–99)
Glucose-Capillary: 150 mg/dL — ABNORMAL HIGH (ref 70–99)
Glucose-Capillary: 175 mg/dL — ABNORMAL HIGH (ref 70–99)

## 2019-09-01 LAB — POCT ACTIVATED CLOTTING TIME
Activated Clotting Time: 192 seconds
Activated Clotting Time: 202 seconds

## 2019-09-01 LAB — HEPARIN LEVEL (UNFRACTIONATED): Heparin Unfractionated: 0.31 IU/mL (ref 0.30–0.70)

## 2019-09-01 LAB — MRSA PCR SCREENING: MRSA by PCR: NEGATIVE

## 2019-09-01 IMAGING — XA IR INTRACRANIAL STENT (INCL PTA)
10 of 23 series · 10 of 24 positions shown · IV contrast (IODINE)
Comparison: Diagnostic arteriogram [DATE].

CLINICAL DATA: Patient with symptomatic severe dominant
vertebrobasilar stenosis with recurrent strokes.

EXAM:
INTRACRANIAL STENT (INCL PTA)
TECHNIQUE: Informed written consent was obtained from the patient after a
thorough discussion of the procedural risks, benefits and
alternatives. All questions were addressed. Maximal Sterile Barrier
Technique was utilized including caps, mask, sterile gowns, sterile
gloves, sterile drape, hand hygiene and skin antiseptic. A timeout
was performed prior to the initiation of the procedure.

[Series 2: cerebral · 1 of 2 frames shown (1 of 9)]
[frame 2/2]
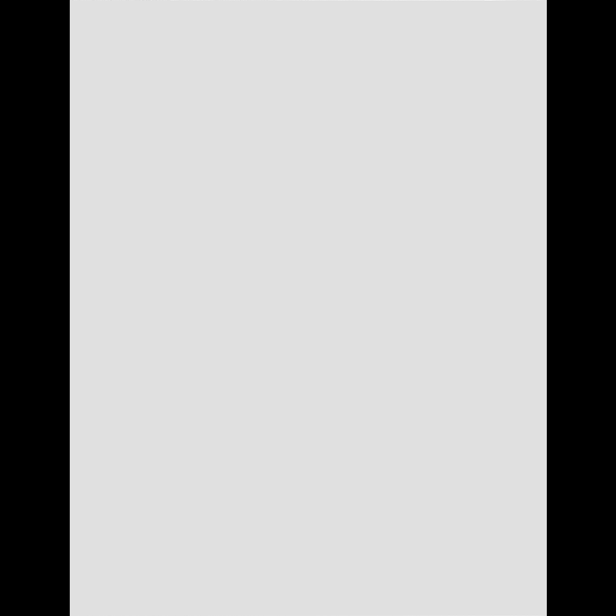

[Series 4: cerebral · 2 acquisitions, 1 frame shown (2 of 9)]
[im 1/2]
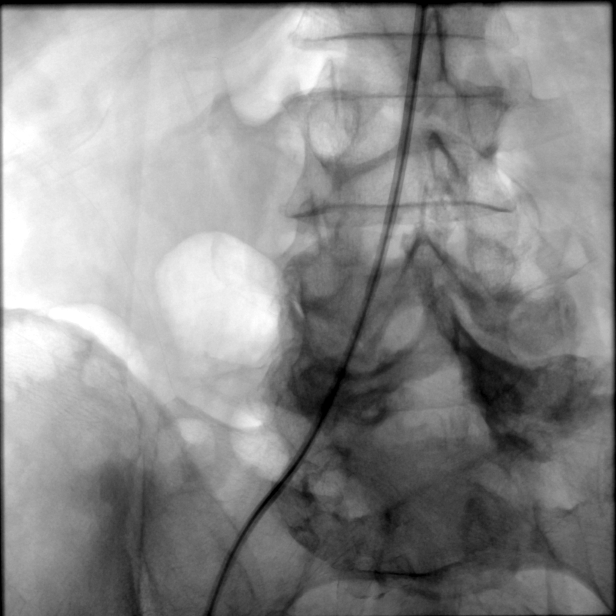

[Series 8: cerebral · 6 acquisitions, 1 frame shown (3 of 9)]
[im 1/6]
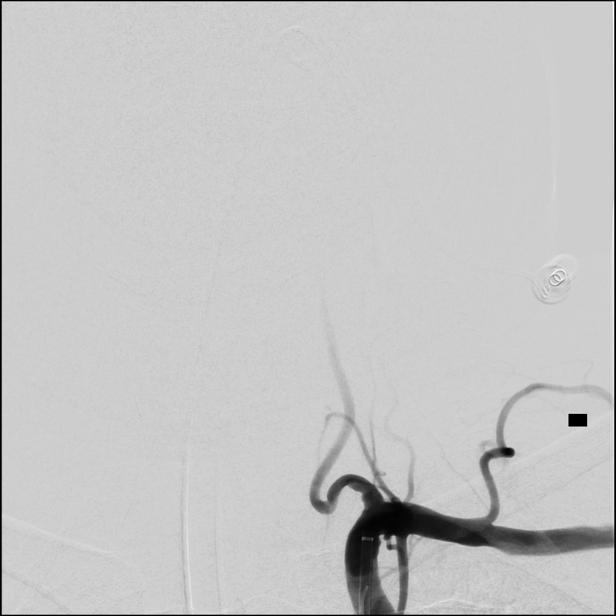

[Series 9: cerebral · 6 acquisitions, 1 frame shown (4 of 9)]
[im 1/6]
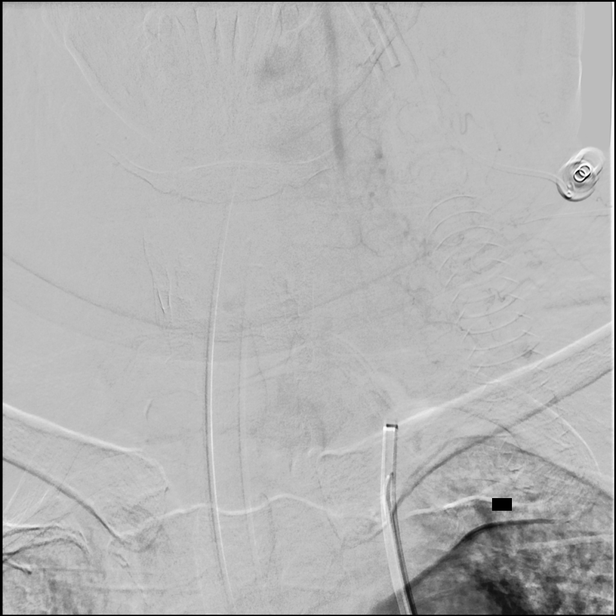

[Series 11: cerebral · 6 acquisitions, 1 frame shown (5 of 9)]
[im 1/6]
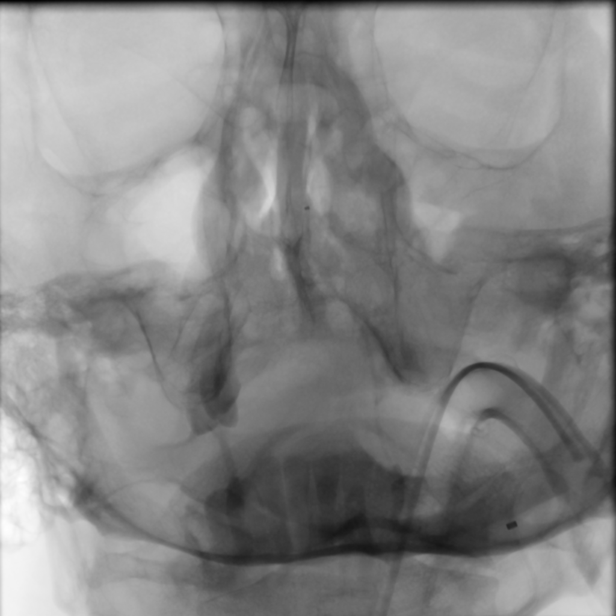

[Series 14: cerebral · 6 acquisitions, 1 frame shown (6 of 9)]
[im 1/6]
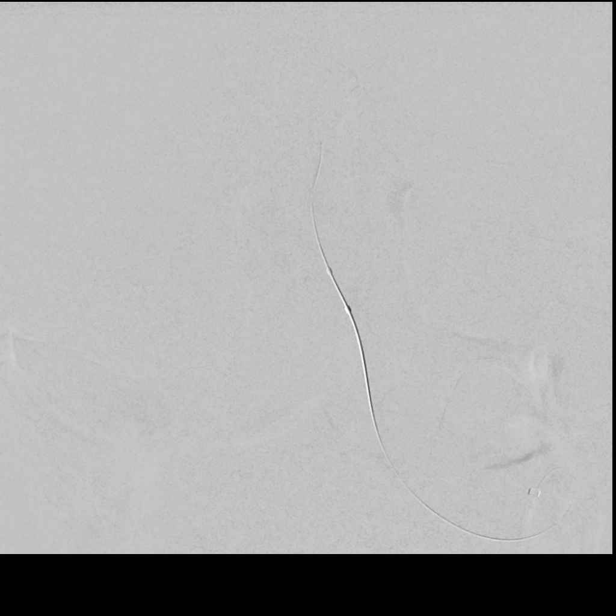

[Series 16: cerebral · 8 acquisitions, 1 frame shown (7 of 9)]
[im 1/8]
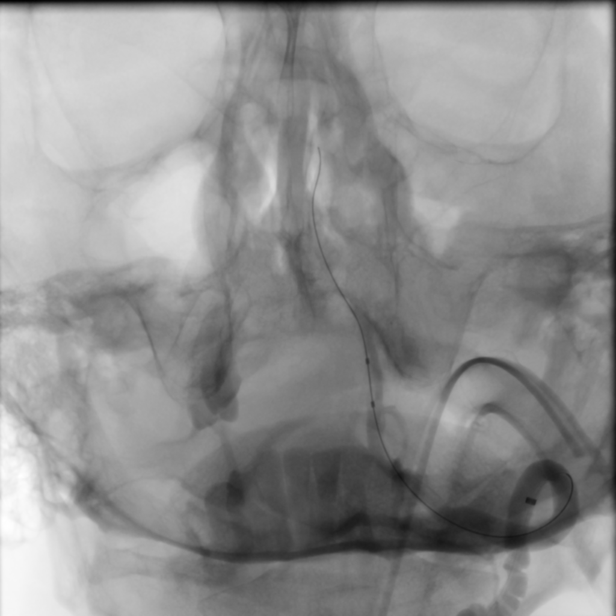

[Series 19: cerebral · 6 acquisitions, 1 frame shown (8 of 9)]
[im 1/6]
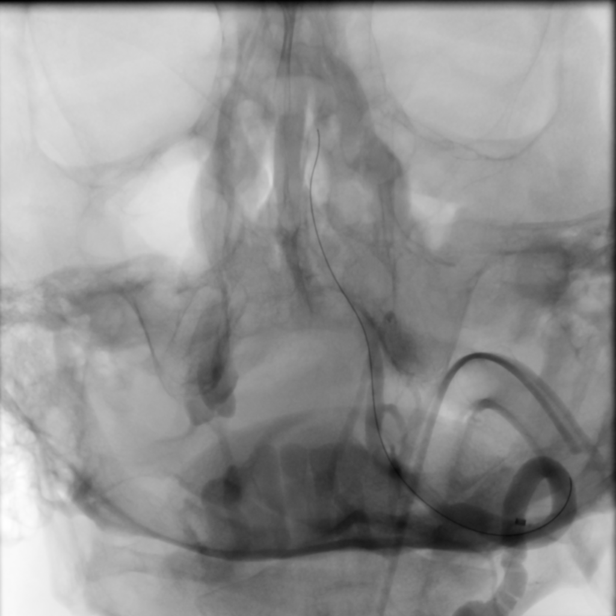

[Series 21: cerebral · 6 acquisitions, 1 frame shown (9 of 9)]
[im 1/6]
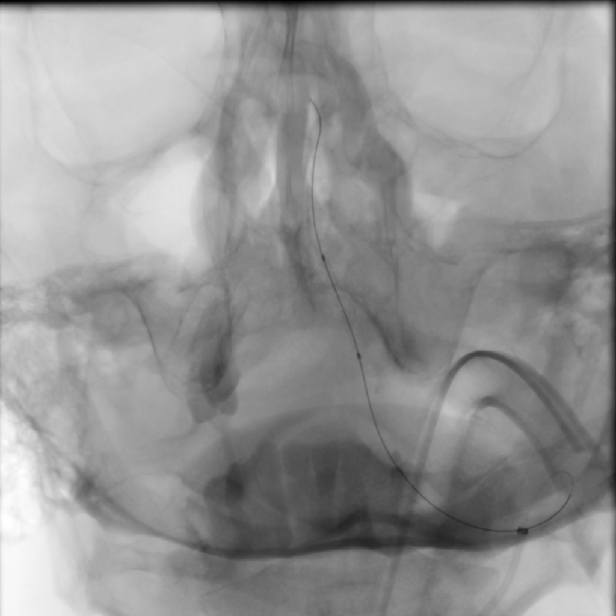

[Series 23: single · 1 of 4 slices shown]
[im 1/4]
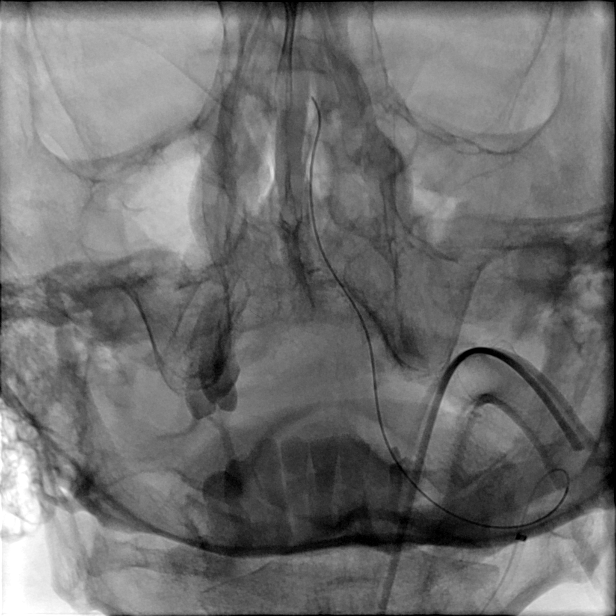

[10 of 24 positions shown; findings below may reference images not displayed]

MEDICATIONS:
Heparin 3,000 units IV. Ancef 2 g IV antibiotic was administered
within 1 hour of the procedure.

ANESTHESIA/SEDATION:
General anesthesia

CONTRAST:  Isovue 300 approximately 150 mL

FLUOROSCOPY TIME:  Fluoroscopy Time: 44 minutes 48 seconds ([56]
mGy).

COMPLICATIONS:
None immediate.
The right groin was prepped and draped in the usual sterile fashion.
Thereafter using modified Seldinger technique, transfemoral access
into the right common femoral artery was obtained without
difficulty. Over a 0.035 inch guidewire, a 5 French Pinnacle sheath
was inserted. Through this, and also over 0.035 inch guidewire, a 5
French JB 1 catheter was advanced to the aortic arch region and
selectively positioned in the left subclavian artery.
FINDINGS: The left subclavian arteriogram demonstrates the origin of the left
vertebral artery to be widely patent.

There is moderate tortuosity just distal to the origin. More
distally the vessel is seen to opacify to the cranial skull base.
Patency is seen of the left posterior-inferior cerebellar artery.

However, significantly decrease hemodynamic flow is seen distal to
this to a now nearly completely occluded left vertebrobasilar
junction with faint opacification seen in the proximal basilar
artery.

ENDOVASCULAR REVASCULARIZATION OF NEAR COMPLETE OCCLUSION OF THE
LEFT VERTEBROBASILAR JUNCTION

The diagnostic JB 1 catheter in the distal left subclavian artery
was exchanged over a 0.035 inch 300 cm Rosen exchange guidewire for
an 8 French short Pinnacle sheath in the right groin which was
connected to continuous heparinized saline infusion. Over the Rosen
exchange guidewire, a 90 cm 8 French Neuron Max sheath was advanced
and positioned just proximal to the origin of the left vertebral
artery.

The guidewire was removed. Good aspiration obtained from the Neuron
Max sheath. A gentle control arteriogram performed through this
demonstrates safe positioning of the tip of the catheter with no
evidence of spasms, dissections or of intraluminal filling defects.
Over a 0.035 inch Roadrunner guidewire, a 115 cm 5 French Catalyst
guide catheter was now advanced into the distal left vertebral
artery. Guidewire was removed. Good aspiration obtained from the hub
of the Catalyst guide catheter. A control arteriogram performed
through this continued to demonstrate near complete occlusion of the
left vertebrobasilar junction with faint opacification of the
proximal basilar artery.

At this time, in a coaxial manner and with constant heparinized
saline infusion, over a 0.14 inch standard Synchro micro guidewire
with a J configuration, an 021 Trevo ProVue microcatheter was
advanced to just distal to the origin of the left posterior-inferior
cerebellar artery.

Using a torque device, the micro guidewire was gingerly advanced
through the occluded left vertebrobasilar junction to the distal
basilar artery followed by the microcatheter. The guidewire was
removed. Good aspiration obtained from the hub of the microcatheter.
A gentle control arteriogram performed through the microcatheter
demonstrated safe positioning of the tip of the microcatheter which
was exchanged for a 014 inch 300 cm Zoom exchange guidewire, which
was positioned in the right posterior cerebral artery P1 segment.

A 2 mm x 9 mm Apex angioplasty balloon catheter was prepped and
purged with 50% contrast and 50% heparinized saline infusion. Using
the rapid exchange technique, this was advanced without difficulty
and positioned with its markers just covering the site of the severe
stenosis.

Control inflation was then performed using a micro inflation syringe
device via micro tubing to 5.3 atmospheres where it was maintained
for approximately 1.5 minutes. The balloon was then gently deflated.
A control arteriogram performed through the Catalyst guide catheter
demonstrated no significantly improved flow with only mild
improvement in the caliber. Flow was now noted into the distal
posterior circulation territory.

A second angioplasty was then performed two 4.3 atmospheres where it
was maintained for approximately 1 minute. The balloon was deflated
and retrieved. A control arteriogram performed through the Catalyst
guide catheter demonstrated improved flow. However, there was now
evidence of recoiling. A 2 mm x 15 mm Apex balloon was prepped and
purged with heparinized saline infusion and advanced again using the
rapid exchange technique and positioned adequate distant from the
site of the previous angioplasty. At this time a slow control
angioplasty was performed using micro inflation syringe device via
micro tubing to approximately 2. 3 mm where it was maintained for
approximately 1-1/2 minutes. Following deflation, balloon was
retrieved more proximally. A control arteriogram performed through
the Catalyst guide catheter now demonstrated improved flow and
caliber through the angioplastied segment. However, there continued
be evidence of recoil due to the partially calcified plaque. Balloon
was then retrieved and removed. A 2.25 mm x 15 mm Resolute Onyx
stent was prepped and purged with heparinized saline infusion and
advanced again using the rapid exchange technique and positioned
adequate distant from the site of the previous angioplasty of the
left vertebrobasilar junction.

Slow inflation of the balloon was then performed using a micro
inflation syringe device via micro tubing to approximately 2.3 mm
where it was maintained for approximately 30 seconds. The balloon
was then deflated and retrieved proximally and removed. A control
arteriogram performed through the Catalyst guide catheter
demonstrated excellent flow through now the significantly improved
caliber and flow through the angioplastied segment of the dominant
left vertebrobasilar junction into the basilar artery. The posterior
cerebral arteries, the superior cerebellar arteries and the
anterior-inferior cerebellar arteries demonstrated patency without
any gross intraluminal filling defects.

Control arteriograms were then performed at 15 and 35 minutes post
deployment of the stent. These continued to demonstrate flow through
the stented segment without any evidence of intraluminal filling
defects or occlusions.

At this time the patient was also given approximately 4.5 mg of
Integrilin intra-arterially to ensure no acute platelet aggregation
at the site of the stent placement.

None was observed.

Additionally the patient received 25 mcg of nitroglycerin for
treatment of vasospasm.

The exchange micro guidewire was eventually removed. A final control
arteriogram performed through the 5 French Catalyst guide catheter
in left vertebral artery continued to demonstrate excellent flow
through the angioplastied segment of the dominant left
vertebrobasilar junction with a TICI 3 revascularization.

Throughout the procedure, the patient's blood pressure and
neurological status remained stable. No evidence of mass effect or
of extravasation was noted.

The 8 French right groin sheath was removed and manual compression
applied for hemostasis.

Distal pulses remained Dopplerable in the posterior tibial, and the
dorsalis pedis arteries bilaterally.

The patient's general anesthesia was then reversed and the patient
was extubated without difficulty. Upon recovery, the patient was
able to breathe on his own. He started moving all his 4 extremities
with slight weakness in the right upper extremity which eventually
improved.

Patient denied any headaches, nausea or vomiting.

Patient continued to complain of the vision difficulties which he
had prior to the procedure.

He was then transferred to the PACU and eventually to the neuro ICU
for post revascularization management.
IMPRESSION: Status post endovascular revascularization of near complete
occlusion of the dominant left vertebrobasilar junction with stent
assisted angioplasty with restoration of TICI 3 revascularization.

PLAN:
Follow-up in the clinic 2-4 weeks post discharge.

## 2019-09-01 SURGERY — IR WITH ANESTHESIA
Anesthesia: General

## 2019-09-01 MED ORDER — LORAZEPAM 2 MG/ML IJ SOLN
INTRAMUSCULAR | Status: AC
  Filled 2019-09-01: qty 1

## 2019-09-01 MED ORDER — ONDANSETRON HCL 4 MG/2ML IJ SOLN: 4.0000 mg | Freq: Four times a day (QID) | INTRAMUSCULAR | Status: DC | PRN

## 2019-09-01 MED ORDER — LACTATED RINGERS IV SOLN: INTRAVENOUS | Status: DC

## 2019-09-01 MED ORDER — DEXMEDETOMIDINE HCL 200 MCG/2ML IV SOLN
20.0000 ug | Freq: Once | INTRAVENOUS | Status: AC
  Administered 2019-09-01: 20 ug via INTRAVENOUS

## 2019-09-01 MED ORDER — PHENYLEPHRINE HCL-NACL 10-0.9 MG/250ML-% IV SOLN
INTRAVENOUS | Status: DC | PRN
  Administered 2019-09-01: 15 ug/min via INTRAVENOUS

## 2019-09-01 MED ORDER — ASPIRIN 81 MG PO CHEW
81.0000 mg | CHEWABLE_TABLET | Freq: Every day | ORAL | Status: DC
  Administered 2019-09-02 – 2019-09-03 (×2): 81 mg via ORAL
  Filled 2019-09-01 (×2): qty 1

## 2019-09-01 MED ORDER — ACETAMINOPHEN 160 MG/5ML PO SOLN: 650.0000 mg | ORAL | Status: DC | PRN

## 2019-09-01 MED ORDER — ASPIRIN EC 325 MG PO TBEC
DELAYED_RELEASE_TABLET | ORAL | Status: AC
  Filled 2019-09-01: qty 1

## 2019-09-01 MED ORDER — EPTIFIBATIDE 20 MG/10ML IV SOLN
INTRAVENOUS | Status: DC | PRN
  Administered 2019-09-01 (×3): 1.5 mg via INTRAVENOUS

## 2019-09-01 MED ORDER — HEPARIN (PORCINE) 25000 UT/250ML-% IV SOLN
750.0000 [IU]/h | INTRAVENOUS | Status: DC
  Filled 2019-09-01: qty 250

## 2019-09-01 MED ORDER — CEFAZOLIN SODIUM-DEXTROSE 2-4 GM/100ML-% IV SOLN
INTRAVENOUS | Status: AC
  Filled 2019-09-01: qty 100

## 2019-09-01 MED ORDER — ASPIRIN 81 MG PO CHEW: 81.0000 mg | CHEWABLE_TABLET | Freq: Every day | ORAL | Status: DC

## 2019-09-01 MED ORDER — LACTATED RINGERS IV SOLN: INTRAVENOUS | Status: DC | PRN

## 2019-09-01 MED ORDER — CLEVIDIPINE BUTYRATE 0.5 MG/ML IV EMUL
INTRAVENOUS | Status: AC
  Filled 2019-09-01: qty 50

## 2019-09-01 MED ORDER — HEPARIN (PORCINE) 25000 UT/250ML-% IV SOLN
INTRAVENOUS | Status: AC
  Filled 2019-09-01: qty 250

## 2019-09-01 MED ORDER — SODIUM CHLORIDE 0.9 % IV SOLN: INTRAVENOUS | Status: DC

## 2019-09-01 MED ORDER — CLEVIDIPINE BUTYRATE 0.5 MG/ML IV EMUL: 0.0000 mg/h | INTRAVENOUS | Status: DC

## 2019-09-01 MED ORDER — NITROGLYCERIN 1 MG/10 ML FOR IR/CATH LAB
INTRA_ARTERIAL | Status: AC
  Filled 2019-09-01: qty 10

## 2019-09-01 MED ORDER — ONDANSETRON HCL 4 MG/2ML IJ SOLN
INTRAMUSCULAR | Status: DC | PRN
  Administered 2019-09-01: 4 mg via INTRAVENOUS

## 2019-09-01 MED ORDER — ESMOLOL HCL 100 MG/10ML IV SOLN
INTRAVENOUS | Status: DC | PRN
Start: 2019-09-01 — End: 2019-09-01
  Administered 2019-09-01: 30 mg via INTRAVENOUS

## 2019-09-01 MED ORDER — ACETAMINOPHEN 325 MG PO TABS
650.0000 mg | ORAL_TABLET | ORAL | Status: DC | PRN
  Administered 2019-09-02 – 2019-09-03 (×2): 650 mg via ORAL
  Filled 2019-09-01 (×2): qty 2

## 2019-09-01 MED ORDER — IOHEXOL 300 MG/ML  SOLN
150.0000 mL | Freq: Once | INTRAMUSCULAR | Status: AC | PRN
  Administered 2019-09-01: 75 mL via INTRA_ARTERIAL

## 2019-09-01 MED ORDER — ASPIRIN EC 325 MG PO TBEC
325.0000 mg | DELAYED_RELEASE_TABLET | Freq: Once | ORAL | Status: AC
  Administered 2019-09-01: 325 mg via ORAL
  Filled 2019-09-01: qty 1

## 2019-09-01 MED ORDER — POLYETHYLENE GLYCOL 3350 17 G PO PACK
17.0000 g | PACK | Freq: Two times a day (BID) | ORAL | Status: DC
  Administered 2019-09-03: 17 g via ORAL
  Filled 2019-09-01: qty 1

## 2019-09-01 MED ORDER — DEXMEDETOMIDINE HCL IN NACL 80 MCG/20ML IV SOLN
INTRAVENOUS | Status: AC
  Filled 2019-09-01: qty 20

## 2019-09-01 MED ORDER — FENTANYL CITRATE (PF) 100 MCG/2ML IJ SOLN: 25.0000 ug | INTRAMUSCULAR | Status: DC | PRN

## 2019-09-01 MED ORDER — DEXMEDETOMIDINE HCL IN NACL 400 MCG/100ML IV SOLN
0.4000 ug/kg/h | INTRAVENOUS | Status: DC
  Administered 2019-09-01: 0.5 ug/kg/h via INTRAVENOUS
  Administered 2019-09-01: 0.4 ug/kg/h via INTRAVENOUS
  Administered 2019-09-02: 0.5 ug/kg/h via INTRAVENOUS
  Filled 2019-09-01 (×2): qty 100

## 2019-09-01 MED ORDER — EPTIFIBATIDE 20 MG/10ML IV SOLN
INTRAVENOUS | Status: AC
  Filled 2019-09-01: qty 10

## 2019-09-01 MED ORDER — SUCCINYLCHOLINE CHLORIDE 20 MG/ML IJ SOLN
INTRAMUSCULAR | Status: DC | PRN
  Administered 2019-09-01: 120 mg via INTRAVENOUS

## 2019-09-01 MED ORDER — ASPIRIN 300 MG RE SUPP: 300.0000 mg | Freq: Once | RECTAL | Status: DC

## 2019-09-01 MED ORDER — ACETAMINOPHEN 650 MG RE SUPP: 650.0000 mg | RECTAL | Status: DC | PRN

## 2019-09-01 MED ORDER — CEFAZOLIN SODIUM-DEXTROSE 2-3 GM-%(50ML) IV SOLR
INTRAVENOUS | Status: DC | PRN
  Administered 2019-09-01: 2 g via INTRAVENOUS

## 2019-09-01 MED ORDER — PROPOFOL 10 MG/ML IV BOLUS
INTRAVENOUS | Status: DC | PRN
  Administered 2019-09-01: 150 mg via INTRAVENOUS

## 2019-09-01 MED ORDER — SODIUM CHLORIDE (PF) 0.9 % IJ SOLN
INTRAVENOUS | Status: DC | PRN
  Administered 2019-09-01: 25 ug via INTRA_ARTERIAL

## 2019-09-01 MED ORDER — TICAGRELOR 90 MG PO TABS
90.0000 mg | ORAL_TABLET | Freq: Two times a day (BID) | ORAL | Status: DC
  Administered 2019-09-02 – 2019-09-03 (×3): 90 mg via ORAL
  Filled 2019-09-01 (×3): qty 1

## 2019-09-01 MED ORDER — CHLORHEXIDINE GLUCONATE CLOTH 2 % EX PADS
6.0000 | MEDICATED_PAD | Freq: Every day | CUTANEOUS | Status: DC
  Administered 2019-09-01 – 2019-09-03 (×2): 6 via TOPICAL

## 2019-09-01 MED ORDER — HEPARIN (PORCINE) 25000 UT/250ML-% IV SOLN: 650.0000 [IU]/h | INTRAVENOUS | Status: DC

## 2019-09-01 MED ORDER — LORAZEPAM 2 MG/ML IJ SOLN
1.0000 mg | Freq: Once | INTRAMUSCULAR | Status: AC
  Administered 2019-09-01: 1 mg via INTRAVENOUS

## 2019-09-01 MED ORDER — CHLORHEXIDINE GLUCONATE 0.12 % MT SOLN
15.0000 mL | Freq: Once | OROMUCOSAL | Status: AC
  Administered 2019-09-01: 15 mL via OROMUCOSAL
  Filled 2019-09-01 (×2): qty 15

## 2019-09-01 MED ORDER — IOHEXOL 300 MG/ML  SOLN
150.0000 mL | Freq: Once | INTRAMUSCULAR | Status: AC | PRN
  Administered 2019-09-01: 100 mL via INTRA_ARTERIAL

## 2019-09-01 MED ORDER — TICAGRELOR 90 MG PO TABS
90.0000 mg | ORAL_TABLET | Freq: Two times a day (BID) | ORAL | Status: DC
  Filled 2019-09-01: qty 1

## 2019-09-01 MED ORDER — LIDOCAINE 2% (20 MG/ML) 5 ML SYRINGE
INTRAMUSCULAR | Status: DC | PRN
  Administered 2019-09-01: 70 mg via INTRAVENOUS

## 2019-09-01 MED ORDER — DEXAMETHASONE SODIUM PHOSPHATE 10 MG/ML IJ SOLN
INTRAMUSCULAR | Status: DC | PRN
  Administered 2019-09-01: 5 mg via INTRAVENOUS

## 2019-09-01 MED ORDER — LORAZEPAM 2 MG/ML IJ SOLN
2.0000 mg | Freq: Once | INTRAMUSCULAR | Status: AC
  Administered 2019-09-01: 2 mg via INTRAVENOUS

## 2019-09-01 MED ORDER — ROCURONIUM BROMIDE 10 MG/ML (PF) SYRINGE
PREFILLED_SYRINGE | INTRAVENOUS | Status: DC | PRN
  Administered 2019-09-01: 20 mg via INTRAVENOUS
  Administered 2019-09-01: 50 mg via INTRAVENOUS
  Administered 2019-09-01: 30 mg via INTRAVENOUS

## 2019-09-01 MED ORDER — FENTANYL CITRATE (PF) 250 MCG/5ML IJ SOLN
INTRAMUSCULAR | Status: DC | PRN
  Administered 2019-09-01 (×2): 50 ug via INTRAVENOUS

## 2019-09-01 MED ORDER — HEPARIN SODIUM (PORCINE) 1000 UNIT/ML IJ SOLN
INTRAMUSCULAR | Status: DC | PRN
  Administered 2019-09-01: 3000 [IU] via INTRAVENOUS

## 2019-09-01 NOTE — Anesthesia Preprocedure Evaluation (Signed)
Anesthesia Evaluation  Patient identified by MRN, date of birth, ID band Patient awake    Reviewed: Allergy & Precautions, NPO status   History of Anesthesia Complications (+) DIFFICULT AIRWAY  Airway Mallampati: II  TM Distance: >3 FB     Dental   Pulmonary neg pulmonary ROS,    breath sounds clear to auscultation       Cardiovascular + Past MI   Rhythm:Regular Rate:Normal     Neuro/Psych CVA    GI/Hepatic negative GI ROS, Neg liver ROS,   Endo/Other  diabetes  Renal/GU Renal disease     Musculoskeletal   Abdominal   Peds  Hematology   Anesthesia Other Findings   Reproductive/Obstetrics                             Anesthesia Physical Anesthesia Plan  ASA: III  Anesthesia Plan: General   Post-op Pain Management:    Induction: Intravenous  PONV Risk Score and Plan: 2 and Dexamethasone, Ondansetron and Midazolam  Airway Management Planned: Oral ETT  Additional Equipment:   Intra-op Plan:   Post-operative Plan: Extubation in OR  Informed Consent: I have reviewed the patients History and Physical, chart, labs and discussed the procedure including the risks, benefits and alternatives for the proposed anesthesia with the patient or authorized representative who has indicated his/her understanding and acceptance.       Plan Discussed with: CRNA and Anesthesiologist  Anesthesia Plan Comments:         Anesthesia Quick Evaluation

## 2019-09-01 NOTE — Progress Notes (Signed)
PROGRESS NOTE    Criag Brown  DJM:[id number] DOB: Apr 19, 1950 DOA: 08/26/2019 PCP: Katherine Basset, PA-C    Brief Narrative:  Patient admitted to the hospital with the working diagnosis of acute infarcts involving the bilateral occipital lobes and anterior left pons, large vessel disease due to VA stenosis/occlusion.  69 year old male with significant past medical history for coronary disease, dyslipidemia and prior CVA who presented with slurred speech, dizziness and difficulty walking.  Recent hospitalization April and June 2021 for acute CVA.  Apparently he recovered well but had recurrent symptoms that prompted him to come back to the hospital.  On his initial physical examination blood pressure 148/86, heart rate 69, temperature 99.4, respiratory rate 14, oxygen saturation 100%.  Patient was awake and alert, his strength was preserved, his lungs were clear to auscultation bilaterally, heart S1-S2, present rhythmic, soft abdomen, no lower extremity edema. Brain MRI showed acute infarcts involving the bilateral occipital lobes and anterior left pons.   Patient was admitted to the telemetry ward, he had frequent neuro checks and neurology was consulted. 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  Occluded severe stenosis of nondominant right VBG.  Echocardiogram with preserved LV systolic function.  Patient placed on aspirin and ticagrelor, interventional radiology has been consulted for VBG stenting.   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 Acute acute infarcts involving the bilateral occipital lobes and anterior left pons, large vessel disease due to VA stenosis/occlusion.  Sp stent angioplasty of left VBJ. He has mild confusion at the PACU, but able to follow commands and answer to simple questions.   On dual antiplatelet therapy with asa and ticagrelor. Continue with atorvastatin.  Post procedure patient has been placed on heparin infusion.   Continue to follow with neurology and IR recommendations.   2. HTN.  On amlodipine and carvedilol for blood pressure control.   3. Uncontrolled T2DM Hgb A1c 6.1 with dyslipidemia. On insulin sliding scale for glucose cover and monitoring   On statin therapy.   4. CKD stage 3a.  Stable renal function with serum cr at 1,44 with K at 3,5 and serum bicarbonate at 23.   5. Constipation. Will add bid miralax to start when patient back on the neuro ICU.   Status is: Inpatient  Remains inpatient appropriate because:Inpatient level of care appropriate due to severity of illness   Dispo: The patient is from: Home              Anticipated d/c is to: Home              Anticipated d/c date is: 1 day              Patient currently is not medically stable to d/c.   DVT prophylaxis: scd   Code Status:   full  Family Communication:  No family at the bedside      Consultants:   Neurology   IR   Procedures:   VBJ stenting left       Subjective: Patient is  post op in the PACU, has mild confusion but easy to redirect. Per nursing patient has been constipated and having right knee and foot pain   Objective: Vitals:   08/31/19 2349 08/31/19 2349 09/01/19 0342 09/01/19 0821  BP: 116/79  127/78 (!) 145/87  Pulse: 79  94 94  Resp: 21  14 15   Temp:  98.4 F (36.9 C) 98.6 F (37 C) 98.1 F (36.7 C)  TempSrc:   Oral Oral  SpO2: 99%  99% 100%  Weight:      Height:        Intake/Output Summary (Last 24 hours) at 09/01/2019 1554 Last  data filed at 09/01/2019 1515 Gross per 24 hour  Intake 1000 ml  Output 880 ml  Net 120 ml   Filed Weights   08/27/19 0313  Weight: (!) 108.9 kg    Examination:   General: deconditioned  Neurology: Awake and alert, non focal. Positive confusion  E ENT: no pallor, no icterus, oral mucosa moist Cardiovascular: No JVD. S1-S2 present, rhythmic, no gallops, rubs, or murmurs. No lower extremity edema. Pulmonary: positive breath sounds bilaterally, adequate air movement, no wheezing, rhonchi or rales. Gastrointestinal. Abdomen soft and non tender Skin. No rashes Musculoskeletal: no joint deformities     Data Reviewed: I have personally reviewed following labs and imaging studies  CBC: Recent Labs  Lab 08/26/19 0658 08/26/19 0726 08/26/19 1911 08/27/19 1815 08/28/19 0407 08/30/19 0606 08/31/19 0826  WBC 7.9   < > 6.9 6.2 5.8 8.1 7.2  NEUTROABS 5.4  --   --  3.8 3.3 5.5 5.3  HGB 14.0   < > 14.4 13.1 12.8* 14.1 14.3  HCT 40.9   < > 42.2 37.9* 37.2* 41.4 41.2  MCV 95.1   < > 94.8 92.4 92.3 93.5 93.0  PLT 281   < > 278 245 220 292 322   < > = values in this interval not displayed.   Basic Metabolic Panel: Recent Labs  Lab 08/27/19 1815 08/28/19 0407 08/30/19 0606 08/31/19 0826 09/01/19 0357  NA 138 139 139 137 138  K 3.7 3.5 3.9 3.6 3.5  CL 102 105 104 102 104  CO2 25 25 25 26 23   GLUCOSE 129* 114* 126* 192* 129*  BUN 15 14 13 15 17   CREATININE 1.35* 1.44* 1.42* 1.54* 1.44*  CALCIUM 8.9 8.9 9.0 9.4 9.0  MG 1.8 2.0 2.0 2.0  --   PHOS 3.6 3.5 3.0 2.8  --    GFR: Estimated Creatinine Clearance: 60.7 mL/min (A) (by C-G formula based on SCr of 1.44 mg/dL (H)). Liver Function Tests: Recent Labs  Lab 08/26/19 0658 08/27/19 1815 08/28/19 0407 08/30/19 0606 08/31/19 0826  AST 23 24 21 30 31   ALT 22 23 19 29 31   ALKPHOS 106 122 112 121 134*  BILITOT 0.8 0.8 0.8 0.9 1.4*  PROT 7.4 6.8 6.3* 7.2 7.3  ALBUMIN 3.4* 3.2* 3.0* 3.5 3.5   No results for input(s):  LIPASE, AMYLASE in the last 168 hours. No results for input(s): AMMONIA in the last 168 hours. Coagulation Profile: Recent Labs  Lab 08/26/19 0658 08/29/19 1825  INR 1.0 1.1   Cardiac Enzymes: No results for input(s): CKTOTAL, CKMB, CKMBINDEX, TROPONINI in the last 168 hours. BNP (last 3 results) No results for input(s): PROBNP in the last 8760 hours. HbA1C: No results for input(s): HGBA1C in the last 72 hours. CBG: Recent Labs  Lab 08/31/19 1955 09/01/19 0016 09/01/19 0339 09/01/19 0820 09/01/19 1140  GLUCAP 168*  124* 121* 119* 124*   Lipid Profile: No results for input(s): CHOL, HDL, LDLCALC, TRIG, CHOLHDL, LDLDIRECT in the last 72 hours. Thyroid Function Tests: No results for input(s): TSH, T4TOTAL, FREET4, T3FREE, THYROIDAB in the last 72 hours. Anemia Panel: No results for input(s): VITAMINB12, FOLATE, FERRITIN, TIBC, IRON, RETICCTPCT in the last 72 hours.    Radiology Studies: I have reviewed all of the imaging during this hospital visit personally     Scheduled Meds: . [MAR Hold] aspirin EC  81 mg Oral Daily  . [MAR Hold] atorvastatin  80 mg Oral q1800  . eptifibatide      . [MAR Hold] febuxostat  40 mg Oral Daily  . [MAR Hold] FLUoxetine  10 mg Oral Daily  . [MAR Hold] insulin aspart  0-6 Units Subcutaneous Q4H  . [MAR Hold] multivitamin with minerals  1 tablet Oral Daily  . nitroGLYCERIN      . [MAR Hold] sodium chloride flush  3 mL Intravenous Once  . [MAR Hold] ticagrelor  90 mg Oral BID   Continuous Infusions: . sodium chloride Stopped (08/27/19 0042)  . sodium chloride 50 mL/hr at 08/30/19 1500  . ceFAZolin    . clevidipine    . heparin    . heparin    . lactated ringers 10 mL/hr at 09/01/19 0942     LOS: 5 days        Angenette Daily Annett Gula, MD

## 2019-09-01 NOTE — Transfer of Care (Signed)
Immediate Anesthesia Transfer of Care Note  Patient: [name]  Procedure(s) Performed: IR WITH ANESTHESIA BASILAR ARTERY STENTING (N/A )  Patient Location: PACU  Anesthesia Type:General  Level of Consciousness: awake, alert  and oriented  Airway & Oxygen Therapy: Patient Spontanous Breathing and Patient connected to face mask oxygen  Post-op Assessment: Report given to RN and Post -op Vital signs reviewed and stable  Post vital signs: Reviewed and stable  Last Vitals:  Vitals Value Taken Time  BP 117/77 09/01/19 1541  Temp    Pulse 88 09/01/19 1545  Resp 20 09/01/19 1545  SpO2 92 % 09/01/19 1545  Vitals shown include unvalidated device data.  Last Pain:  Vitals:   09/01/19 1010  TempSrc:   PainSc: 8          Complications: No complications documented.

## 2019-09-01 NOTE — Progress Notes (Signed)
OT Cancellation Note  Patient Details Name: [name] MRN: [id number] DOB: October 31, 1950   Cancelled Treatment:    Reason Eval/Treat Not Completed: Patient at procedure or test/ unavailable (Pt OTF For surgery. Will continue to follow as available and appropriate.)  Dalphine Handing, MSOT, OTR/L Acute Rehabilitation Services [facility] Office Number: [phone] Pager: [phone]  Dalphine Handing 09/01/2019, 10:12 AM

## 2019-09-01 NOTE — Anesthesia Postprocedure Evaluation (Signed)
Anesthesia Post Note  Patient: Mathis Cashman  Procedure(s) Performed: IR WITH ANESTHESIA BASILAR ARTERY STENTING (N/A )     Patient location during evaluation: PACU Anesthesia Type: General Level of consciousness: awake Pain management: pain level controlled Vital Signs Assessment: post-procedure vital signs reviewed and stable Respiratory status: spontaneous breathing Cardiovascular status: stable Postop Assessment: no apparent nausea or vomiting Anesthetic complications: no   No complications documented.  Last Vitals:  Vitals:   09/01/19 1655 09/01/19 1707  BP: (!) 157/118 (!) 148/80  Pulse: (!) 123 99  Resp: 16 (!) 24  Temp:    SpO2: 98% 97%    Last Pain:  Vitals:   09/01/19 1640  TempSrc:   PainSc: 0-No pain                 Janard Culp

## 2019-09-01 NOTE — Progress Notes (Signed)
Groin assessed with pacu RN at bedside

## 2019-09-01 NOTE — Progress Notes (Signed)
Patient is off the floor to the OR. Report was called to the OR. Wife is at the bed side with the patient.

## 2019-09-01 NOTE — Progress Notes (Addendum)
STROKE TEAM PROGRESS NOTE   INTERVAL HISTORY Pt was seen at PACU and in ICU. Pt had VBJ angioplasty and stenting this pm, extubated postop, right groin sheath off with manual compression, hemodynamically stable. Later in PACU developed agitation and combative but still awake alert moving all extremities. Was put on soft wrist restrain and right leg restrain. Received ativan 1mg  + 2mg , precedex bolus and infusion.     OBJECTIVE Vitals:   08/31/19 2349 08/31/19 2349 09/01/19 0342 09/01/19 0821  BP: 116/79  127/78 (!) 145/87  Pulse: 79  94 94  Resp: 21  14 15   Temp:  98.4 F (36.9 C) 98.6 F (37 C) 98.1 F (36.7 C)  TempSrc:   Oral Oral  SpO2: 99%  99% 100%  Weight:      Height:       CBC:  Recent Labs  Lab 08/30/19 0606 08/31/19 0826  WBC 8.1 7.2  NEUTROABS 5.5 5.3  HGB 14.1 14.3  HCT 41.4 41.2  MCV 93.5 93.0  PLT 292 322   Basic Metabolic Panel:  Recent Labs  Lab 08/30/19 0606 08/30/19 0606 08/31/19 0826 09/01/19 0357  NA 139   < > 137 138  K 3.9   < > 3.6 3.5  CL 104   < > 102 104  CO2 25   < > 26 23  GLUCOSE 126*   < > 192* 129*  BUN 13   < > 15 17  CREATININE 1.42*   < > 1.54* 1.44*  CALCIUM 9.0   < > 9.4 9.0  MG 2.0  --  2.0  --   PHOS 3.0  --  2.8  --    < > = values in this interval not displayed.   Lipid Panel:     Component Value Date/Time   CHOL 96 08/27/2019 1815   TRIG 96 08/27/2019 1815   HDL 22 (L) 08/27/2019 1815   CHOLHDL 4.4 08/27/2019 1815   VLDL 19 08/27/2019 1815   LDLCALC 55 08/27/2019 1815   HgbA1c:  Lab Results  Component Value Date   HGBA1C 6.2 (H) 08/27/2019   Urine Drug Screen: No results found for: LABOPIA, COCAINSCRNUR, LABBENZ, AMPHETMU, THCU, LABBARB  Alcohol Level No results found for: [facility]  IMAGING  CT HEAD WO CONTRAST 08/26/2019 1. No acute intracranial abnormality. Signs of prior infarct, atrophy and chronic microvascular ischemic change.  2. Infarcts exhibited recent MRI not well seen on today's study with  evidence of remote infarct in the bilateral occipital poles but without signs of acute intracranial process.  3. Bilateral maxillary sinus disease, new since previous imaging worse on the RIGHT.    MR BRAIN WO CONTRAST 08/26/2019 Acute infarcts involving the bilateral occipital lobes and anterior left pons. Remote infarcts involving the bilateral occipital lobes, bilateral cerebellum, dorsal left midbrain, bilateral centrum semiovale, bilateral thalami and left frontal white matter. 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 2.Oclluded v severe stenosis of non dominant RT VBJ marred by motion artifact. 3.Approx 50 % stenosis LT MCA M 1 seg.  ECHOCARDIOGRAM COMPLETE 08/27/2019 1. Left ventricular ejection fraction, by estimation, is 45 to 50%. The left ventricle has mildly decreased function. The left ventricle demonstrates global hypokinesis. There is mild concentric left ventricular hypertrophy. Left ventricular diastolic parameters are indeterminate.  2. Right ventricular systolic function is normal. The right ventricular size  is normal.  3. The mitral valve is normal in structure. Trivial mitral valve regurgitation. No evidence of mitral stenosis.  4. The aortic valve is tricuspid. Aortic valve regurgitation is mild.  5. The inferior vena cava is normal in size with greater than 50% respiratory variability, suggesting right atrial pressure of 3 mmHg. Conclusion(s)/Recommendation(s): No intracardiac source of embolism detected on this transthoracic study.  Interventional Radiology - Cerebral Angiogram + Stent 09/01/2019 pending   ECG - SR rate 83 BPM. (See cardiology reading for complete details)   PHYSICAL EXAM    Temp:  [98 F (36.7 C)-98.6 F (37 C)] 98.1 F (36.7 C) (07/29 0821) Pulse Rate:  [72-94] 94 (07/29 0821) Resp:   [14-21] 15 (07/29 0821) BP: (112-145)/(78-87) 145/87 (07/29 0821) SpO2:  [96 %-100 %] 100 % (07/29 0821)  General - Well nourished, well developed, agitated, combative.  Ophthalmologic - fundi not visualized due to noncooperation.  Cardiovascular - Regular rhythm and rate.  Neuro - awake, alert, but agitated and combative, orientated to self and people, but not orientated to time or place.  Moderate dysarthria.  Follows simple commands. Blinking to visual threat bilaterally.  Extraocular movement grossly intact, tracking bilaterally.  Right nasolabial fold flattening, tongue midline.  Right upper extremity 4+/5 with pronator drift.  Right upper extremity and bilateral lower extremity 5/5. Coordination not cooperative.  Sensation symmetrical.  Gait not tested.   ASSESSMENT/PLAN [name] is a 69 y.o. male with history of HLD (hyperlipidemia), MI (myocardial infarction) (HCC), and prior Strokes (HCC) who presents with dysarthria.  He did not receive IV t-PA.  Strokes: Acute infarcts involving the bilateral occipital lobes and anterior left pons - large vessel disease due to VA stenosis/occlusion  CT head -  No acute intracranial abnormality  MRI head - Acute infarcts involving the bilateral occipital lobes and anterior left pons. Remote infarcts involving the bilateral occipital lobes, bilateral cerebellum, dorsal left midbrain, bilateral centrum semiovale, bilateral thalami and left frontal white matter. 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   CTA head and neck - right V4 occluded. Left VBJ high grade stenosis, BA patent  Cerebral angio - Left VBJ 95% stenosis. Right VA distal occlusion.  2D Echo - EF 45-50%  Sars Corona Virus 2 - negative  LDL - 55  HgbA1c 6.2  P2Y12 39  VTE prophylaxis - Vergas Heparin  aspirin 81 mg daily and clopidogrel 75 mg daily prior to admission, now on aspirin 81 mg daily and Brilinta (ticagrelor) 90 mg bid. Heparin  IV 24h post stent.  Patient counseled to be compliant with his antithrombotic medications  Ongoing aggressive stroke risk factor management  Therapy recommendations:  HH PT, HH OT, HH SLP  Disposition:  Pending  Recurrent posterior infarcts  05/2019 MRI bilateral occipital PCA infarcts, CTA head and neck positive for Severe stenosis of the bilateral VBJ, and moderate to severe stenoses of both proximal Vertebral Arteries.  60% stenosis Left ICA bulb due to soft plaque. Moderate bilateral. ICA siphon stenosis due to bulky plaque. Mild left MCA M1 stenosis. Mild to moderate ACA A2 stenoses.  07/2019 Multiple acute infarcts of the cerebellumand occipital lobes  bilaterally as well as the left thalamus and posterior left mid brain. CTA H&N- June 2021- severe bilateral vertebrobasilar junction stenosis and advanced atherosclerotic changes elsewhere in the head and neck including moderate to severe bilateral vertebral artery origin stenosis, 60% stenosis left ICA bulb.  cerebral angio Left VBJ 95% stenosis. Right VA  distal occlusion.  On aspirin 81 and Brilinta 90 bid  P2Y12 39  VBJ stent placed 7/29 by Dr. Corliss Skains   Agitation post op  Mild delirium   S/p ativan 1mg  + 2mg   precedex bolus and IV infusion  Soft restrain  Close monitoring - consider CT brain if needed  Hypertension  Home BP meds: Norvasc ; Coreg  Current BP meds: None . BP goal 120-140 for 24h post op . On cleviprex PRN . Long term BP goal normotensive  Hyperlipidemia  Home Lipid lowering medication: Lipitor 80 mg daily  LDL - 55 goal < 70  Current lipid lowering medication: Lipitor 80 mg daily   Continue statin at discharge  DM type II, controlled  HgbA1c 6.2  No home meds  Very sensitive SSI  PCP follow up  Acute urinary retention  Large volume urine bladder scan  Hematuria  S/p I&O  Will put on foley catheter for hematuria monitoring  Other Stroke Risk Factors  Advanced  age  Previous ETOH use.  Obesity, Body mass index is 34.44 kg/m., recommend weight loss, diet and exercise as appropriate   Coronary artery disease  Other Active Problems  Code status - Full code CKD - stage 3a - cre 1.56->1.60->1.47->1.35->1.44->1.42->1.54->1.44  Palliative Care Consult - 08/28/19 - Full code. Wife reviewing literature. OP f/u.  Hospital day # 5  This patient is critically ill due to basilar artery stenosis s/p stenting, post op agitation delirium, recurrent posterior infarcts, acute urinary retention and at significant risk of neurological worsening, death form recurrent stroke, basilar stent occlusion, hemorrhage from heparin IV, agitation, seizure. This patient's care requires constant monitoring of vital signs, hemodynamics, respiratory and cardiac monitoring, review of multiple databases, neurological assessment, discussion with family, other specialists and medical decision making of high complexity. I spent 40 minutes of neurocritical care time in the care of this patient. I had long discussion with wife and son at bedside, updated pt current condition, treatment plan and potential prognosis, and answered all the questions. They expressed understanding and appreciation. I also discussed with Dr. .   08/30/19, MD PhD Stroke Neurology 09/01/2019 11:40 AM   To contact Stroke Continuity provider, please refer to [facility] Plan. After hours, contact General Neurology

## 2019-09-01 NOTE — Progress Notes (Signed)
ANTICOAGULATION CONSULT NOTE   Pharmacy Consult for IV Heparin Indication: Post-IR Procedure  No Known Allergies  Patient Measurements: Height: 5\' 10"  (177.8 cm) Weight: (!) 108.9 kg (240 lb) IBW/kg (Calculated) : 73 Heparin Dosing Weight: 96.5 kg  Vital Signs: Temp: 99.4 F (37.4 C) (07/29 2000) Temp Source: Oral (07/29 2000) BP: 110/56 (07/29 2200) Pulse Rate: 68 (07/29 2200)  Labs: Recent Labs    08/30/19 0606 08/31/19 0826 09/01/19 0357 09/01/19 2211  HGB 14.1 14.3  --   --   HCT 41.4 41.2  --   --   PLT 292 322  --   --   HEPARINUNFRC  --   --   --  0.31  CREATININE 1.42* 1.54* 1.44*  --     Estimated Creatinine Clearance: 60.7 mL/min (A) (by C-G formula based on SCr of 1.44 mg/dL (H)).  Assessment: 69 yr old male S/P angioplasty of L VBJ for heparin.  Goal of Therapy:  Heparin level: 0.1-0.25 units/ml Monitor platelets by anticoagulation protocol: Yes   Plan:  Decrease Heparin 650 units/hr  03-26-1974, PharmD, BCPS  09/01/2019,11:37 PM

## 2019-09-01 NOTE — Procedures (Signed)
S/P LT VA arteriogram followed by stent assisted angioplasty of near occlusive dominant Lt VBJ  with TICI 3 revascularization. Extubated.  Denies any H/As or nausea.  Moves all 4s to command RT side less so than the left. RT groin soft. Distal pulses all dopplerable. Pupils 74mm RT = LT . S.Rameses Ou MD.

## 2019-09-01 NOTE — Anesthesia Procedure Notes (Signed)
Procedure Name: Intubation Date/Time: 09/01/2019 12:12 PM Performed by: Gaylene Brooks, CRNA Pre-anesthesia Checklist: Patient identified, Emergency Drugs available, Suction available and Patient being monitored Patient Re-evaluated:Patient Re-evaluated prior to induction Oxygen Delivery Method: Circle System Utilized Preoxygenation: Pre-oxygenation with 100% oxygen Induction Type: IV induction and Rapid sequence Laryngoscope Size: Mac and 4 Grade View: Grade I Tube type: Oral Tube size: 7.5 mm Number of attempts: 1 Airway Equipment and Method: Stylet Placement Confirmation: ETT inserted through vocal cords under direct vision,  positive ETCO2 and breath sounds checked- equal and bilateral Secured at: 25 cm Tube secured with: Tape Dental Injury: Teeth and Oropharynx as per pre-operative assessment

## 2019-09-01 NOTE — Progress Notes (Addendum)
Pt came to ICU around 1745.  Pt was very agitated and combative, taking several RNs to hold pt down.  Pt was placed in 4 point restraints and 2mg  Ativan given.  Dr. at bedside.  Orders for precedex gtt were also given and started.   Bladder scan performed on pt revealing over 400cc of urine.  Orders to I&O cath pt.  Bright red blood and clots present in I&O cath.  Dr. Roda Shutters aware.  Orders to place foley at this time.   Pt now resting comfortably with periods of apnea.  Precedex was turned off just over 30 minutes ago and pt was placed on 4L [place].

## 2019-09-01 NOTE — Anesthesia Procedure Notes (Signed)
Arterial Line Insertion Start/End7/29/2021 11:00 AM, 09/01/2019 11:15 AM Performed by: Mayer Camel, CRNA, CRNA  Preanesthetic checklist: patient identified, IV checked, site marked, risks and benefits discussed, surgical consent, monitors and equipment checked, pre-op evaluation, timeout performed and anesthesia consent Lidocaine 1% used for infiltration Left, radial was placed Catheter size: 20 G Hand hygiene performed  and maximum sterile barriers used   Attempts: 1 Procedure performed without using ultrasound guided technique. Following insertion, dressing applied and Biopatch. Post procedure assessment: normal

## 2019-09-01 NOTE — Progress Notes (Signed)
PT Cancellation Note  Patient Details Name: [name] MRN: [id number] DOB: Aug 11, 1950   Cancelled Treatment:    Reason Eval/Treat Not Completed: Patient at procedure or test/unavailable. Pt off unit for stent placement, PT will follow up when pt back on floor and medically appropriate to mobilize.   Arlyss Gandy 09/01/2019, 4:02 PM

## 2019-09-01 NOTE — Progress Notes (Signed)
PA to short stay with Dr. Corliss Skains after procedure this afternoon.   Patient belligerent, yelling, spitting.  He is following commands when not agitated.  Able to answer some orientation questions, but does not know where he is or the events of the day.  No focal deficits.  He is moving all extremities, denies blurry vision, able to distinguish color, no facial droop.   Appears to be encephalopathic possibly related to anesthesia.  Patient placed in restraints to protect lines and groin site.  Also attempting to hit staff.  Wife brought back to [facility].  Patient recognizes and talks to her appropriately.   Given 1mg  ativan with no effect.  Appreciate Dr. assistance to calm patient.   Dr. Chilton Si aware of patient transfer to ICU for overnight observation.   Ella Jubilee, MS RD PA-C 5:20 PM

## 2019-09-01 NOTE — Progress Notes (Addendum)
ANTICOAGULATION CONSULT NOTE - Initial Consult  Pharmacy Consult for IV Heparin Indication: Post-IR Procedure  No Known Allergies  Patient Measurements: Height: 5\' 10"  (177.8 cm) Weight: (!) 108.9 kg (240 lb) IBW/kg (Calculated) : 73 Heparin Dosing Weight: 96.5 kg  Vital Signs: Temp: 98.1 F (36.7 C) (07/29 0821) Temp Source: Oral (07/29 0821) BP: 145/87 (07/29 0821) Pulse Rate: 94 (07/29 0821)  Labs: Recent Labs    08/29/19 1825 08/30/19 0606 08/31/19 0826 09/01/19 0357  HGB  --  14.1 14.3  --   HCT  --  41.4 41.2  --   PLT  --  292 322  --   LABPROT 13.5  --   --   --   INR 1.1  --   --   --   CREATININE  --  1.42* 1.54* 1.44*    Estimated Creatinine Clearance: 60.7 mL/min (A) (by C-G formula based on SCr of 1.44 mg/dL (H)).   Medical History: Past Medical History:  Diagnosis Date  . HLD (hyperlipidemia)   . MI (myocardial infarction) (HCC)   . Stroke [facility])     Assessment: 69 yr old male, S/P L VA arteriogram, followed by stent-assisted angioplasty of near occlusive dominant L VBJ with TICI 3 revascularization this afternoon. Pt was started on heparin infusion at 500 units/hr in PACU. Pharmacy is consulted to dose/monitor heparin. Pt was on no anticoagulant PTA (was on Plavix).  CBC WNL. Per PACU RN, no issues with bleeding post procedure (except when pt pulled out a-line).  Goal of Therapy:  Heparin level: 0.1-0.25 units/ml Monitor platelets by anticoagulation protocol: Yes   Plan:  Increase heparin infusion to 750 units/hr (~8 units/kg/hr) Check heparin level in 6 hrs Monitor daily heparin level, CBC Monitor for signs/symptoms of bleeding Heparin order to be discontinued at 0700 tomorrow (Friday, 7/30)  8/30, PharmD, BCPS, [facility] Clinical Pharmacist 09/01/2019,4:00 PM

## 2019-09-01 NOTE — Progress Notes (Signed)
Spoke with Dr. Wilford Corner regarding antiplatelet therapy in context of npo status, lack of tube access and bleeding from indwelling urinary catheter.  Orders received.

## 2019-09-02 ENCOUNTER — Encounter (HOSPITAL_COMMUNITY): Payer: Self-pay | Admitting: Interventional Radiology

## 2019-09-02 DIAGNOSIS — N1831 Chronic kidney disease, stage 3a: Secondary | ICD-10-CM | POA: Diagnosis not present

## 2019-09-02 DIAGNOSIS — R410 Disorientation, unspecified: Secondary | ICD-10-CM | POA: Diagnosis not present

## 2019-09-02 DIAGNOSIS — R471 Dysarthria and anarthria: Secondary | ICD-10-CM | POA: Diagnosis not present

## 2019-09-02 DIAGNOSIS — I63219 Cerebral infarction due to unspecified occlusion or stenosis of unspecified vertebral arteries: Secondary | ICD-10-CM | POA: Diagnosis not present

## 2019-09-02 DIAGNOSIS — I6322 Cerebral infarction due to unspecified occlusion or stenosis of basilar arteries: Secondary | ICD-10-CM | POA: Diagnosis not present

## 2019-09-02 DIAGNOSIS — I651 Occlusion and stenosis of basilar artery: Secondary | ICD-10-CM | POA: Diagnosis not present

## 2019-09-02 DIAGNOSIS — E785 Hyperlipidemia, unspecified: Secondary | ICD-10-CM | POA: Diagnosis not present

## 2019-09-02 DIAGNOSIS — E1159 Type 2 diabetes mellitus with other circulatory complications: Secondary | ICD-10-CM | POA: Diagnosis not present

## 2019-09-02 LAB — CBC WITH DIFFERENTIAL/PLATELET
Abs Immature Granulocytes: 0.06 10*3/uL (ref 0.00–0.07)
Basophils Absolute: 0 10*3/uL (ref 0.0–0.1)
Basophils Relative: 0 %
Eosinophils Absolute: 0 10*3/uL (ref 0.0–0.5)
Eosinophils Relative: 0 %
HCT: 34.4 % — ABNORMAL LOW (ref 39.0–52.0)
Hemoglobin: 11.7 g/dL — ABNORMAL LOW (ref 13.0–17.0)
Immature Granulocytes: 1 %
Lymphocytes Relative: 8 %
Lymphs Abs: 1 10*3/uL (ref 0.7–4.0)
MCH: 31.9 pg (ref 26.0–34.0)
MCHC: 34 g/dL (ref 30.0–36.0)
MCV: 93.7 fL (ref 80.0–100.0)
Monocytes Absolute: 0.8 10*3/uL (ref 0.1–1.0)
Monocytes Relative: 6 %
Neutro Abs: 10.1 10*3/uL — ABNORMAL HIGH (ref 1.7–7.7)
Neutrophils Relative %: 85 %
Platelets: 276 10*3/uL (ref 150–400)
RBC: 3.67 MIL/uL — ABNORMAL LOW (ref 4.22–5.81)
RDW: 13.2 % (ref 11.5–15.5)
WBC: 11.9 10*3/uL — ABNORMAL HIGH (ref 4.0–10.5)
nRBC: 0 % (ref 0.0–0.2)

## 2019-09-02 LAB — BASIC METABOLIC PANEL WITH GFR
Anion gap: 9 (ref 5–15)
BUN: 17 mg/dL (ref 8–23)
CO2: 21 mmol/L — ABNORMAL LOW (ref 22–32)
Calcium: 8.8 mg/dL — ABNORMAL LOW (ref 8.9–10.3)
Chloride: 108 mmol/L (ref 98–111)
Creatinine, Ser: 1.45 mg/dL — ABNORMAL HIGH (ref 0.61–1.24)
GFR calc Af Amer: 57 mL/min — ABNORMAL LOW
GFR calc non Af Amer: 49 mL/min — ABNORMAL LOW
Glucose, Bld: 144 mg/dL — ABNORMAL HIGH (ref 70–99)
Potassium: 3.5 mmol/L (ref 3.5–5.1)
Sodium: 138 mmol/L (ref 135–145)

## 2019-09-02 LAB — GLUCOSE, CAPILLARY
Glucose-Capillary: 138 mg/dL — ABNORMAL HIGH (ref 70–99)
Glucose-Capillary: 122 mg/dL — ABNORMAL HIGH (ref 70–99)
Glucose-Capillary: 122 mg/dL — ABNORMAL HIGH (ref 70–99)
Glucose-Capillary: 119 mg/dL — ABNORMAL HIGH (ref 70–99)
Glucose-Capillary: 143 mg/dL — ABNORMAL HIGH (ref 70–99)

## 2019-09-02 LAB — PLATELET INHIBITION P2Y12: Platelet Function  P2Y12: 30 [PRU] — ABNORMAL LOW (ref 182–335)

## 2019-09-02 MED ORDER — FLEET ENEMA 7-19 GM/118ML RE ENEM
1.0000 | ENEMA | Freq: Once | RECTAL | Status: AC
  Administered 2019-09-02: 1 via RECTAL
  Filled 2019-09-02: qty 1

## 2019-09-02 MED ORDER — BISACODYL 10 MG RE SUPP
10.0000 mg | Freq: Once | RECTAL | Status: DC
  Filled 2019-09-02: qty 1

## 2019-09-02 NOTE — TOC Benefit Eligibility Note (Signed)
Transition of Care [facility]) Benefit Eligibility Note    Patient Details  Name: [name] MRN: [id number] Date of Birth: 1950/04/17   Medication/Dose: Marden Noble  Covered?: Yes     Prescription Coverage Preferred Pharmacy: [facility]  Spoke with Person/Company/Phone Number:: [employer]  Co-Pay: $45 for 30 day retail/ $125 for 90 day mail order  Prior Approval: No          Orson Aloe Phone Number: 09/02/2019, 4:36 PM

## 2019-09-02 NOTE — Progress Notes (Signed)
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 Corliss Skains  Subjective:  Patient seen on the floor, no visitors present this morning, RN at bedside. He is asking multiple times for water. Able to follow commands, per RN oriented except stating "March" is month. Hematuria with clots noted overnight but clearing this AM after flushing. Precedex stopped, heparin gtt continued.Patient unable to swallow yesterday and concern for bleeding due to dried blood in mouth so NG placement is on hold currently, swallow evaluation is pending.   Allergies: Patient has no known allergies.  Medications: Prior to Admission medications   Medication Sig Start Date End Date Taking? Authorizing Provider  amLODipine (NORVASC) 2.5 MG tablet Take 2.5 mg by mouth daily. 08/17/19  Yes [provider]  aspirin EC 81 MG tablet Take 81 mg by mouth daily. Swallow whole.   Yes [provider]  atorvastatin (LIPITOR) 80 MG tablet Take 80 mg by mouth daily at 6 PM.  08/17/19  Yes [provider]  carvedilol (COREG) 3.125 MG tablet Take 3.125 mg by mouth 2 (two) times daily. 07/07/19  Yes [provider]  clopidogrel (PLAVIX) 75 MG tablet Take 75 mg by mouth daily. 08/17/19  Yes [provider]  febuxostat (ULORIC) 40 MG tablet Take 40 mg by mouth daily. 08/17/19  Yes [provider]  FLUoxetine (PROZAC) 10 MG capsule Take 10 mg by mouth daily. 08/17/19  Yes [provider]  Misc Natural Products (TART CHERRY ADVANCED PO) Take 1,200 mg by mouth daily.   Yes [provider]  MITIGARE 0.6 MG CAPS Take 0.6-1.2 mg by mouth 2 (two) times daily as needed (gout flareup).  06/10/19  Yes [provider]  Multiple Vitamins-Minerals (ONE-A-DAY MENS 50+ PO) Take  1 tablet by mouth daily.   Yes [provider]     Vital Signs: BP 116/67   Pulse 64   Temp 99.4 F (37.4 C) (Oral)   Resp 12   Ht 5\' 10"  (1.778 m)   Wt (!) 240 lb (108.9 kg)   SpO2 100%   BMI 34.44 kg/m   Physical Exam Vitals and nursing note reviewed.  Constitutional:      General: He is not in acute distress. HENT:     Head: Normocephalic.  Cardiovascular:     Rate and Rhythm: Normal rate.  Pulmonary:     Effort: Pulmonary effort is normal.  Skin:    General: Skin is warm and dry.  Neurological:     Mental Status: He is alert.   Alert, awake, and oriented to person and "hospital." Speech slowed but at baseline and comprehension in tact, follows commands appropriately. PERRL bilaterally EOMs without obvious nystagmus. Visual fields - peripheral visual fields not intact (baseline), able to state "blue" for hand color with glove on and states "3" when holding up 4 fingers, "4" when holding up 5 fingers when holding hand directly in front of visual field. No obviosu facial asymmetry. Tongue midline Motor power full - moves all 4 extremities equally Pronator drift - not assessed. Fine motor and coordination - grossly in tact. Gait - not assessed. Romberg - not assessed. Heel to toe - not assessed. Distal pulses - dopplerable bilaterally. Right CFA puncture site clean, dry, dressed appropriately - soft without active bleeding or drainage, non pulsatile.  Imaging: IR US Guide Vasc Access Right  Result Date: 08/31/2019 CLINICAL DATA:  Vertebrobasilar ischemic symptoms. 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 EXAM: BILATERAL COMMON CAROTID AND INNOMINATE ANGIOGRAPHY COMPARISON:  CT angiogram of the head and neck of August 27, 2019. MEDICATIONS: Heparin 2000 units IA. No antibiotic was administered within 1 hour of the procedure. ANESTHESIA/SEDATION: Versed 1 mg IV; Fentanyl 25 mcg IV  Moderate Sedation Time:  47 minutes The patient was continuously monitored during the procedure by the interventional radiology nurse under my direct supervision. CONTRAST:  Isovue 300 approximately 70 cc FLUOROSCOPY TIME:  Fluoroscopy Time: 12 minutes 48 seconds (1151 mGy). COMPLICATIONS: None immediate. TECHNIQUE: Informed written consent was obtained from the patient after a thorough discussion of the procedural risks, benefits and alternatives. All questions were addressed. Maximal Sterile Barrier Technique was utilized including caps, mask, sterile gowns, sterile gloves, sterile drape, hand hygiene and skin antiseptic. A timeout was performed prior to the initiation of the procedure. The right forearm to the wrist was prepped and draped in the usual sterile manner. The right radial artery was then identified with ultrasound, and its morphology documented. A dorsal palmar anastomosis was verified to be present. Using a micropuncture set and ultrasound guidance, access into the right radial artery was obtained over a 0.018 inch micro guidewire. A 4/5 French radial sheath was then inserted. The obturator, and the micro guidewire were removed. Good aspiration obtained from the side port of the sheath. A cocktail of 2000 units of heparin, 2.5 mg of verapamil, and 200 mcg of nitroglycerin was then infused in diluted form through the sheath. A right radial arteriogram was then performed. A 5 French Simmons 2 diagnostic catheter was then advanced to the aortic arch region, and select cannulation was performed of the left vertebral artery, the right common carotid artery, the left common carotid artery, and just proximal to the origin of the right vertebral artery. Following the procedure, hemostasis at the right radial puncture site was achieved with a wrist band. Distal right radial pulse was verified to be present. FINDINGS: The origin of the right radial artery appears to be moderately stenosed. More distally the  vessel is seen to ascend to the cranial skull base with opacification of the right posterior-inferior cerebellar artery and the vertebrobasilar junction just distal to this. No contrast is seen distal to this in the basilar artery. The dominant left vertebral artery origin is widely patent. The vessel has a mild to moderate tortuosity just distal to its origin. More distally the vessel is seen to opacify to the cranial skull base. Wide patency is seen of the left vertebrobasilar junction proximal to the left posterior-inferior cerebellar artery. A severe high-grade stenosis is seen at the distal left vertebrobasilar junction, at its confluence with the basilar artery. Distal to the basilar artery the posterior cerebral arteries, the superior cerebellar arteries and the anterior-inferior cerebellar arteries opacify into the capillary and venous phases. Scattered focal areas of mild caliber irregularity is seen involving the proximal right PCA, the superior cerebellar arteries and the left posterior-inferior cerebellar artery. The left common carotid arteriogram demonstrates mild stenosis at the origin of the left external carotid artery. Its branches opacify widely. The left internal carotid artery at the bulb appears to have a moderate-sized ulcerated plaque associated with mild stenosis. No evidence of intraluminal filling defects is seen. More distally the vessel is seen to opacify to the cranial skull base. There  is mild stenosis at the petrous cavernous junction, and the caval cavernous region. More distally the cavernous and the supraclinoid segments are widely patent. The left middle cerebral artery and the left anterior cerebral artery opacify into the capillary and venous phases. There is approximately 50% stenosis of the proximal middle cerebral artery in the M1 segment. The right common carotid arteriogram demonstrates the right external carotid artery and its major branches to be widely patent. The right  internal carotid artery at the bulb has a small ulcerated plaque along the posterior wall. No evidence of significant stenosis is seen. More distally the vessel is seen to opacify to the cranial skull base. There is mild narrowing of the petrous cavernous junction. More distally the cavernous and the supraclinoid segments are widely patent. The right middle cerebral artery and the right anterior cerebral artery opacify into the capillary and venous phases. Mild stenosis of the right anterior cerebral A1 segment at its origin is noticeable. 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 PLAN: Angiographic findings reviewed with the patient and his family, and with the stroke neurologist. Electronically Signed   By: Julieanne CottonSanjeev  Deveshwar M.D.   On: 08/30/2019 10:09   DG Swallowing Func-Speech Pathology  Result Date: 08/31/2019 Objective Swallowing Evaluation: Type of Study: MBS-Modified Barium Swallow Study  Patient Details Name: [name] MRN: [id number] Date of Birth: 20-Oct-1950 Today's Date: 08/31/2019 Time: SLP Start Time (ACUTE ONLY): 1111 -SLP Stop Time (ACUTE ONLY): 1124 SLP Time Calculation (min) (ACUTE ONLY): 13 min Past Medical History: Past Medical History: Diagnosis Date . HLD (hyperlipidemia)  . MI (myocardial infarction) (HCC)  . Stroke [facility](HCC)  Past Surgical History: Past Surgical History: Procedure Laterality Date . CORONARY STENT INTERVENTION   . IR ANGIO INTRA EXTRACRAN SEL COM CAROTID INNOMINATE BILAT MOD SED  08/29/2019 . IR ANGIO VERTEBRAL SEL SUBCLAVIAN INNOMINATE UNI R MOD SED  08/29/2019 . IR ANGIO VERTEBRAL SEL VERTEBRAL UNI L MOD SED  08/29/2019 . 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 Pt was admitted back in April with CVA of PCA territory as well and then again on 6/27. Pt began to experience dysequilibrium, slurred speech and [place] [facility] [place] went to ED but too long of a wait so [facility].  Subjective: Pt was alert and cooperative Assessment / Plan / Recommendation CHL IP CLINICAL IMPRESSIONS 08/31/2019 Clinical Impression Pt presents with a mild oropharyngeal dysphagia, requiring increased time for oral preparation but with good oral control and clearance. He has mildly reduced laryngeal elevation and laryngeal vestibule closure that results in trace amounts of penetration to the vocal folds, not cleared spontaneously and only partially cleared with a cued throat clear and cough. At times, barium also seems to fall below the vocal folds in trace amounts. Occasional throat clearing was noted throughout testing but never associated with penetration or aspiration. His airway protection is improved with use of a chin tuck, but he does need Min-Mod cues to maintain the chin tuck until he has completed his swallow. Recommend continuing regular diet and thin liquids, using a chin tuck with all liquids.  SLP Visit Diagnosis Dysphagia, unspecified (R13.10) Attention and concentration deficit following -- Frontal lobe and executive function deficit following -- Impact on safety and function Mild aspiration risk   CHL IP TREATMENT RECOMMENDATION 08/31/2019 Treatment Recommendations Therapy as outlined in treatment plan below   Prognosis 08/31/2019 Prognosis for Safe Diet Advancement Good Barriers to Reach Goals Cognitive deficits Barriers/Prognosis Comment -- CHL IP DIET RECOMMENDATION 08/31/2019 SLP Diet Recommendations Regular solids;Thin liquid Liquid Administration via Cup;Straw Medication Administration Whole meds with puree  Compensations Minimize environmental distractions;Slow rate;Small sips/bites;Chin tuck Postural Changes Seated upright at 90 degrees   CHL IP OTHER RECOMMENDATIONS 08/31/2019 Recommended Consults -- Oral Care Recommendations Oral care BID Other Recommendations --   CHL IP FOLLOW UP RECOMMENDATIONS 08/31/2019 Follow up Recommendations Home health SLP;24 hour supervision/assistance   CHL IP FREQUENCY AND DURATION 08/31/2019 Speech Therapy Frequency (ACUTE ONLY) min 2x/week Treatment Duration 2 weeks      CHL IP ORAL PHASE 08/31/2019 Oral Phase WFL Oral - Pudding Teaspoon -- Oral - Pudding Cup -- Oral - Honey Teaspoon -- Oral - Honey Cup -- Oral - Nectar Teaspoon -- Oral - Nectar Cup -- Oral - Nectar Straw -- Oral - Thin Teaspoon -- Oral - Thin Cup -- Oral - Thin Straw -- Oral - Puree -- Oral - Mech Soft -- Oral - Regular -- Oral - Multi-Consistency -- Oral - Pill -- Oral Phase - Comment --  CHL IP PHARYNGEAL PHASE 08/31/2019 Pharyngeal Phase Impaired Pharyngeal- Pudding Teaspoon -- Pharyngeal -- Pharyngeal- Pudding Cup -- Pharyngeal -- Pharyngeal- Honey Teaspoon -- Pharyngeal -- Pharyngeal- Honey Cup -- Pharyngeal -- Pharyngeal- Nectar Teaspoon -- Pharyngeal -- Pharyngeal- Nectar Cup -- Pharyngeal -- Pharyngeal- Nectar Straw -- Pharyngeal -- Pharyngeal- Thin Teaspoon -- Pharyngeal -- Pharyngeal- Thin Cup Reduced laryngeal elevation;Reduced airway/laryngeal closure;Penetration/Aspiration during swallow Pharyngeal Material enters airway, CONTACTS cords and not ejected out Pharyngeal- Thin Straw Reduced laryngeal elevation;Reduced airway/laryngeal closure;Penetration/Aspiration during swallow Pharyngeal Material enters airway, passes BELOW cords without attempt by patient to eject out (silent aspiration) Pharyngeal- Puree WFL Pharyngeal -- Pharyngeal- Mechanical Soft WFL Pharyngeal -- Pharyngeal- Regular -- Pharyngeal -- Pharyngeal- Multi-consistency -- Pharyngeal -- Pharyngeal- Pill -- Pharyngeal -- Pharyngeal Comment --   CHL IP CERVICAL ESOPHAGEAL PHASE 08/31/2019 Cervical Esophageal Phase WFL Pudding Teaspoon -- Pudding Cup -- Honey Teaspoon -- Honey Cup -- Nectar Teaspoon -- Nectar Cup -- Nectar Straw -- Thin Teaspoon -- Thin Cup -- Thin Straw -- Puree -- Mechanical Soft -- Regular -- Multi-consistency -- Pill -- Cervical Esophageal Comment -- Mahala Menghini., M.A. CCC-SLP Acute Rehabilitation Services Pager [phone] Office [phone] 08/31/2019, 12:41 PM              IR ANGIO INTRA EXTRACRAN SEL COM CAROTID INNOMINATE BILAT MOD SED  Result Date: 08/31/2019 CLINICAL DATA:  Vertebrobasilar ischemic symptoms. 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 EXAM: BILATERAL COMMON CAROTID AND INNOMINATE ANGIOGRAPHY COMPARISON:  CT angiogram of the head and neck of August 27, 2019. MEDICATIONS: Heparin 2000 units IA. No antibiotic was administered within 1 hour of the procedure. ANESTHESIA/SEDATION: Versed 1 mg IV; Fentanyl 25 mcg IV Moderate Sedation Time:  47 minutes The patient was continuously monitored during the procedure by the interventional radiology nurse under my direct supervision. CONTRAST:  Isovue 300 approximately 70 cc FLUOROSCOPY TIME:  Fluoroscopy Time: 12 minutes 48 seconds (1151 mGy). COMPLICATIONS: None immediate. TECHNIQUE: Informed written consent was obtained from the patient after a thorough discussion of the procedural  risks, benefits and alternatives. All questions were addressed. Maximal Sterile Barrier Technique was utilized including caps, mask, sterile gowns, sterile gloves, sterile drape, hand hygiene and skin antiseptic. A timeout was performed prior to the initiation of the procedure. The right forearm to the wrist was prepped and draped in the usual sterile manner. The right radial artery was then identified with ultrasound, and its morphology documented. A dorsal palmar anastomosis was verified to be present. Using a  micropuncture set and ultrasound guidance, access into the right radial artery was obtained over a 0.018 inch micro guidewire. A 4/5 French radial sheath was then inserted. The obturator, and the micro guidewire were removed. Good aspiration obtained from the side port of the sheath. A cocktail of 2000 units of heparin, 2.5 mg of verapamil, and 200 mcg of nitroglycerin was then infused in diluted form through the sheath. A right radial arteriogram was then performed. A 5 French Simmons 2 diagnostic catheter was then advanced to the aortic arch region, and select cannulation was performed of the left vertebral artery, the right common carotid artery, the left common carotid artery, and just proximal to the origin of the right vertebral artery. Following the procedure, hemostasis at the right radial puncture site was achieved with a wrist band. Distal right radial pulse was verified to be present. FINDINGS: The origin of the right radial artery appears to be moderately stenosed. More distally the vessel is seen to ascend to the cranial skull base with opacification of the right posterior-inferior cerebellar artery and the vertebrobasilar junction just distal to this. No contrast is seen distal to this in the basilar artery. The dominant left vertebral artery origin is widely patent. The vessel has a mild to moderate tortuosity just distal to its origin. More distally the vessel is seen to opacify to the cranial skull base. Wide patency is seen of the left vertebrobasilar junction proximal to the left posterior-inferior cerebellar artery. A severe high-grade stenosis is seen at the distal left vertebrobasilar junction, at its confluence with the basilar artery. Distal to the basilar artery the posterior cerebral arteries, the superior cerebellar arteries and the anterior-inferior cerebellar arteries opacify into the capillary and venous phases. Scattered focal areas of mild caliber irregularity is seen involving the  proximal right PCA, the superior cerebellar arteries and the left posterior-inferior cerebellar artery. The left common carotid arteriogram demonstrates mild stenosis at the origin of the left external carotid artery. Its branches opacify widely. The left internal carotid artery at the bulb appears to have a moderate-sized ulcerated plaque associated with mild stenosis. No evidence of intraluminal filling defects is seen. More distally the vessel is seen to opacify to the cranial skull base. There is mild stenosis at the petrous cavernous junction, and the caval cavernous region. More distally the cavernous and the supraclinoid segments are widely patent. The left middle cerebral artery and the left anterior cerebral artery opacify into the capillary and venous phases. There is approximately 50% stenosis of the proximal middle cerebral artery in the M1 segment. The right common carotid arteriogram demonstrates the right external carotid artery and its major branches to be widely patent. The right internal carotid artery at the bulb has a small ulcerated plaque along the posterior wall. No evidence of significant stenosis is seen. More distally the vessel is seen to opacify to the cranial skull base. There is mild narrowing of the petrous cavernous junction. More distally the cavernous and the supraclinoid segments are widely patent. The right middle cerebral artery  and the right anterior cerebral artery opacify into the capillary and venous phases. Mild stenosis of the right anterior cerebral A1 segment at its origin is noticeable. 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 PLAN:  Angiographic findings reviewed with the patient and his family, and with the stroke neurologist. Electronically Signed   By: [name] M.D.   On: 08/30/2019 10:09   IR ANGIO VERTEBRAL SEL SUBCLAVIAN INNOMINATE UNI R MOD SED  Result Date: 08/31/2019 CLINICAL DATA:  Vertebrobasilar ischemic symptoms. 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 EXAM: BILATERAL COMMON CAROTID AND INNOMINATE ANGIOGRAPHY COMPARISON:  CT angiogram of the head and neck of August 27, 2019. MEDICATIONS: Heparin 2000 units IA. No antibiotic was administered within 1 hour of the procedure. ANESTHESIA/SEDATION: Versed 1 mg IV; Fentanyl 25 mcg IV Moderate Sedation Time:  47 minutes The patient was continuously monitored during the procedure by the interventional radiology nurse under my direct supervision. CONTRAST:  Isovue 300 approximately 70 cc FLUOROSCOPY TIME:  Fluoroscopy Time: 12 minutes 48 seconds (1151 mGy). COMPLICATIONS: None immediate. TECHNIQUE: Informed written consent was obtained from the patient after a thorough discussion of the procedural risks, benefits and alternatives. All questions were addressed. Maximal Sterile Barrier Technique was utilized including caps, mask, sterile gowns, sterile gloves, sterile drape, hand hygiene and skin antiseptic. A timeout was performed prior to the initiation of the procedure. The right forearm to the wrist was prepped and draped in the usual sterile manner. The right radial artery was then identified with ultrasound, and its morphology documented. A dorsal palmar anastomosis was verified to be present. Using a micropuncture set and ultrasound guidance, access into the right radial artery was obtained over a 0.018 inch micro guidewire. A 4/5 French radial sheath was then inserted. The obturator, and the micro guidewire were removed. Good aspiration obtained from the side port of the sheath. A cocktail of  2000 units of heparin, 2.5 mg of verapamil, and 200 mcg of nitroglycerin was then infused in diluted form through the sheath. A right radial arteriogram was then performed. A 5 French Simmons 2 diagnostic catheter was then advanced to the aortic arch region, and select cannulation was performed of the left vertebral artery, the right common carotid artery, the left common carotid artery, and just proximal to the origin of the right vertebral artery. Following the procedure, hemostasis at the right radial puncture site was achieved with a wrist band. Distal right radial pulse was verified to be present. FINDINGS: The origin of the right radial artery appears to be moderately stenosed. More distally the vessel is seen to ascend to the cranial skull base with opacification of the right posterior-inferior cerebellar artery and the vertebrobasilar junction just distal to this. No contrast is seen distal to this in the basilar artery. The dominant left vertebral artery origin is widely patent. The vessel has a mild to moderate tortuosity just distal to its origin. More distally the vessel is seen to opacify to the cranial skull base. Wide patency is seen of the left vertebrobasilar junction proximal to the left posterior-inferior cerebellar artery. A severe high-grade stenosis is seen at the distal left vertebrobasilar junction, at its confluence with the basilar artery.  Distal to the basilar artery the posterior cerebral arteries, the superior cerebellar arteries and the anterior-inferior cerebellar arteries opacify into the capillary and venous phases. Scattered focal areas of mild caliber irregularity is seen involving the proximal right PCA, the superior cerebellar arteries and the left posterior-inferior cerebellar artery. The left common carotid arteriogram demonstrates mild stenosis at the origin of the left external carotid artery. Its branches opacify widely. The left internal carotid artery at the bulb appears  to have a moderate-sized ulcerated plaque associated with mild stenosis. No evidence of intraluminal filling defects is seen. More distally the vessel is seen to opacify to the cranial skull base. There is mild stenosis at the petrous cavernous junction, and the caval cavernous region. More distally the cavernous and the supraclinoid segments are widely patent. The left middle cerebral artery and the left anterior cerebral artery opacify into the capillary and venous phases. There is approximately 50% stenosis of the proximal middle cerebral artery in the M1 segment. The right common carotid arteriogram demonstrates the right external carotid artery and its major branches to be widely patent. The right internal carotid artery at the bulb has a small ulcerated plaque along the posterior wall. No evidence of significant stenosis is seen. More distally the vessel is seen to opacify to the cranial skull base. There is mild narrowing of the petrous cavernous junction. More distally the cavernous and the supraclinoid segments are widely patent. The right middle cerebral artery and the right anterior cerebral artery opacify into the capillary and venous phases. Mild stenosis of the right anterior cerebral A1 segment at its origin is noticeable. 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 PLAN: Angiographic findings reviewed with the patient and his family, and with the stroke neurologist. Electronically Signed   By: [name] M.D.   On: 08/30/2019 10:09   IR ANGIO VERTEBRAL SEL VERTEBRAL UNI L MOD SED  Result Date: 08/31/2019 CLINICAL DATA:  Vertebrobasilar ischemic symptoms.  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 EXAM: BILATERAL COMMON CAROTID AND INNOMINATE ANGIOGRAPHY COMPARISON:  CT angiogram of the head and neck of August 27, 2019. MEDICATIONS: Heparin 2000 units IA. No antibiotic was administered within 1 hour of the procedure. ANESTHESIA/SEDATION: Versed 1 mg IV; Fentanyl 25 mcg IV Moderate Sedation Time:  47 minutes The patient was continuously monitored during the procedure by the interventional radiology nurse under my direct supervision. CONTRAST:  Isovue 300 approximately 70 cc FLUOROSCOPY TIME:  Fluoroscopy Time: 12 minutes 48 seconds (1151 mGy). COMPLICATIONS: None immediate. TECHNIQUE: Informed written consent was obtained from the patient after a thorough discussion of the procedural risks, benefits and alternatives. All questions were addressed. Maximal Sterile Barrier Technique was utilized including caps, mask, sterile gowns, sterile gloves, sterile drape, hand hygiene and skin antiseptic. A timeout was performed prior to the initiation of the procedure. The right forearm to the wrist was prepped and draped in the usual sterile manner. The right radial artery was then identified with ultrasound, and its morphology documented. A dorsal palmar anastomosis was verified to be present. Using a micropuncture set and ultrasound guidance, access into the right radial artery was obtained over a 0.018 inch micro guidewire. A 4/5 [place] radial  sheath was then inserted. The obturator, and the micro guidewire were removed. Good aspiration obtained from the side port of the sheath. A cocktail of 2000 units of heparin, 2.5 mg of verapamil, and 200 mcg of nitroglycerin was then infused in diluted form through the sheath. A right radial arteriogram was then performed. A 5 French Simmons 2 diagnostic catheter was then advanced to the aortic arch region, and select cannulation was performed of the left  vertebral artery, the right common carotid artery, the left common carotid artery, and just proximal to the origin of the right vertebral artery. Following the procedure, hemostasis at the right radial puncture site was achieved with a wrist band. Distal right radial pulse was verified to be present. FINDINGS: The origin of the right radial artery appears to be moderately stenosed. More distally the vessel is seen to ascend to the cranial skull base with opacification of the right posterior-inferior cerebellar artery and the vertebrobasilar junction just distal to this. No contrast is seen distal to this in the basilar artery. The dominant left vertebral artery origin is widely patent. The vessel has a mild to moderate tortuosity just distal to its origin. More distally the vessel is seen to opacify to the cranial skull base. Wide patency is seen of the left vertebrobasilar junction proximal to the left posterior-inferior cerebellar artery. A severe high-grade stenosis is seen at the distal left vertebrobasilar junction, at its confluence with the basilar artery. Distal to the basilar artery the posterior cerebral arteries, the superior cerebellar arteries and the anterior-inferior cerebellar arteries opacify into the capillary and venous phases. Scattered focal areas of mild caliber irregularity is seen involving the proximal right PCA, the superior cerebellar arteries and the left posterior-inferior cerebellar artery. The left common carotid arteriogram demonstrates mild stenosis at the origin of the left external carotid artery. Its branches opacify widely. The left internal carotid artery at the bulb appears to have a moderate-sized ulcerated plaque associated with mild stenosis. No evidence of intraluminal filling defects is seen. More distally the vessel is seen to opacify to the cranial skull base. There is mild stenosis at the petrous cavernous junction, and the caval cavernous region. More distally the  cavernous and the supraclinoid segments are widely patent. The left middle cerebral artery and the left anterior cerebral artery opacify into the capillary and venous phases. There is approximately 50% stenosis of the proximal middle cerebral artery in the M1 segment. The right common carotid arteriogram demonstrates the right external carotid artery and its major branches to be widely patent. The right internal carotid artery at the bulb has a small ulcerated plaque along the posterior wall. No evidence of significant stenosis is seen. More distally the vessel is seen to opacify to the cranial skull base. There is mild narrowing of the petrous cavernous junction. More distally the cavernous and the supraclinoid segments are widely patent. The right middle cerebral artery and the right anterior cerebral artery opacify into the capillary and venous phases. Mild stenosis of the right anterior cerebral A1 segment at its origin is noticeable. 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 PLAN: Angiographic findings reviewed with the patient and his family, and with the stroke neurologist. Electronically Signed   By: Harlin Rain.D.  On: 08/30/2019 10:09    Labs:  CBC: Recent Labs    08/27/19 1815 08/28/19 0407 08/30/19 0606 08/31/19 0826  WBC 6.2 5.8 8.1 7.2  HGB 13.1 12.8* 14.1 14.3  HCT 37.9* 37.2* 41.4 41.2  PLT 245 220 292 322    COAGS: Recent Labs    08/26/19 0658 08/29/19 1825  INR 1.0 1.1  APTT 28  --     BMP: Recent Labs    08/28/19 0407 08/30/19 0606 08/31/19 0826 09/01/19 0357  NA 139 139 137 138  K 3.5 3.9 3.6 3.5  CL 105 104 102 104    CO2 GLUCOSE 114* 126* 192* 129*  BUN CALCIUM 8.9 9.0 9.4 9.0  CREATININE 1.44* 1.42* 1.54* 1.44*  GFRNONAA 50* 50* 46* 50*  GFRAA 57* 58* 53* 57*    LIVER FUNCTION TESTS: Recent Labs    08/27/19 1815 08/28/19 0407 08/30/19 0606 08/31/19 0826  BILITOT 0.8 0.8 0.9 1.4*  AST ALT ALKPHOS 122 112 121 134*  PROT 6.8 6.3* 7.2 7.3  ALBUMIN 3.2* 3.0* 3.5 3.5    Assessment and Plan:  69 y/o M s/p stent assisted angioplasty of near occlusive dominant left VBJ with TICI 3 revascularization 09/01/19 with Dr. Corliss Skains seen today for routine follow up. Patient alert, oriented to self/place, follows commands appropriately, still with peripheral field visual deficits (baseline). Hematuria clearing today, oral bleeding appears improved on exam, per speech evaluation this AM who recommends dysphagia 1 diet with crushed medications.   Patient received PO Brilinta around 1030 this morning - will order p2y12 for 1400 today. IR will continue to follow along with neurology - please call with questions or concerns.   Electronically Signed: Villa Herb, PA-C 09/02/2019, 9:46 AM   I spent a total of 15 Minutes at the the patient's bedside AND on the patient's hospital floor or unit, greater than 50% of which was counseling/coordinating care for follow up left VBJ stenting.

## 2019-09-02 NOTE — Progress Notes (Signed)
Physical Therapy Treatment Patient Details Name: [name] MRN: [id number] DOB: 11/24/1950 Today's Date: 09/02/2019    History of Present Illness Pt is a 69 y.o. male presenting 08/26/19 with onset of dysequilibrium, slurred speech and difficulty walking while in [facility], went to ED but too long of a wait so came to [facility] when they came back. 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 S/p diagnostic cerebral arteriogram 7/26 revealing severe symptomatic basilar artery stenosis, thought to be cause of recent CVA. Pt underwent vertebro-basilar junction stenting 7/29 with post-op agitation.     PT Comments    Pt tolerated treatment well despite reports of sore throat and pain in BLE. Pt requires cues due to visual deficits for direction, and some assistance to power up into standing at this time. PT feels the assistance for transfers is more likely related to soreness in LE than true strength deficits as pt was agitated and required 4 point restraints last night to maintain safety. Pt will benefit from continued aggressive mobilization and PT POC to reduce falls risk and aide in a return to independence. PT recommends HHPT and use of the pt's RW at the time of discharge as well as assistance for all OOB mobility from spouse.   Follow Up Recommendations  Home health PT;Supervision/Assistance - 24 hour     Equipment Recommendations  None recommended by PT    Recommendations for Other Services       Precautions / Restrictions Precautions Precautions: Fall;Other (comment) Restrictions Weight Bearing Restrictions: No    Mobility  Bed Mobility Overal bed mobility: Needs Assistance Bed Mobility: Supine to Sit     Supine to sit: Supervision     General bed mobility comments: significant increase in time and use of rails  Transfers Overall transfer level: Needs  assistance Equipment used: Rolling walker (2 wheeled) Transfers: Sit to/from Stand Sit to Stand: Min assist Stand pivot transfers: Min guard          Ambulation/Gait Ambulation/Gait assistance: Editor, commissioning (Feet): 30 Feet Assistive device: Rolling walker (2 wheeled) Gait Pattern/deviations: Step-to pattern Gait velocity: reduced Gait velocity interpretation: <1.8 ft/sec, indicate of risk for recurrent falls General Gait Details: pt with step to gait, reduced stride length, requires cues for direction at times due to vision deficits   Stairs             Wheelchair Mobility    Modified Rankin (Stroke Patients Only) Modified Rankin (Stroke Patients Only) Pre-Morbid Rankin Score: Slight disability Modified Rankin: Moderately severe disability     Balance Overall balance assessment: Needs assistance Sitting-balance support: Single extremity supported;Feet supported Sitting balance-Leahy Scale: Poor Sitting balance - Comments: reliant on UE support   Standing balance support: Bilateral upper extremity supported Standing balance-Leahy Scale: Poor Standing balance comment: reliant on BUE support                            Cognition Arousal/Alertness: Awake/alert Behavior During Therapy: WFL for tasks assessed/performed Overall Cognitive Status: Impaired/Different from baseline Area of Impairment: Attention;Memory;Safety/judgement;Awareness;Problem solving                   Current Attention Level: Sustained Memory: Decreased recall of precautions;Decreased short-term memory Following Commands: Follows one step commands consistently Safety/Judgement: Decreased awareness of safety;Decreased awareness of deficits Awareness: Emergent Problem Solving: Slow processing  Exercises      General Comments General comments (skin integrity, edema, etc.): VSS on RA      Pertinent Vitals/Pain Pain Assessment: Faces Faces Pain  Scale: Hurts even more Pain Location: mouth Pain Descriptors / Indicators: Sore Pain Intervention(s): Monitored during session    Home Living                      Prior Function            PT Goals (current goals can now be found in the care plan section) Acute Rehab PT Goals Patient Stated Goal: to be able to do more for himself Progress towards PT goals: Progressing toward goals    Frequency    Min 4X/week      PT Plan Current plan remains appropriate    Co-evaluation              AM-PAC PT "6 Clicks" Mobility   Outcome Measure  Help needed turning from your back to your side while in a flat bed without using bedrails?: None Help needed moving from lying on your back to sitting on the side of a flat bed without using bedrails?: None Help needed moving to and from a bed to a chair (including a wheelchair)?: A Little Help needed standing up from a chair using your arms (e.g., wheelchair or bedside chair)?: A Little Help needed to walk in hospital room?: A Little Help needed climbing 3-5 steps with a railing? : A Lot 6 Click Score: 19    End of Session Equipment Utilized During Treatment: Gait belt Activity Tolerance: Patient tolerated treatment well Patient left: in chair;with call bell/phone within reach;with chair alarm set;with family/visitor present Nurse Communication: Mobility status PT Visit Diagnosis: Muscle weakness (generalized) (M62.81);Difficulty in walking, not elsewhere classified (R26.2)     Time: 7062-3762 PT Time Calculation (min) (ACUTE ONLY): 29 min  Charges:  $Gait Training: 8-22 mins $Therapeutic Activity: 8-22 mins                     Arlyss Gandy, PT, DPT Acute Rehabilitation Pager: [phone]    Arlyss Gandy 09/02/2019, 12:58 PM

## 2019-09-02 NOTE — Progress Notes (Signed)
STROKE TEAM PROGRESS NOTE   INTERVAL HISTORY Wife at bedside.  Patient sitting in bed, awake alert, neuro stable.  Still has dysarthria but able to pass swallow on pured diet and thin liquid.  As per speech therapist, patient still has uvula swelling likely due to intubation yesterday.  Patient still has hematuria but improved from yesterday, continue Foley catheter.  On aspirin Brilinta.  Heparin IV discontinued.  OBJECTIVE Vitals:   09/02/19 0600 09/02/19 0615 09/02/19 0700 09/02/19 0800  BP: 120/75 103/68 116/67   Pulse: 65 59 64   Resp: 17 16 12    Temp:    99.4 F (37.4 C)  TempSrc:    Oral  SpO2: 100% 100% 100%   Weight:      Height:       CBC:  Recent Labs  Lab 08/30/19 0606 08/31/19 0826  WBC 8.1 7.2  NEUTROABS 5.5 5.3  HGB 14.1 14.3  HCT 41.4 41.2  MCV 93.5 93.0  PLT 292 322   Basic Metabolic Panel:  Recent Labs  Lab 08/30/19 0606 08/30/19 0606 08/31/19 0826 09/01/19 0357  NA 139   < > 137 138  K 3.9   < > 3.6 3.5  CL 104   < > 102 104  CO2 25   < > 26 23  GLUCOSE 126*   < > 192* 129*  BUN 13   < > 15 17  CREATININE 1.42*   < > 1.54* 1.44*  CALCIUM 9.0   < > 9.4 9.0  MG 2.0  --  2.0  --   PHOS 3.0  --  2.8  --    < > = values in this interval not displayed.   Lipid Panel:     Component Value Date/Time   CHOL 96 08/27/2019 1815   TRIG 96 08/27/2019 1815   HDL 22 (L) 08/27/2019 1815   CHOLHDL 4.4 08/27/2019 1815   VLDL 19 08/27/2019 1815   LDLCALC 55 08/27/2019 1815   HgbA1c:  Lab Results  Component Value Date   HGBA1C 6.2 (H) 08/27/2019   Urine Drug Screen: No results found for: LABOPIA, COCAINSCRNUR, LABBENZ, AMPHETMU, THCU, LABBARB  Alcohol Level No results found for: [facility]  IMAGING  CT HEAD WO CONTRAST 08/26/2019 1. No acute intracranial abnormality. Signs of prior infarct, atrophy and chronic microvascular ischemic change.  2. Infarcts exhibited recent MRI not well seen on today's study with evidence of remote infarct in the bilateral  occipital poles but without signs of acute intracranial process.  3. Bilateral maxillary sinus disease, new since previous imaging worse on the RIGHT.    MR BRAIN WO CONTRAST 08/26/2019 Acute infarcts involving the bilateral occipital lobes and anterior left pons. Remote infarcts involving the bilateral occipital lobes, bilateral cerebellum, dorsal left midbrain, bilateral centrum semiovale, bilateral thalami and left frontal white matter. 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 2.Oclluded v severe stenosis of non dominant RT VBJ marred by motion artifact. 3.Approx 50 % stenosis LT MCA M 1 seg.  ECHOCARDIOGRAM COMPLETE 08/27/2019 1. Left ventricular ejection fraction, by estimation, is 45 to 50%. The left ventricle has mildly decreased function. The left ventricle demonstrates global hypokinesis. There is mild concentric left ventricular hypertrophy. Left ventricular diastolic parameters are indeterminate.  2. Right ventricular systolic function is normal. The right ventricular size is normal.  3. The mitral valve is  normal in structure. Trivial mitral valve regurgitation. No evidence of mitral stenosis.  4. The aortic valve is tricuspid. Aortic valve regurgitation is mild.  5. The inferior vena cava is normal in size with greater than 50% respiratory variability, suggesting right atrial pressure of 3 mmHg. Conclusion(s)/Recommendation(s): No intracardiac source of embolism detected on this transthoracic study.  Interventional Radiology - Cerebral Angiogram + Stent 09/01/2019 S/P LT VA arteriogram followed by stent assisted angioplasty of near occlusive dominant Lt VBJ  with TICI 3 revascularization.  ECG - SR rate 83 BPM. (See cardiology reading for complete details)   PHYSICAL EXAM    Temp:  [97.6 F (36.4 C)-99.4 F (37.4 C)]  99.4 F (37.4 C) (07/30 0800) Pulse Rate:  [59-123] 64 (07/30 0700) Resp:  [12-30] 12 (07/30 0700) BP: (102-206)/(56-152) 116/67 (07/30 0700) SpO2:  [86 %-100 %] 100 % (07/30 0700) Arterial Line BP: (125-134)/(69-72) 134/72 (07/29 1555)  General - Well nourished, well developed, not in acute distress.  Ophthalmologic - fundi not visualized due to noncooperation.  Cardiovascular - Regular rhythm and rate.  Neuro - awake, alert, orientated to time place and people.  No aphasia, however mild  to moderate dysarthria.  Follows simple commands, able to name and repeat.  Visual field grossly full with finger moving test but not counting fingers, decreased visual acuity bilaterally.  Extraocular movement grossly intact, denies diplopia.  Right nasolabial fold flattening, tongue midline.  Uvula swelling. Right upper extremity 5-/5 with slight pronator drift.  Right upper extremity and bilateral lower extremity 5/5.  Bilateral finger-to-nose intact, however bilateral heel-to-shin mild dysmetria.  Sensation symmetrical. Able to walk with walker, small stride, slow walking, stooped posturing.    ASSESSMENT/PLAN [name] is a 69 y.o. male with history of HLD (hyperlipidemia), MI (myocardial infarction) (HCC), and prior Strokes (HCC) who presents with dysarthria.  He did not receive IV t-PA.  Strokes: Acute infarcts involving the bilateral occipital lobes and anterior left pons - large vessel disease due to VA stenosis/occlusion  CT head -  No acute intracranial abnormality  MRI head - Acute infarcts involving the bilateral occipital lobes and anterior left pons. Remote infarcts involving the bilateral occipital lobes, bilateral cerebellum, dorsal left midbrain, bilateral centrum semiovale, bilateral thalami and left frontal white matter. 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   CTA head and neck - right V4 occluded. Left VBJ high grade stenosis, BA  patent  Cerebral angio - Left VBJ 95% stenosis. Right VA distal occlusion.  2D Echo - EF 45-50%  [place] Corona Virus 2 - negative  LDL - 55  HgbA1c 6.2  P2Y12 39  VTE prophylaxis - [place] Heparin  aspirin 81 mg daily and clopidogrel 75 mg daily prior to admission, now on aspirin 81 mg daily and Brilinta (ticagrelor) 90 mg bid.  Patient counseled to be compliant with his antithrombotic medications  Ongoing aggressive stroke risk factor management  Therapy recommendations:  HH PT, HH OT, HH SLP  Disposition:  Pending, transfer out of ICU  Recurrent posterior infarcts  05/2019 MRI bilateral occipital PCA infarcts, CTA head and neck positive for Severe stenosis of the bilateral VBJ, and moderate to severe stenoses of both proximal Vertebral Arteries.  60% stenosis Left ICA bulb due to soft plaque. Moderate bilateral. ICA siphon stenosis due to bulky plaque. Mild left MCA M1 stenosis. Mild to moderate ACA A2 stenoses.  07/2019 Multiple acute infarcts of the cerebellumand occipital lobes  bilaterally as well as the left  thalamus and posterior left mid brain. CTA H&N- June 2021- severe bilateral vertebrobasilar junction stenosis and advanced atherosclerotic changes elsewhere in the head and neck including moderate to severe bilateral vertebral artery origin stenosis, 60% stenosis left ICA bulb.  cerebral angio Left VBJ 95% stenosis. Right VA distal occlusion.  P2Y12 39  Left VBJ stent placed 7/29 by Dr. Corliss Skains   Continue aspirin 81 and Brilinta 90 bid  Follow up with Dr. Corliss Skains in 4 weeks.  Agitation post op  Mild delirium   S/p ativan 1mg  + 2mg   precedex bolus and IV infusion -> off  Soft restraint  Resolved overnight  Hypertension  Home BP meds: Norvasc ; Coreg  Current BP meds: None . BP goal 120-140 for 24h post op . On cleviprex PRN . Long term BP goal normotensive  Hyperlipidemia  Home Lipid lowering medication: Lipitor 80 mg daily  LDL - 55 goal <  70  Current lipid lowering medication: Lipitor 80 mg daily   Continue statin at discharge  DM type II, controlled  HgbA1c 6.2  No home meds  Very sensitive SSI  PCP follow up  Acute urinary retention  Large volume urine bladder scan  Hematuria  S/p I&O  on foley catheter  Continue monitoring - further plan per Dr> Arrien  Dysphagia secondary to uvula edema  SLP on board  Feels no real change in swallow function since MBS  Cleared for D1 (pureed), thin liquids w/ aspiration precautions   Other Stroke Risk Factors  Advanced age  Previous ETOH use.  Obesity, Body mass index is 34.44 kg/m., recommend weight loss, diet and exercise as appropriate   Coronary artery disease  Other Active Problems  Code status - Full code CKD - stage 3a - cre 1.56->1.60->1.47->1.35->1.44->1.42->1.54->1.44  Palliative Care Consult - 08/28/19 - Full code. Wife reviewing literature. OP f/u.  Hospital day # 6  Neurology will sign off. Please call with questions. Pt will follow up with stroke clinic Dr. at [facility] in about 4 weeks. Thanks for the consult.    Pearlean Brownie, MD PhD Stroke Neurology 09/02/2019 11:23 AM   To contact Stroke Continuity provider, please refer to Marvel Plan. After hours, contact General Neurology

## 2019-09-02 NOTE — Progress Notes (Signed)
Pt arrived to unit via wheelchair from 4N. He is awake and alert. Denies any pain and/or discomfort at this time. Spouse present at bedside. Oriented both to room and unit and updated on plan of care and sequence of events. Both verbalize full understanding.

## 2019-09-02 NOTE — Progress Notes (Signed)
  Speech Language Pathology Treatment: Dysphagia  Patient Details Name: [name] MRN: [id number] DOB: 02-23-1950 Today's Date: 09/02/2019 Time: 0973-5329 SLP Time Calculation (min) (ACUTE ONLY): 32 min  Assessment / Plan / Recommendation Clinical Impression  Patient seen for re-evaluation of swallowing per request of MD/RN. Pt is s/p basilar artery stenting 7/29 with post op agitation. Reviewed MBS results from 7/28 which noted mild oropharyngeal dysphagia. Initial po trials provided by this SLP at bedside this am, resulting in immediate throat clearing, gagging, coughing, and expectoration of bolus. In addition, patient with complaints of oral pain as well as need to expectorate additional oral residue despite none observed. Per RN, large amounts of dried blood cleaned from oral cavity this am. Upon additional observation of oral cavity, SLP notes that uvula with significant swelling, actively bleeding at the tip which is bright red and bulbous. Suspect trauma from intubation 7/28. At that time, Mallampati score II, now judged to be close to IV per this SLP. Once patient calm, proceeded with additional po trials of pureed solid in which patient was able to consume via small, 1/2 tsp size bites, without overt s/s of aspiration, piecemeal mastication of bolus. Following, patient also able to consume small straw sips of thin liquid with similar presentation, no overt s/s of aspiration, maximum verbal and tactile cueing for use of chin tuck per MBS recommendations. Suspect that pharyngeal swallow is largely unchanged from performance during MBS 7/28, and that new uvula edema is primary impact of perceived new difficulty swallowing. Recommend dysphagia 1 (pureed) solids, thin liquids with aspiration precautions to start. SLP will follow along for changes and ability to advance diet.    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 Pt was  admitted back in April with CVA of PCA territory as well and then again on 6/27. Pt began to experience dysequilibrium, slurred speech and difficulty walking while in [facility], went to ED but too long of a wait so came to [facility] when they came back.       SLP Plan  Continue with current plan of care       Recommendations  Diet recommendations: Dysphagia 1 (puree);Thin liquid Liquids provided via: Cup;Straw Medication Administration: Crushed with puree Supervision: Patient able to self feed;Full supervision/cueing for compensatory strategies Compensations: Slow rate;Small sips/bites;Chin tuck Postural Changes and/or Swallow Maneuvers: Seated upright 90 degrees                Oral Care Recommendations: Oral care QID Follow up Recommendations: Home health SLP;24 hour supervision/assistance SLP Visit Diagnosis: Dysphagia, unspecified (R13.10) Plan: Continue with current plan of care                    [facility] MA, CCC-SLP     Bernabe Dorce Meryl 09/02/2019, 10:33 AM

## 2019-09-02 NOTE — Progress Notes (Signed)
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  69 year old male with significant past medical history for coronary disease, dyslipidemia and prior CVA who presented with slurred speech, dizziness and difficulty walking. Recent hospitalization April and June2021 for acute CVA. Apparently he recovered well but had recurrent symptoms that prompted him to come back to the hospital. On his initial physical examination blood pressure 148/86, heart rate 69, temperature 99.4, respiratory rate 14, oxygen saturation 100%. Patient was awake and alert, his strength was preserved, his lungs were clear to auscultation bilaterally, heart S1-S2, present rhythmic, soft abdomen, no lower extremity edema. Brain MRI showed acute infarcts involving the bilateral occipital lobesandanterior left pons.  Patient was admitted to the telemetry ward, he had frequent neuro checks and neurology was consulted. 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 Occluded severe stenosis of nondominant rightVBG.  Echocardiogram with preserved LV systolic function. Patient placed on aspirin and ticagrelor, interventional radiology has been consulted for VBG stenting.   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 Acute infarcts involving the bilateral occipital lobes and anterior left pons,large vessel disease due to VA stenosis/occlusion.  Sp stent angioplasty of left VBJ. Patient continue to have confusion and placed on dexmedetomidine infusion. This am is more calm and cooperative.   Continue with dual antiplatelet therapy: asa and ticagrelor. On with atorvastatin.  Now off heparin infusion per protocol. Will follow IR and neurology recommendations, for discharge planing.   2. HTN.  Continue with amlodipine and carvedilol for blood pressure control.   3. Uncontrolled T2DM Hgb A1c 6.1 with dyslipidemia. Continue with  insulin sliding scale for glucose cover and monitoring   Continue with statin therapy.   4. CKD stage 3a.  Serum cr 1,45 with K at 3,5 and serum bicarbonate at 21.  Will dc IV fluids for now.   5. Constipation. Persistent constipation despite miralax and dulcolax, will add one dose of fleet enema and follow on response.     Status is: Inpatient  Remains inpatient appropriate because:Inpatient level of care appropriate due to severity of illness   Dispo: The patient is from: Home              Anticipated d/c is to: Home              Anticipated d/c date is: 1 day              Patient currently is not medically stable to d/c.     DVT prophylaxis: scd   Code Status:    full  Family Communication:  I spoke with patient's wife at the bedside, we talked in detail about patient's condition, plan of care and prognosis and all questions were addressed.       Consultants:   Neurology  IR   Procedures:  S/P LT VA arteriogram followed by stent assisted angioplasty of near occlusive dominant Lt VBJ  with TICI 3 revascularization.     Subjective: Patient continue to have constipation, no nausea or vomiting, no chest pain. Yesterday was agitated and required sedative.   Objective: Vitals:   09/02/19 1130 09/02/19 1200 09/02/19  1230 09/02/19 1300  BP: 121/69 (!) 132/89 (!) 110/63 111/77  Pulse: 74 49 58 75  Resp: 16 18 13 15   Temp:  97.7 F (36.5 C)    TempSrc:  Oral    SpO2: 99% (!) 87% 97% 98%  Weight:      Height:        Intake/Output Summary (Last 24 hours) at 09/02/2019 1422 Last data filed at 09/02/2019 1300 Gross per 24 hour  Intake 1701.19 ml  Output 2205 ml  Net -503.81 ml   Filed Weights   08/27/19 0313  Weight: (!) 108.9 kg    Examination:   General: Not in pain or dyspnea. Deconditioned  Neurology: Awake and alert, non focal  E ENT: no pallor, no icterus, oral mucosa moist Cardiovascular: No JVD. S1-S2 present, rhythmic, no gallops, rubs, or murmurs. No lower extremity edema. Pulmonary: positive breath sounds bilaterally, adequate air movement, no wheezing, rhonchi or rales. Gastrointestinal. Abdomen mild distended, soft and non tender Skin. No rashes Musculoskeletal: no joint deformities     Data Reviewed: I have personally reviewed following labs and imaging studies  CBC: Recent Labs  Lab 08/27/19 1815 08/28/19 0407 08/30/19 0606 08/31/19 0826 09/02/19 1050  WBC 6.2 5.8 8.1 7.2 11.9*  NEUTROABS 3.8 3.3 5.5 5.3 10.1*  HGB 13.1 12.8* 14.1 14.3 11.7*  HCT 37.9* 37.2* 41.4 41.2 34.4*  MCV 92.4 92.3 93.5 93.0 93.7  PLT 245 220 292 322 276   Basic Metabolic Panel: Recent Labs  Lab 08/27/19 1815 08/27/19 1815 08/28/19 0407 08/30/19 0606 08/31/19 0826 09/01/19 0357 09/02/19 1050  NA 138   < > 139 139 137 138 138  K 3.7   < > 3.5 3.9 3.6 3.5 3.5  CL 102   < > 105 104 102 104 108  CO2 25   < > 25 25 26 23  21*  GLUCOSE 129*   < > 114* 126* 192* 129* 144*  BUN 15   < > 14 13 15 17 17   CREATININE 1.35*   < > 1.44* 1.42* 1.54* 1.44* 1.45*  CALCIUM 8.9   < > 8.9 9.0 9.4 9.0 8.8*  MG 1.8  --  2.0 2.0 2.0  --   --   PHOS 3.6  --  3.5 3.0 2.8  --   --    < > = values in this interval not displayed.   GFR: Estimated Creatinine Clearance: 60.3 mL/min (A) (by C-G formula  based on SCr of 1.45 mg/dL (H)). Liver Function Tests: Recent Labs  Lab 08/27/19 1815 08/28/19 0407 08/30/19 0606 08/31/19 0826  AST 24 21 30 31   ALT 23 19 29 31   ALKPHOS 122 112 121 134*  BILITOT 0.8 0.8 0.9 1.4*  PROT 6.8 6.3* 7.2 7.3  ALBUMIN 3.2* 3.0* 3.5 3.5   No results for input(s): LIPASE, AMYLASE in the last 168 hours. No results for input(s): AMMONIA in the last 168 hours. Coagulation Profile: Recent Labs  Lab 08/29/19 1825  INR 1.1   Cardiac Enzymes: No results for input(s): CKTOTAL, CKMB, CKMBINDEX, TROPONINI in the last 168 hours. BNP (last 3 results) No results for input(s): PROBNP  in the last 8760 hours. HbA1C: No results for input(s): HGBA1C in the last 72 hours. CBG: Recent Labs  Lab 09/01/19 1930 09/01/19 2330 09/02/19 0342 09/02/19 0812 09/02/19 1202  GLUCAP 175* 150* 138* 122* 122*   Lipid Profile: No results for input(s): CHOL, HDL, LDLCALC, TRIG, CHOLHDL, LDLDIRECT in the last 72 hours. Thyroid Function Tests: No results for input(s): TSH, T4TOTAL, FREET4, T3FREE, THYROIDAB in the last 72 hours. Anemia Panel: No results for input(s): VITAMINB12, FOLATE, FERRITIN, TIBC, IRON, RETICCTPCT in the last 72 hours.    Radiology Studies: I have reviewed all of the imaging during this hospital visit personally     Scheduled Meds: . aspirin  81 mg Oral Daily   Or  . aspirin  81 mg Per Tube Daily  . atorvastatin  80 mg Oral q1800  . bisacodyl  10 mg Rectal Once  . Chlorhexidine Gluconate Cloth  6 each Topical Daily  . febuxostat  40 mg Oral Daily  . FLUoxetine  10 mg Oral Daily  . insulin aspart  0-6 Units Subcutaneous Q4H  . multivitamin with minerals  1 tablet Oral Daily  . polyethylene glycol  17 g Oral BID  . ticagrelor  90 mg Oral BID   Or  . ticagrelor  90 mg Per Tube BID   Continuous Infusions: . sodium chloride 75 mL/hr at 09/02/19 1300     LOS: 6 days        Leonie Amacher Annett Gula, MD

## 2019-09-03 DIAGNOSIS — N1831 Chronic kidney disease, stage 3a: Secondary | ICD-10-CM | POA: Diagnosis not present

## 2019-09-03 DIAGNOSIS — E785 Hyperlipidemia, unspecified: Secondary | ICD-10-CM | POA: Diagnosis not present

## 2019-09-03 DIAGNOSIS — E1159 Type 2 diabetes mellitus with other circulatory complications: Secondary | ICD-10-CM | POA: Diagnosis not present

## 2019-09-03 DIAGNOSIS — R410 Disorientation, unspecified: Secondary | ICD-10-CM | POA: Diagnosis not present

## 2019-09-03 LAB — CBC
HCT: 34.6 % — ABNORMAL LOW (ref 39.0–52.0)
Hemoglobin: 11.9 g/dL — ABNORMAL LOW (ref 13.0–17.0)
MCH: 33.1 pg (ref 26.0–34.0)
MCHC: 34.4 g/dL (ref 30.0–36.0)
MCV: 96.1 fL (ref 80.0–100.0)
Platelets: 307 10*3/uL (ref 150–400)
RBC: 3.6 MIL/uL — ABNORMAL LOW (ref 4.22–5.81)
RDW: 13.6 % (ref 11.5–15.5)
WBC: 11.2 10*3/uL — ABNORMAL HIGH (ref 4.0–10.5)
nRBC: 0 % (ref 0.0–0.2)

## 2019-09-03 LAB — BASIC METABOLIC PANEL
Anion gap: 10 (ref 5–15)
BUN: 18 mg/dL (ref 8–23)
CO2: 22 mmol/L (ref 22–32)
Calcium: 8.6 mg/dL — ABNORMAL LOW (ref 8.9–10.3)
Chloride: 107 mmol/L (ref 98–111)
Creatinine, Ser: 1.49 mg/dL — ABNORMAL HIGH (ref 0.61–1.24)
GFR calc Af Amer: 55 mL/min — ABNORMAL LOW (ref >60)
GFR calc non Af Amer: 48 mL/min — ABNORMAL LOW (ref >60)
Glucose, Bld: 112 mg/dL — ABNORMAL HIGH (ref 70–99)
Potassium: 3.2 mmol/L — ABNORMAL LOW (ref 3.5–5.1)
Sodium: 139 mmol/L (ref 135–145)

## 2019-09-03 LAB — GLUCOSE, CAPILLARY
Glucose-Capillary: 129 mg/dL — ABNORMAL HIGH (ref 70–99)
Glucose-Capillary: 123 mg/dL — ABNORMAL HIGH (ref 70–99)
Glucose-Capillary: 124 mg/dL — ABNORMAL HIGH (ref 70–99)

## 2019-09-03 MED ORDER — TICAGRELOR 90 MG PO TABS: 90.0000 mg | ORAL_TABLET | Freq: Two times a day (BID) | ORAL | 0 refills | Status: AC

## 2019-09-03 MED ORDER — POLYETHYLENE GLYCOL 3350 17 G PO PACK: 17.0000 g | PACK | Freq: Every day | ORAL | 0 refills | Status: DC | PRN

## 2019-09-03 NOTE — Progress Notes (Signed)
Discharge instructions provided to patient and son Casimiro Needle, questions answered. [facility] card given to patient and belongings returned

## 2019-09-03 NOTE — Care Management (Addendum)
Bril[facility] card sent to unit, RN aware.  HH set up w Frances Furbish

## 2019-09-03 NOTE — Discharge Summary (Addendum)
Physician Discharge Summary  [name] SWF:[id number] DOB: [birth year] DOA: 08/26/2019  PCP: Raymond Basset, PA-C  Admit date: 08/26/2019 Discharge date: 09/03/2019  Admitted From: Home  Disposition:  Home   Recommendations for Outpatient Follow-up and new medication changes:  1. Follow up with [name] in 7 days.  2. Patient has been placed on dual antiplatelet therapy with asa and ticagrelor.   Home Health: yes   Equipment/Devices: walker    Discharge Condition: stable  CODE STATUS: full  Diet recommendation: heart healthy and diabetic prudent.   Brief/Interim Summary: Patient admitted to the hospital with the working diagnosis of acute infarcts involving the bilateral occipital lobes and anterior left pons,large vessel disease due to VA stenosis/occlusion.  69 year old male with significant past medical history for coronary disease, dyslipidemia and prior CVA who presented with slurred speech, dizziness and difficulty walking. Recent hospitalization April and June2021 for acute CVA. Apparently he recovered well but had recurrent symptoms that prompted him to come back to the hospital. On his initial physical examination blood pressure 148/86, heart rate 69, temperature 99.4, respiratory rate 14, oxygen saturation 100%. Patient was awake and alert, his strength was preserved, his lungs were clear to auscultation bilaterally, heart S1-S2, present rhythmic, soft abdomen, no lower extremity edema. Sodium 138, potassium 3.7, chloride 103, bicarb 25, glucose 163, BUN 16, creatinine 1.56.  White count 7.9, hemoglobin 14.0, hematocrit 40, platelets 281.  SARS Covid negative negative. Head CT with no acute intracranial changes. Brain MRI showed acute infarcts involving the bilateral occipital lobesandanterior left pons. EKG 83 bpm, normal axis, right bundle branch block, sinus rhythm, no ST segment or T wave changes.  Patient was admitted to the telemetry ward, he had  frequent neuro checks and neurology was consulted. 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 Occluded severe stenosis of nondominant rightVBG.  Echocardiogram with preserved LV systolic function. Patient placed on aspirin and ticagrelor, interventional radiology was consulted and he underwent VBG stenting.    1.  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  Was admitted to the medical ward, he was placed on a remote telemetry monitor.  Clinically his neurologic status slowly improved. Patient underwent CT head, neck and cerebral angiography, left VBG 95% stenosis, right VA distal occlusion. Further work-up with echocardiography showed preserved LV systolic function.  Patient was placed on dual antiplatelet therapy, aspirin and ticagrelor. Patient underwent left VBJ stent placed July 29 with good toleration.  2.  Hypertension.  Continue blood pressure control with carvedilol and amlodipine.  3.  Uncontrolled type II times mellitus, hemoglobin A1c 6.1, dyslipidemia.  Patient was placed on insulin sliding scale for glucose coverage and monitoring.  Continue statin therapy.  4.  Chronic kidney disease stage IIIa/ hypokalemia.  Kidney function was closely monitored during his hospitalization.  At discharge serum creatinine 1.49, BUN 18, sodium 139, potassium 3.2, chloride 107, bicarb 22.  Patient will receive potassium chloride before discharge.  5.  Acute metabolic encephalopathy.  Patient had episodic confusion and agitation during his hospitalization.  Postoperative he required short course of dexmedetomidine infusion.  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 Pearlean Brown at [facility] in 4 weeks. Too complicated for NP to follow. Thanks.     Allergies as of 09/03/2019   No Known Allergies     Medication List    STOP taking these medications   clopidogrel 75 MG tablet Commonly known as: PLAVIX     TAKE these medications   amLODipine 2.5 MG tablet Commonly known as: NORVASC Take 2.5 mg by mouth daily.   aspirin EC 81 MG tablet Take 81 mg by mouth daily. Swallow whole.   atorvastatin 80 MG tablet Commonly known as: LIPITOR Take 80 mg by mouth daily at 6 PM.   carvedilol 3.125 MG tablet Commonly known as: COREG Take 3.125 mg by mouth 2 (two) times daily.   febuxostat 40 MG tablet Commonly known as: ULORIC Take 40 mg by mouth daily.   FLUoxetine 10 MG capsule Commonly known as: PROZAC Take 10 mg by mouth daily.   Mitigare 0.6 MG Caps Generic drug: Colchicine Take 0.6-1.2 mg by mouth 2 (two) times daily as needed (gout flareup).   ONE-A-DAY MENS 50+ PO Take 1 tablet by mouth daily.   polyethylene glycol 17 g packet Commonly known as: MIRALAX / GLYCOLAX Take 17 g by mouth daily as needed for moderate constipation.   TART CHERRY ADVANCED PO Take 1,200 mg by mouth daily.   ticagrelor 90 MG Tabs tablet Commonly known as: BRILINTA Take 1 tablet (90 mg total) by mouth 2 (two) times daily.       Follow-up Information    Raymond Riley, MD. Schedule an appointment as soon as possible for a visit in 4 week(s).   Specialties: Neurology, Radiology Contact information: [address] [place] [address] [phone]        Raymond Cotton, MD. Schedule an  appointment as soon as possible for a visit in 4 week(s).   Specialties: Interventional Radiology, Radiology Contact information: [address] [place] [address] [phone]              No Known Allergies  Consultations:  Neurology   IR    Procedures/Studies: CT ANGIO HEAD W OR WO CONTRAST  Result Date: 08/28/2019 CLINICAL DATA:  Stroke follow-up EXAM: CT ANGIOGRAPHY HEAD AND NECK TECHNIQUE: Multidetector CT imaging of the head and neck was performed using the standard protocol during bolus administration of intravenous contrast. Multiplanar CT image reconstructions and MIPs were obtained to evaluate the vascular anatomy. Carotid stenosis measurements (when applicable) are obtained utilizing NASCET criteria, using the distal internal carotid diameter as the denominator. CONTRAST:  75mL OMNIPAQUE IOHEXOL 350 MG/ML SOLN COMPARISON:  08/01/2019 FINDINGS: CT HEAD FINDINGS Brain: There is no mass, hemorrhage or extra-axial collection. There is generalized atrophy without lobar predilection. There are old bilateral PCA territory infarcts. There is hypoattenuation of the periventricular white matter, most commonly indicating chronic ischemic microangiopathy. Skull: The visualized skull base, calvarium and extracranial soft tissues are normal. Sinuses/Orbits: Moderate maxillary and ethmoid sinus mucosal thickening. The orbits are normal. CTA NECK FINDINGS SKELETON: There is no bony spinal canal stenosis. No lytic or blastic lesion. OTHER NECK: Normal pharynx, larynx and major salivary glands. No cervical lymphadenopathy. Unremarkable thyroid gland. UPPER CHEST: No pneumothorax or pleural effusion. No nodules  or masses. AORTIC ARCH: There is mild calcific atherosclerosis of the aortic arch. There is no aneurysm, dissection or hemodynamically significant stenosis of the visualized portion of the aorta. Conventional 3 vessel aortic branching pattern. The visualized proximal subclavian arteries  are widely patent. RIGHT CAROTID SYSTEM: No dissection, occlusion or aneurysm. Mild atherosclerotic calcification at the carotid bifurcation without hemodynamically significant stenosis. LEFT CAROTID SYSTEM: No dissection, occlusion or aneurysm. There is mixed density atherosclerosis extending into the proximal ICA, resulting in less than 50% stenosis. VERTEBRAL ARTERIES: Left dominant configuration. There at least moderate atherosclerotic stenosis of the right V1 segment. There is otherwise no dissection, occlusion or flow-limiting stenosis to the skull base (V1-V3 segments). CTA HEAD FINDINGS POSTERIOR CIRCULATION: --Vertebral arteries: There is severe narrowing of the distal V4 segments, unchanged --Inferior cerebellar arteries: Normal. --Basilar artery: Normal. --Superior cerebellar arteries: Normal. --Posterior cerebral arteries (PCA): Normal. ANTERIOR CIRCULATION: --Intracranial internal carotid arteries: Atherosclerotic calcification of the internal carotid arteries at the skull base with moderate bilateral stenosis. --Anterior cerebral arteries (ACA): Normal. Both A1 segments are present. Patent anterior communicating artery (a-comm). --Middle cerebral arteries (MCA): Normal. VENOUS SINUSES: As permitted by contrast timing, patent. ANATOMIC VARIANTS: None Review of the MIP images confirms the above findings. IMPRESSION: 1. No emergent large vessel occlusion. 2. 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 3. 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 4. Unchanged old bilateral PCA territory infarcts and chronic ischemic microangiopathy. 5. Aortic Atherosclerosis (ICD10-I70.0). Electronically Signed   By: Deatra Robinson M.D.   On: 08/28/2019 20:47   CT HEAD WO CONTRAST  Result Date: 08/26/2019 CLINICAL DATA:  Neuro deficit. EXAM: CT HEAD WITHOUT CONTRAST TECHNIQUE: Contiguous axial images were obtained from the base of the skull through  the vertex without intravenous contrast. COMPARISON:  MRI of the brain of August 01, 2019 FINDINGS: Brain: No evidence of acute infarction, hemorrhage, hydrocephalus, extra-axial collection or mass lesion/mass effect. Infarcts exhibited recent MRI not well seen on today's study with evidence of remote infarct in the bilateral occipital poles but without signs of acute intracranial process. Vascular: No hyperdense vessel or unexpected calcification. Skull: Normal. Negative for fracture or focal lesion. Sinuses/Orbits: Bilateral maxillary sinus disease, new since previous imaging worse on the RIGHT. Other: None. IMPRESSION: 1. No acute intracranial abnormality. Signs of prior infarct, atrophy and chronic microvascular ischemic change. 2. Infarcts exhibited recent MRI not well seen on today's study with evidence of remote infarct in the bilateral occipital poles but without signs of acute intracranial process. 3. Bilateral maxillary sinus disease, new since previous imaging worse on the RIGHT. Electronically Signed   By: Donzetta Kohut M.D.   On: 08/26/2019 08:32   CT ANGIO NECK W OR WO CONTRAST  Result Date: 08/28/2019 CLINICAL DATA:  Stroke follow-up EXAM: CT ANGIOGRAPHY HEAD AND NECK TECHNIQUE: Multidetector CT imaging of the head and neck was performed using the standard protocol during bolus administration of intravenous contrast. Multiplanar CT image reconstructions and MIPs were obtained to evaluate the vascular anatomy. Carotid stenosis measurements (when applicable) are obtained utilizing NASCET criteria, using the distal internal carotid diameter as the denominator. CONTRAST:  81mL OMNIPAQUE IOHEXOL 350 MG/ML SOLN COMPARISON:  08/01/2019 FINDINGS: CT HEAD FINDINGS Brain: There is no mass, hemorrhage or extra-axial collection. There is generalized atrophy without lobar predilection. There are old bilateral PCA territory infarcts. There is hypoattenuation of the periventricular white matter, most commonly  indicating chronic ischemic microangiopathy. Skull: The visualized skull base, calvarium and  extracranial soft tissues are normal. Sinuses/Orbits: Moderate maxillary and ethmoid sinus mucosal thickening. The orbits are normal. CTA NECK FINDINGS SKELETON: There is no bony spinal canal stenosis. No lytic or blastic lesion. OTHER NECK: Normal pharynx, larynx and major salivary glands. No cervical lymphadenopathy. Unremarkable thyroid gland. UPPER CHEST: No pneumothorax or pleural effusion. No nodules or masses. AORTIC ARCH: There is mild calcific atherosclerosis of the aortic arch. There is no aneurysm, dissection or hemodynamically significant stenosis of the visualized portion of the aorta. Conventional 3 vessel aortic branching pattern. The visualized proximal subclavian arteries are widely patent. RIGHT CAROTID SYSTEM: No dissection, occlusion or aneurysm. Mild atherosclerotic calcification at the carotid bifurcation without hemodynamically significant stenosis. LEFT CAROTID SYSTEM: No dissection, occlusion or aneurysm. There is mixed density atherosclerosis extending into the proximal ICA, resulting in less than 50% stenosis. VERTEBRAL ARTERIES: Left dominant configuration. There at least moderate atherosclerotic stenosis of the right V1 segment. There is otherwise no dissection, occlusion or flow-limiting stenosis to the skull base (V1-V3 segments). CTA HEAD FINDINGS POSTERIOR CIRCULATION: --Vertebral arteries: There is severe narrowing of the distal V4 segments, unchanged --Inferior cerebellar arteries: Normal. --Basilar artery: Normal. --Superior cerebellar arteries: Normal. --Posterior cerebral arteries (PCA): Normal. ANTERIOR CIRCULATION: --Intracranial internal carotid arteries: Atherosclerotic calcification of the internal carotid arteries at the skull base with moderate bilateral stenosis. --Anterior cerebral arteries (ACA): Normal. Both A1 segments are present. Patent anterior communicating artery  (a-comm). --Middle cerebral arteries (MCA): Normal. VENOUS SINUSES: As permitted by contrast timing, patent. ANATOMIC VARIANTS: None Review of the MIP images confirms the above findings. IMPRESSION: 1. No emergent large vessel occlusion. 2. 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 3. 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 4. Unchanged old bilateral PCA territory infarcts and chronic ischemic microangiopathy. 5. Aortic Atherosclerosis (ICD10-I70.0). Electronically Signed   By: Deatra Robinson M.D.   On: 08/28/2019 20:47   MR BRAIN WO CONTRAST  Result Date: 08/26/2019 CLINICAL DATA:  Neuro deficit. EXAM: MRI HEAD WITHOUT CONTRAST TECHNIQUE: Multiplanar, multiecho pulse sequences of the brain and surrounding structures were obtained without intravenous contrast. COMPARISON:  08/26/2019 head CT.  08/31/2019 MRI head. FINDINGS: Brain: Multifocal restricted diffusion and correlative T2 hyperintense signal involving the bilateral occipital lobes and anterior left pons. Tiny remote microhemorrhages involving the bilateral cerebellum. Moderate diffuse parenchymal volume loss. Sequela of remote bilateral occipital and cerebellar infarcts. Smaller remote infarcts are seen involving the dorsal left midbrain, bilateral centrum semiovale, bilateral thalami and left frontal white matter. No midline shift, mass lesion or extra-axial fluid collection. Vascular: FLAIR hyperintense signal within the right V4 segment may reflect slow flow versus occlusion, unchanged from prior exam. Skull and upper cervical spine: Normal marrow signal. Sinuses/Orbits: Normal orbits. Mild-to-moderate pansinus mucosal thickening with layering bilateral maxillary secretions. Trace left mastoid effusion. Other: None. IMPRESSION: Acute infarcts involving the bilateral occipital lobes and anterior left pons. Remote infarcts involving the bilateral occipital lobes, bilateral  cerebellum, dorsal left midbrain, bilateral centrum semiovale, bilateral thalami and left frontal white matter. 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 These results were called by telephone at the time of interpretation on 08/26/2019 at 5:08 pm to provider Dr. Fredderick Phenix, who verbally acknowledged these results. Electronically Signed   By: Stana Bunting M.D.   On: 08/26/2019 17:11   IR US Guide Vasc Access Right  Result Date: 08/31/2019 CLINICAL DATA:  Vertebrobasilar ischemic symptoms. Diagnosis of severe proximal basilar/dominant left vertebrobasilar junction stenosis on CT  angiogram of the head and neck. EXAM: BILATERAL COMMON CAROTID AND INNOMINATE ANGIOGRAPHY COMPARISON:  CT angiogram of the head and neck of August 27, 2019. MEDICATIONS: Heparin 2000 units IA. No antibiotic was administered within 1 hour of the procedure. ANESTHESIA/SEDATION: Versed 1 mg IV; Fentanyl 25 mcg IV Moderate Sedation Time:  47 minutes The patient was continuously monitored during the procedure by the interventional radiology nurse under my direct supervision. CONTRAST:  Isovue 300 approximately 70 cc FLUOROSCOPY TIME:  Fluoroscopy Time: 12 minutes 48 seconds (1151 mGy). COMPLICATIONS: None immediate. TECHNIQUE: Informed written consent was obtained from the patient after a thorough discussion of the procedural risks, benefits and alternatives. All questions were addressed. Maximal Sterile Barrier Technique was utilized including caps, mask, sterile gowns, sterile gloves, sterile drape, hand hygiene and skin antiseptic. A timeout was performed prior to the initiation of the procedure. The right forearm to the wrist was prepped and draped in the usual sterile manner. The right radial artery was then identified with ultrasound, and its morphology documented. A dorsal palmar anastomosis was verified to be present. Using a micropuncture set and ultrasound guidance, access into the right radial artery was  obtained over a 0.018 inch micro guidewire. A 4/5 French radial sheath was then inserted. The obturator, and the micro guidewire were removed. Good aspiration obtained from the side port of the sheath. A cocktail of 2000 units of heparin, 2.5 mg of verapamil, and 200 mcg of nitroglycerin was then infused in diluted form through the sheath. A right radial arteriogram was then performed. A 5 French Simmons 2 diagnostic catheter was then advanced to the aortic arch region, and select cannulation was performed of the left vertebral artery, the right common carotid artery, the left common carotid artery, and just proximal to the origin of the right vertebral artery. Following the procedure, hemostasis at the right radial puncture site was achieved with a wrist band. Distal right radial pulse was verified to be present. FINDINGS: The origin of the right radial artery appears to be moderately stenosed. More distally the vessel is seen to ascend to the cranial skull base with opacification of the right posterior-inferior cerebellar artery and the vertebrobasilar junction just distal to this. No contrast is seen distal to this in the basilar artery. The dominant left vertebral artery origin is widely patent. The vessel has a mild to moderate tortuosity just distal to its origin. More distally the vessel is seen to opacify to the cranial skull base. Wide patency is seen of the left vertebrobasilar junction proximal to the left posterior-inferior cerebellar artery. A severe high-grade stenosis is seen at the distal left vertebrobasilar junction, at its confluence with the basilar artery. Distal to the basilar artery the posterior cerebral arteries, the superior cerebellar arteries and the anterior-inferior cerebellar arteries opacify into the capillary and venous phases. Scattered focal areas of mild caliber irregularity is seen involving the proximal right PCA, the superior cerebellar arteries and the left posterior-inferior  cerebellar artery. The left common carotid arteriogram demonstrates mild stenosis at the origin of the left external carotid artery. Its branches opacify widely. The left internal carotid artery at the bulb appears to have a moderate-sized ulcerated plaque associated with mild stenosis. No evidence of intraluminal filling defects is seen. More distally the vessel is seen to opacify to the cranial skull base. There is mild stenosis at the petrous cavernous junction, and the caval cavernous region. More distally the cavernous and the supraclinoid segments are widely patent. The left middle cerebral  artery and the left anterior cerebral artery opacify into the capillary and venous phases. There is approximately 50% stenosis of the proximal middle cerebral artery in the M1 segment. The right common carotid arteriogram demonstrates the right external carotid artery and its major branches to be widely patent. The right internal carotid artery at the bulb has a small ulcerated plaque along the posterior wall. No evidence of significant stenosis is seen. More distally the vessel is seen to opacify to the cranial skull base. There is mild narrowing of the petrous cavernous junction. More distally the cavernous and the supraclinoid segments are widely patent. The right middle cerebral artery and the right anterior cerebral artery opacify into the capillary and venous phases. Mild stenosis of the right anterior cerebral A1 segment at its origin is noticeable. 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 PLAN: Angiographic findings reviewed with the patient and his family, and with the stroke  neurologist. Electronically Signed   By: [name] M.D.   On: 08/30/2019 10:09   DG Swallowing Func-Speech Pathology  Result Date: 08/31/2019 Objective Swallowing Evaluation: Type of Study: MBS-Modified Barium Swallow Study  Patient Details Name: Knut Rondinelli MRN: [id number] Date of Birth: [birth year] Today's Date: 08/31/2019 Time: SLP Start Time (ACUTE ONLY): 1111 -SLP Stop Time (ACUTE ONLY): 1124 SLP Time Calculation (min) (ACUTE ONLY): 13 min Past Medical History: Past Medical History: Diagnosis Date . HLD (hyperlipidemia)  . MI (myocardial infarction) (HCC)  . Stroke [facility])  Past Surgical History: Past Surgical History: Procedure Laterality Date . CORONARY STENT INTERVENTION   . IR ANGIO INTRA EXTRACRAN SEL COM CAROTID INNOMINATE BILAT MOD SED  08/29/2019 . IR ANGIO VERTEBRAL SEL SUBCLAVIAN INNOMINATE UNI R MOD SED  08/29/2019 . IR ANGIO VERTEBRAL SEL VERTEBRAL UNI L MOD SED  08/29/2019 . IR US GUIDE VASC ACCESS RIGHT  08/29/2019 HPI: [name] presenting with acute infarcts of bilateral occipital lobes and anterior L pons. Pt was admitted back in April with CVA of PCA territory as well and then again on 6/27. Pt began to experience dysequilibrium, slurred speech and difficulty walking while in [facility], went to ED but too long of a wait so came to [facility] when they came back.  Subjective: Pt was alert and cooperative Assessment / Plan / Recommendation CHL IP CLINICAL IMPRESSIONS 08/31/2019 Clinical Impression Pt presents with a mild oropharyngeal dysphagia, requiring increased time for oral preparation but with good oral control and clearance. He has mildly reduced laryngeal elevation and laryngeal vestibule closure that results in trace amounts of penetration to the vocal folds, not cleared spontaneously and only partially cleared with a cued throat clear and cough. At times, barium also seems to fall below the vocal folds in trace amounts. Occasional throat clearing was noted throughout testing  but never associated with penetration or aspiration. His airway protection is improved with use of a chin tuck, but he does need Min-Mod cues to maintain the chin tuck until he has completed his swallow. Recommend continuing regular diet and thin liquids, using a chin tuck with all liquids.  SLP Visit Diagnosis Dysphagia, unspecified (R13.10) Attention and concentration deficit following -- Frontal lobe and executive function deficit following -- Impact on safety and function Mild aspiration risk  CHL IP TREATMENT RECOMMENDATION 08/31/2019 Treatment Recommendations Therapy as outlined in treatment plan below   Prognosis 08/31/2019 Prognosis for Safe Diet Advancement Good Barriers to Reach Goals Cognitive deficits Barriers/Prognosis Comment -- [facility] IP DIET RECOMMENDATION 08/31/2019 SLP Diet Recommendations Regular solids;Thin liquid Liquid Administration via Cup;Straw Medication Administration Whole meds with puree Compensations Minimize environmental distractions;Slow rate;Small sips/bites;Chin tuck Postural Changes Seated upright at 90 degrees   [facility] IP OTHER RECOMMENDATIONS 08/31/2019 Recommended Consults -- Oral Care Recommendations Oral care BID Other Recommendations --   [facility] IP FOLLOW UP RECOMMENDATIONS 08/31/2019 Follow up Recommendations Home health SLP;24 hour supervision/assistance   [facility] IP FREQUENCY AND DURATION 08/31/2019 Speech Therapy Frequency (ACUTE ONLY) min 2x/week Treatment Duration 2 weeks      [facility] IP ORAL PHASE 08/31/2019 Oral Phase WFL Oral - Pudding Teaspoon -- Oral - Pudding Cup -- Oral - Honey Teaspoon -- Oral - Honey Cup -- Oral - Nectar Teaspoon -- Oral - Nectar Cup -- Oral - Nectar Straw -- Oral - Thin Teaspoon -- Oral - Thin Cup -- Oral - Thin Straw -- Oral - Puree -- Oral - Mech Soft -- Oral - Regular -- Oral - Multi-Consistency -- Oral - Pill -- Oral Phase - Comment --  [facility] IP PHARYNGEAL PHASE 08/31/2019 Pharyngeal Phase Impaired Pharyngeal- Pudding Teaspoon -- Pharyngeal -- Pharyngeal- Pudding  Cup -- Pharyngeal -- Pharyngeal- Honey Teaspoon -- Pharyngeal -- Pharyngeal- Honey Cup -- Pharyngeal -- Pharyngeal- Nectar Teaspoon -- Pharyngeal -- Pharyngeal- Nectar Cup -- Pharyngeal -- Pharyngeal- Nectar Straw -- Pharyngeal -- Pharyngeal- Thin Teaspoon -- Pharyngeal -- Pharyngeal- Thin Cup Reduced laryngeal elevation;Reduced airway/laryngeal closure;Penetration/Aspiration during swallow Pharyngeal Material enters airway, CONTACTS cords and not ejected out Pharyngeal- Thin Straw Reduced laryngeal elevation;Reduced airway/laryngeal closure;Penetration/Aspiration during swallow Pharyngeal Material enters airway, passes BELOW cords without attempt by patient to eject out (silent aspiration) Pharyngeal- Puree WFL Pharyngeal -- Pharyngeal- Mechanical Soft WFL Pharyngeal -- Pharyngeal- Regular -- Pharyngeal -- Pharyngeal- Multi-consistency -- Pharyngeal -- Pharyngeal- Pill -- Pharyngeal -- Pharyngeal Comment --  [facility] IP CERVICAL ESOPHAGEAL PHASE 08/31/2019 Cervical Esophageal Phase WFL Pudding Teaspoon -- Pudding Cup -- Honey Teaspoon -- Honey Cup -- Nectar Teaspoon -- Nectar Cup -- Nectar Straw -- Thin Teaspoon -- Thin Cup -- Thin Straw -- Puree -- Mechanical Soft -- Regular -- Multi-consistency -- Pill -- Cervical Esophageal Comment -- Mahala MenghiniLaura N., M.A. CCC-SLP Acute Rehabilitation Services Pager [phone] Office [phone] 08/31/2019, 12:41 PM              ECHOCARDIOGRAM COMPLETE  Result Date: 08/27/2019    ECHOCARDIOGRAM REPORT   Patient Name:   Raymond EndsROBERT Cornick Date of Exam: 08/27/2019 Medical Rec #:  [id number]    Height:       70.0 in Accession #:    [id number]   Weight:       240.0 lb Date of Birth:  06/21/1950    BSA:          2.255 m Patient Age:    68 years     BP:           141/92 mmHg Patient Gender: M            HR:           71 bpm. Exam Location:  Inpatient Procedure: 2D Echo, Cardiac Doppler and Color Doppler Indications:    Stroke 434.91 / I163.9  History:        Patient has no prior history of  Echocardiogram examinations.  Previous Myocardial Infarction; Risk Factors:Dyslipidemia.  Sonographer:    Elmarie Shiley Dance Referring Phys: [id number] AVA SWAYZE IMPRESSIONS  1. Left ventricular ejection fraction, by estimation, is 45 to 50%. The left ventricle has mildly decreased function. The left ventricle demonstrates global hypokinesis. There is mild concentric left ventricular hypertrophy. Left ventricular diastolic parameters are indeterminate.  2. Right ventricular systolic function is normal. The right ventricular size is normal.  3. The mitral valve is normal in structure. Trivial mitral valve regurgitation. No evidence of mitral stenosis.  4. The aortic valve is tricuspid. Aortic valve regurgitation is mild.  5. The inferior vena cava is normal in size with greater than 50% respiratory variability, suggesting right atrial pressure of 3 mmHg. Conclusion(s)/Recommendation(s): No intracardiac source of embolism detected on this transthoracic study. A transesophageal echocardiogram is recommended to exclude cardiac source of embolism if clinically indicated. FINDINGS  Left Ventricle: Left ventricular ejection fraction, by estimation, is 45 to 50%. The left ventricle has mildly decreased function. The left ventricle demonstrates global hypokinesis. The left ventricular internal cavity size was normal in size. There is  mild concentric left ventricular hypertrophy. Left ventricular diastolic parameters are indeterminate. Right Ventricle: The right ventricular size is normal. No increase in right ventricular wall thickness. Right ventricular systolic function is normal. Left Atrium: Left atrial size was normal in size. Right Atrium: Right atrial size was normal in size. Pericardium: Trivial pericardial effusion is present. Presence of pericardial fat pad. Mitral Valve: The mitral valve is normal in structure. Trivial mitral valve regurgitation. No evidence of mitral valve stenosis. Tricuspid Valve: The  tricuspid valve is normal in structure. Tricuspid valve regurgitation is trivial. No evidence of tricuspid stenosis. Aortic Valve: The aortic valve is tricuspid. Aortic valve regurgitation is mild. Aortic regurgitation PHT measures 745 msec. Pulmonic Valve: The pulmonic valve was not well visualized. Pulmonic valve regurgitation is not visualized. Aorta: The aortic root and ascending aorta are structurally normal, with no evidence of dilitation. Venous: The inferior vena cava is normal in size with greater than 50% respiratory variability, suggesting right atrial pressure of 3 mmHg. IAS/Shunts: No atrial level shunt detected by color flow Doppler.  LEFT VENTRICLE PLAX 2D LVIDd:         4.40 cm  Diastology LVIDs:         3.40 cm  LV e' lateral:   4.03 cm/s LV PW:         1.20 cm  LV E/e' lateral: 10.4 LV IVS:        1.40 cm  LV e' medial:    4.79 cm/s LVOT diam:     2.10 cm  LV E/e' medial:  8.8 LV SV:         50 LV SV Index:   22 LVOT Area:     3.46 cm  RIGHT VENTRICLE             IVC RV Basal diam:  2.70 cm     IVC diam: 1.40 cm RV S prime:     10.00 cm/s TAPSE (M-mode): 2.0 cm LEFT ATRIUM             Index       RIGHT ATRIUM          Index LA diam:        4.70 cm 2.08 cm/m  RA Area:     8.83 cm LA Vol (A2C):   53.5 ml 23.72 ml/m RA Volume:   12.50 ml 5.54 ml/m LA Vol (A4C):  34.9 ml 15.47 ml/m LA Biplane Vol: 43.7 ml 19.37 ml/m  AORTIC VALVE LVOT Vmax:   79.15 cm/s LVOT Vmean:  51.500 cm/s LVOT VTI:    0.146 m AI PHT:      745 msec  AORTA Ao Root diam: 3.80 cm Ao Asc diam:  3.75 cm MITRAL VALVE MV Area (PHT): 2.73 cm    SHUNTS MV Decel Time: 278 msec    Systemic VTI:  0.15 m MV E velocity: 42.10 cm/s  Systemic Diam: 2.10 cm MV A velocity: 96.80 cm/s MV E/A ratio:  0.43 Jodelle Red MD Electronically signed by Jodelle Red MD Signature Date/Time: 08/27/2019/2:58:02 PM    Final    IR ANGIO INTRA EXTRACRAN SEL COM CAROTID INNOMINATE BILAT MOD SED  Result Date: 08/31/2019 CLINICAL DATA:   Vertebrobasilar ischemic symptoms. 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 EXAM: BILATERAL COMMON CAROTID AND INNOMINATE ANGIOGRAPHY COMPARISON:  CT angiogram of the head and neck of August 27, 2019. MEDICATIONS: Heparin 2000 units IA. No antibiotic was administered within 1 hour of the procedure. ANESTHESIA/SEDATION: Versed 1 mg IV; Fentanyl 25 mcg IV Moderate Sedation Time:  47 minutes The patient was continuously monitored during the procedure by the interventional radiology nurse under my direct supervision. CONTRAST:  Isovue 300 approximately 70 cc FLUOROSCOPY TIME:  Fluoroscopy Time: 12 minutes 48 seconds (1151 mGy). COMPLICATIONS: None immediate. TECHNIQUE: Informed written consent was obtained from the patient after a thorough discussion of the procedural risks, benefits and alternatives. All questions were addressed. Maximal Sterile Barrier Technique was utilized including caps, mask, sterile gowns, sterile gloves, sterile drape, hand hygiene and skin antiseptic. A timeout was performed prior to the initiation of the procedure. The right forearm to the wrist was prepped and draped in the usual sterile manner. The right radial artery was then identified with ultrasound, and its morphology documented. A dorsal palmar anastomosis was verified to be present. Using a micropuncture set and ultrasound guidance, access into the right radial artery was obtained over a 0.018 inch micro guidewire. A 4/5 French radial sheath was then inserted. The obturator, and the micro guidewire were removed. Good aspiration obtained from the side port of the sheath. A cocktail of 2000 units of heparin, 2.5 mg of verapamil, and 200 mcg of nitroglycerin was then infused in diluted form through the sheath. A right radial arteriogram was then performed. A 5 French Simmons 2 diagnostic catheter was then advanced to the aortic arch region, and select  cannulation was performed of the left vertebral artery, the right common carotid artery, the left common carotid artery, and just proximal to the origin of the right vertebral artery. Following the procedure, hemostasis at the right radial puncture site was achieved with a wrist band. Distal right radial pulse was verified to be present. FINDINGS: The origin of the right radial artery appears to be moderately stenosed. More distally the vessel is seen to ascend to the cranial skull base with opacification of the right posterior-inferior cerebellar artery and the vertebrobasilar junction just distal to this. No contrast is seen distal to this in the basilar artery. The dominant left vertebral artery origin is widely patent. The vessel has a mild to moderate tortuosity just distal to its origin. More distally the vessel is seen to opacify to the cranial skull base. Wide patency is seen of the left vertebrobasilar junction proximal to the left posterior-inferior cerebellar artery. A severe high-grade stenosis is seen at the  distal left vertebrobasilar junction, at its confluence with the basilar artery. Distal to the basilar artery the posterior cerebral arteries, the superior cerebellar arteries and the anterior-inferior cerebellar arteries opacify into the capillary and venous phases. Scattered focal areas of mild caliber irregularity is seen involving the proximal right PCA, the superior cerebellar arteries and the left posterior-inferior cerebellar artery. The left common carotid arteriogram demonstrates mild stenosis at the origin of the left external carotid artery. Its branches opacify widely. The left internal carotid artery at the bulb appears to have a moderate-sized ulcerated plaque associated with mild stenosis. No evidence of intraluminal filling defects is seen. More distally the vessel is seen to opacify to the cranial skull base. There is mild stenosis at the petrous cavernous junction, and the caval  cavernous region. More distally the cavernous and the supraclinoid segments are widely patent. The left middle cerebral artery and the left anterior cerebral artery opacify into the capillary and venous phases. There is approximately 50% stenosis of the proximal middle cerebral artery in the M1 segment. The right common carotid arteriogram demonstrates the right external carotid artery and its major branches to be widely patent. The right internal carotid artery at the bulb has a small ulcerated plaque along the posterior wall. No evidence of significant stenosis is seen. More distally the vessel is seen to opacify to the cranial skull base. There is mild narrowing of the petrous cavernous junction. More distally the cavernous and the supraclinoid segments are widely patent. The right middle cerebral artery and the right anterior cerebral artery opacify into the capillary and venous phases. Mild stenosis of the right anterior cerebral A1 segment at its origin is noticeable. 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 PLAN: Angiographic findings reviewed with the patient and his family, and with the stroke neurologist. Electronically Signed   By: [name] M.D.   On: 08/30/2019 10:09   IR ANGIO VERTEBRAL SEL SUBCLAVIAN INNOMINATE UNI R MOD SED  Result Date: 08/31/2019 CLINICAL DATA:  Vertebrobasilar ischemic symptoms. 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 EXAM: BILATERAL COMMON CAROTID AND INNOMINATE ANGIOGRAPHY COMPARISON:  CT angiogram of the head and neck of August 27, 2019. MEDICATIONS:  Heparin 2000 units IA. No antibiotic was administered within 1 hour of the procedure. ANESTHESIA/SEDATION: Versed 1 mg IV; Fentanyl 25 mcg IV Moderate Sedation Time:  47 minutes The patient was continuously monitored during the procedure by the interventional radiology nurse under my direct supervision. CONTRAST:  Isovue 300 approximately 70 cc FLUOROSCOPY TIME:  Fluoroscopy Time: 12 minutes 48 seconds (1151 mGy). COMPLICATIONS: None immediate. TECHNIQUE: Informed written consent was obtained from the patient after a thorough discussion of the procedural risks, benefits and alternatives. All questions were addressed. Maximal Sterile Barrier Technique was utilized including caps, mask, sterile gowns, sterile gloves, sterile drape, hand hygiene and skin antiseptic. A timeout was performed prior to the initiation of the procedure. The right forearm to the wrist was prepped and draped in the usual sterile manner. The right radial artery was then identified with ultrasound, and its morphology documented. A dorsal palmar anastomosis was verified to be present. Using a micropuncture set and ultrasound guidance, access into the right radial artery  was obtained over a 0.018 inch micro guidewire. A 4/5 French radial sheath was then inserted. The obturator, and the micro guidewire were removed. Good aspiration obtained from the side port of the sheath. A cocktail of 2000 units of heparin, 2.5 mg of verapamil, and 200 mcg of nitroglycerin was then infused in diluted form through the sheath. A right radial arteriogram was then performed. A 5 French Simmons 2 diagnostic catheter was then advanced to the aortic arch region, and select cannulation was performed of the left vertebral artery, the right common carotid artery, the left common carotid artery, and just proximal to the origin of the right vertebral artery. Following the procedure, hemostasis at the right radial puncture site was achieved with a wrist band. Distal right  radial pulse was verified to be present. FINDINGS: The origin of the right radial artery appears to be moderately stenosed. More distally the vessel is seen to ascend to the cranial skull base with opacification of the right posterior-inferior cerebellar artery and the vertebrobasilar junction just distal to this. No contrast is seen distal to this in the basilar artery. The dominant left vertebral artery origin is widely patent. The vessel has a mild to moderate tortuosity just distal to its origin. More distally the vessel is seen to opacify to the cranial skull base. Wide patency is seen of the left vertebrobasilar junction proximal to the left posterior-inferior cerebellar artery. A severe high-grade stenosis is seen at the distal left vertebrobasilar junction, at its confluence with the basilar artery. Distal to the basilar artery the posterior cerebral arteries, the superior cerebellar arteries and the anterior-inferior cerebellar arteries opacify into the capillary and venous phases. Scattered focal areas of mild caliber irregularity is seen involving the proximal right PCA, the superior cerebellar arteries and the left posterior-inferior cerebellar artery. The left common carotid arteriogram demonstrates mild stenosis at the origin of the left external carotid artery. Its branches opacify widely. The left internal carotid artery at the bulb appears to have a moderate-sized ulcerated plaque associated with mild stenosis. No evidence of intraluminal filling defects is seen. More distally the vessel is seen to opacify to the cranial skull base. There is mild stenosis at the petrous cavernous junction, and the caval cavernous region. More distally the cavernous and the supraclinoid segments are widely patent. The left middle cerebral artery and the left anterior cerebral artery opacify into the capillary and venous phases. There is approximately 50% stenosis of the proximal middle cerebral artery in the M1  segment. The right common carotid arteriogram demonstrates the right external carotid artery and its major branches to be widely patent. The right internal carotid artery at the bulb has a small ulcerated plaque along the posterior wall. No evidence of significant stenosis is seen. More distally the vessel is seen to opacify to the cranial skull base. There is mild narrowing of the petrous cavernous junction. More distally the cavernous and the supraclinoid segments are widely patent. The right middle cerebral artery and the right anterior cerebral artery opacify into the capillary and venous phases. Mild stenosis of the right anterior cerebral A1 segment at its origin is noticeable. 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 PLAN: Angiographic findings reviewed with the patient and his family, and with  the stroke neurologist. Electronically Signed   By: [name] M.D.   On: 08/30/2019 10:09   IR ANGIO VERTEBRAL SEL VERTEBRAL UNI L MOD SED  Result Date: 08/31/2019 CLINICAL DATA:  Vertebrobasilar ischemic symptoms. 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 EXAM: BILATERAL COMMON CAROTID AND INNOMINATE ANGIOGRAPHY COMPARISON:  CT angiogram of the head and neck of August 27, 2019. MEDICATIONS: Heparin 2000 units IA. No antibiotic was administered within 1 hour of the procedure. ANESTHESIA/SEDATION: Versed 1 mg IV; Fentanyl 25 mcg IV Moderate Sedation Time:  47 minutes The patient was continuously monitored during the procedure by the interventional radiology nurse under my direct supervision.  CONTRAST:  Isovue 300 approximately 70 cc FLUOROSCOPY TIME:  Fluoroscopy Time: 12 minutes 48 seconds (1151 mGy). COMPLICATIONS: None immediate. TECHNIQUE: Informed written consent was obtained from the patient after a thorough discussion of the procedural risks, benefits and alternatives. All questions were addressed. Maximal Sterile Barrier Technique was utilized including caps, mask, sterile gowns, sterile gloves, sterile drape, hand hygiene and skin antiseptic. A timeout was performed prior to the initiation of the procedure. The right forearm to the wrist was prepped and draped in the usual sterile manner. The right radial artery was then identified with ultrasound, and its morphology documented. A dorsal palmar anastomosis was verified to be present. Using a micropuncture set and ultrasound guidance, access into the right radial artery was obtained over a 0.018 inch micro guidewire. A 4/5 French radial sheath was then inserted. The obturator, and the micro guidewire were removed. Good aspiration obtained from the side port of the sheath. A cocktail of 2000 units of heparin, 2.5 mg of verapamil, and 200 mcg of nitroglycerin was then infused in diluted form through the sheath. A right radial arteriogram was then performed. A 5 French Simmons 2 diagnostic catheter was then advanced to the aortic arch region, and select cannulation was performed of the left vertebral artery, the right common carotid artery, the left common carotid artery, and just proximal to the origin of the right vertebral artery. Following the procedure, hemostasis at the right radial puncture site was achieved with a wrist band. Distal right radial pulse was verified to be present. FINDINGS: The origin of the right radial artery appears to be moderately stenosed. More distally the vessel is seen to ascend to the cranial skull base with opacification of the right posterior-inferior cerebellar artery and the vertebrobasilar junction just distal  to this. No contrast is seen distal to this in the basilar artery. The dominant left vertebral artery origin is widely patent. The vessel has a mild to moderate tortuosity just distal to its origin. More distally the vessel is seen to opacify to the cranial skull base. Wide patency is seen of the left vertebrobasilar junction proximal to the left posterior-inferior cerebellar artery. A severe high-grade stenosis is seen at the distal left vertebrobasilar junction, at its confluence with the basilar artery. Distal to the basilar artery the posterior cerebral arteries, the superior cerebellar arteries and the anterior-inferior cerebellar arteries opacify into the capillary and venous phases. Scattered focal areas of mild caliber irregularity is seen involving the proximal right PCA, the superior cerebellar arteries and the left posterior-inferior cerebellar artery. The left common carotid arteriogram demonstrates mild stenosis at the origin of the left external carotid artery. Its branches opacify widely. The left internal carotid artery at the bulb appears to have a moderate-sized ulcerated plaque associated with mild stenosis. No  evidence of intraluminal filling defects is seen. More distally the vessel is seen to opacify to the cranial skull base. There is mild stenosis at the petrous cavernous junction, and the caval cavernous region. More distally the cavernous and the supraclinoid segments are widely patent. The left middle cerebral artery and the left anterior cerebral artery opacify into the capillary and venous phases. There is approximately 50% stenosis of the proximal middle cerebral artery in the M1 segment. The right common carotid arteriogram demonstrates the right external carotid artery and its major branches to be widely patent. The right internal carotid artery at the bulb has a small ulcerated plaque along the posterior wall. No evidence of significant stenosis is seen. More distally the vessel is  seen to opacify to the cranial skull base. There is mild narrowing of the petrous cavernous junction. More distally the cavernous and the supraclinoid segments are widely patent. The right middle cerebral artery and the right anterior cerebral artery opacify into the capillary and venous phases. Mild stenosis of the right anterior cerebral A1 segment at its origin is noticeable. 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 PLAN: Angiographic findings reviewed with the patient and his family, and with the stroke neurologist. Electronically Signed   By: [name] M.D.   On: 08/30/2019 10:09      Procedures: VBG stenting on the left    Subjective: Patient is feeling better, no nausea or vomiting, no further confusion or agitation.   Discharge Exam: Vitals:   09/03/19 0400 09/03/19 0911  BP: (!) 137/90 (!) 156/79  Pulse: 88 97  Resp: 16 20  Temp: 97.6 F (36.4 C) 97.7 F (36.5 C)  SpO2: 95% 93%   Vitals:   09/02/19 1939 09/03/19 0015 09/03/19 0400 09/03/19 0911  BP: (!) 129/69 (!) 132/79 (!) 137/90 (!) 156/79  Pulse: 86 83 88 97  Resp: 18 18 16 20   Temp: 97.6 F (36.4 C) 97.7 F (36.5 C) 97.6 F (36.4 C) 97.7 F (36.5 C)  TempSrc: Oral Oral Oral Oral  SpO2: 92% 95% 95% 93%  Weight:      Height:        General: Not in pain or dyspnea.  Neurology: Awake and alert, non focal  E ENT: no pallor, no icterus, oral mucosa moist Cardiovascular: No JVD. S1-S2 present, rhythmic, no gallops, rubs, or murmurs. No lower extremity edema. Pulmonary: positive breath sounds bilaterally, adequate air movement, no wheezing, rhonchi or  rales. Gastrointestinal. Abdomen soft and non tender Skin. Left upper trapezius muscle ecchymosis. Right groin with dressing in place, no signs of bleeding.  Musculoskeletal: no joint deformities   The results of significant diagnostics from this hospitalization (including imaging, microbiology, ancillary and laboratory) are listed below for reference.     Microbiology: Recent Results (from the past 240 hour(s))  SARS Coronavirus 2 by RT PCR (hospital order, performed in [facility] hospital lab) Nasopharyngeal Nasopharyngeal Swab     Status: None   Collection Time: 08/26/19  3:00 PM   Specimen: Nasopharyngeal Swab  Result Value Ref Range Status   SARS Coronavirus 2 NEGATIVE NEGATIVE Final    Comment: (NOTE) SARS-CoV-2 target nucleic acids are NOT DETECTED.  The SARS-CoV-2 RNA is generally detectable in upper and lower respiratory  specimens during the acute phase of infection. The lowest concentration of SARS-CoV-2 viral copies this assay can detect is 250 copies / mL. A negative result does not preclude SARS-CoV-2 infection and should not be used as the sole basis for treatment or other patient management decisions.  A negative result may occur with improper specimen collection / handling, submission of specimen other than nasopharyngeal swab, presence of viral mutation(s) within the areas targeted by this assay, and inadequate number of viral copies (<250 copies / mL). A negative result must be combined with clinical observations, patient history, and epidemiological information.  Fact Sheet for Patients:   BoilerBrush.com.cy  Fact Sheet for Healthcare Providers: https://pope.com/  This test is not yet approved or  cleared by the [place] FDA and has been authorized for detection and/or diagnosis of SARS-CoV-2 by FDA under an Emergency Use Authorization (EUA).  This EUA will remain in effect (meaning this test can be used)  for the duration of the COVID-19 declaration under Section 564(b)(1) of the Act, 21 U.S.C. section 360bbb-3(b)(1), unless the authorization is terminated or revoked sooner.  Performed at [facility], [address] [place], [place] [address]   MRSA PCR Screening     Status: None   Collection Time: 09/01/19  6:58 PM   Specimen: Nasal Mucosa; Nasopharyngeal  Result Value Ref Range Status   MRSA by PCR NEGATIVE NEGATIVE Final    Comment:        The GeneXpert MRSA Assay (FDA approved for NASAL specimens only), is one component of a comprehensive MRSA colonization surveillance program. It is not intended to diagnose MRSA infection nor to guide or monitor treatment for MRSA infections. Performed at [facility], [address], [place], [place] [address]      Labs: BNP (last 3 results) No results for input(s): BNP in the last 8760 hours. Basic Metabolic Panel: Recent Labs  Lab 08/27/19 1815 08/27/19 1815 08/28/19 0407 08/28/19 0407 08/30/19 0606 08/31/19 0826 09/01/19 0357 09/02/19 1050 09/03/19 0259  NA 138   < > 139   < > 139 137 138 138 139  K 3.7   < > 3.5   < > 3.9 3.6 3.5 3.5 3.2*  CL 102   < > 105   < > 104 102 104 108 107  CO2 25   < > 25   < > 25 26 23  21* 22  GLUCOSE 129*   < > 114*   < > 126* 192* 129* 144* 112*  BUN 15   < > 14   < > 13 15 17 17 18   CREATININE 1.35*   < > 1.44*   < > 1.42* 1.54* 1.44* 1.45* 1.49*  CALCIUM 8.9   < > 8.9   < > 9.0 9.4 9.0 8.8* 8.6*  MG 1.8  --  2.0  --  2.0 2.0  --   --   --   PHOS 3.6  --  3.5  --  3.0 2.8  --   --   --    < > = values in this interval not displayed.   Liver Function Tests: Recent Labs  Lab 08/27/19 1815 08/28/19 0407 08/30/19 0606 08/31/19 0826  AST 24 21 30 31   ALT 23 19 29 31   ALKPHOS 122 112 121 134*  BILITOT 0.8 0.8 0.9 1.4*  PROT 6.8 6.3* 7.2 7.3  ALBUMIN 3.2* 3.0* 3.5 3.5   No results for input(s): LIPASE, AMYLASE in the last  168 hours. No results for input(s): AMMONIA in  the last 168 hours. CBC: Recent Labs  Lab 08/27/19 1815 08/27/19 1815 08/28/19 0407 08/30/19 0606 08/31/19 0826 09/02/19 1050 09/03/19 0259  WBC 6.2   < > 5.8 8.1 7.2 11.9* 11.2*  NEUTROABS 3.8  --  3.3 5.5 5.3 10.1*  --   HGB 13.1   < > 12.8* 14.1 14.3 11.7* 11.9*  HCT 37.9*   < > 37.2* 41.4 41.2 34.4* 34.6*  MCV 92.4   < > 92.3 93.5 93.0 93.7 96.1  PLT 245   < > 220 292 322 276 307   < > = values in this interval not displayed.   Cardiac Enzymes: No results for input(s): CKTOTAL, CKMB, CKMBINDEX, TROPONINI in the last 168 hours. BNP: Invalid input(s): POCBNP CBG: Recent Labs  Lab 09/02/19 1546 09/02/19 2056 09/03/19 0114 09/03/19 0406 09/03/19 0838  GLUCAP 119* 143* 129* 123* 124*   D-Dimer No results for input(s): DDIMER in the last 72 hours. Hgb A1c No results for input(s): HGBA1C in the last 72 hours. Lipid Profile No results for input(s): CHOL, HDL, LDLCALC, TRIG, CHOLHDL, LDLDIRECT in the last 72 hours. Thyroid function studies No results for input(s): TSH, T4TOTAL, T3FREE, THYROIDAB in the last 72 hours.  Invalid input(s): FREET3 Anemia work up No results for input(s): VITAMINB12, FOLATE, FERRITIN, TIBC, IRON, RETICCTPCT in the last 72 hours. Urinalysis No results found for: COLORURINE, APPEARANCEUR, LABSPEC, PHURINE, GLUCOSEU, HGBUR, BILIRUBINUR, KETONESUR, PROTEINUR, UROBILINOGEN, NITRITE, LEUKOCYTESUR Sepsis Labs Invalid input(s): PROCALCITONIN,  WBC,  LACTICIDVEN Microbiology Recent Results (from the past 240 hour(s))  SARS Coronavirus 2 by RT PCR (hospital order, performed in [facility] hospital lab) Nasopharyngeal Nasopharyngeal Swab     Status: None   Collection Time: 08/26/19  3:00 PM   Specimen: Nasopharyngeal Swab  Result Value Ref Range Status   SARS Coronavirus 2 NEGATIVE NEGATIVE Final    Comment: (NOTE) SARS-CoV-2 target nucleic acids are NOT DETECTED.  The SARS-CoV-2 RNA is generally detectable in upper and lower respiratory  specimens during the acute phase of infection. The lowest concentration of SARS-CoV-2 viral copies this assay can detect is 250 copies / mL. A negative result does not preclude SARS-CoV-2 infection and should not be used as the sole basis for treatment or other patient management decisions.  A negative result may occur with improper specimen collection / handling, submission of specimen other than nasopharyngeal swab, presence of viral mutation(s) within the areas targeted by this assay, and inadequate number of viral copies (<250 copies / mL). A negative result must be combined with clinical observations, patient history, and epidemiological information.  Fact Sheet for Patients:   BoilerBrush.com.cy  Fact Sheet for Healthcare Providers: https://pope.com/  This test is not yet approved or  cleared by the [place] FDA and has been authorized for detection and/or diagnosis of SARS-CoV-2 by FDA under an Emergency Use Authorization (EUA).  This EUA will remain in effect (meaning this test can be used) for the duration of the COVID-19 declaration under Section 564(b)(1) of the Act, 21 U.S.C. section 360bbb-3(b)(1), unless the authorization is terminated or revoked sooner.  Performed at [facility], [address], [place], [place] [address]   MRSA PCR Screening     Status: None   Collection Time: 09/01/19  6:58 PM   Specimen: Nasal Mucosa; Nasopharyngeal  Result Value Ref Range Status   MRSA by PCR NEGATIVE NEGATIVE Final    Comment:  The GeneXpert MRSA Assay (FDA approved for NASAL specimens only), is one component of a comprehensive MRSA colonization surveillance program. It is not intended to diagnose MRSA infection nor to guide or monitor treatment for MRSA infections. Performed at [facility], [address], [place], [place] [address]      Time coordinating discharge: 45  minutes  SIGNED:   Coralie Keens, MD  [facility] 09/03/2019, 10:43 AM

## 2019-09-03 NOTE — Progress Notes (Signed)
Occupational Therapy Treatment and Discharge Patient Details Name: [name] MRN: [id number] DOB: 28-Jun-1950 Today's Date: 09/03/2019    History of present illness Pt is a 69 y.o. male presenting 08/26/19 with onset of dysequilibrium, slurred speech and difficulty walking while in [facility], went to ED but too long of a wait so came to [facility] when they came back. 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 S/p diagnostic cerebral arteriogram 7/26 revealing severe symptomatic basilar artery stenosis, thought to be cause of recent CVA. Pt underwent vertebro-basilar junction stenting 7/29 with post-op agitation.    OT comments  This 69 yo male admitted with above seen today to talk with pt and wife about services for the blind and aging gracefully program as well as to see how he was mobilizing today as far as being able to go home. He had increased A need for bed mobility (did better with rolling and up to sit than supine to sit) and increased A needed for LBD due to he feels he is having a gout flare up. Overall for mobility he is min A (did recommend to pt, wife and son that son be with him for up steps into their home). Wife has gait belt at home she can use with patient. No further acute OT needs we will sign off due to patient D/C'ing home today.  Follow Up Recommendations  Home health OT;Supervision/Assistance - 24 hour    Equipment Recommendations  None recommended by OT       Precautions / Restrictions Precautions Precautions: Fall Restrictions Weight Bearing Restrictions: No       Mobility Bed Mobility Overal bed mobility: Needs Assistance Bed Mobility: Rolling;Sidelying to Sit;Supine to Sit;Sit to Sidelying Rolling: Min guard (VCs for sequencing) Sidelying to sit: Min assist (for  trunk) Supine to sit: Mod assist (for legs and trunk)   Sit to sidelying: Mod  assist (for legs) General bed mobility comments: significant increase in time and use of rails  Transfers Overall transfer level: Needs assistance Equipment used: Rolling walker (2 wheeled) Transfers: Sit to/from Stand Sit to Stand: Min assist         General transfer comment: increased time    Balance Overall balance assessment: Needs assistance Sitting-balance support: Feet supported;No upper extremity supported Sitting balance-Leahy Scale: Fair     Standing balance support: Bilateral upper extremity supported;During functional activity Standing balance-Leahy Scale: Poor Standing balance comment: reliant on BUE support and intermittent additional external support of therapist                           ADL either performed or assessed with clinical judgement   ADL Overall ADL's : Needs assistance/impaired   Eating/Feeding Details (indicate cue type and reason): Spoke with patient and wife about how to setup patient's food for him for him to be Mod independent             Upper Body Dressing : Set up;Supervision/safety;Sitting Upper Body Dressing Details (indicate cue type and reason): EOB Lower Body Dressing: Moderate assistance Lower Body Dressing Details (indicate cue type and reason): for shorts and slip on shoes Toilet Transfer: Minimal assistance;Ambulation;RW Toilet Transfer Details (indicate cue type and reason): bed>door>bed           General ADL Comments: pt's wife given handouts on services for blind and aging gracefully  Vision   Additional Comments: He report he can see the outline of objects, but not detail          Cognition Arousal/Alertness: Awake/alert Behavior During Therapy: WFL for tasks assessed/performed Overall Cognitive Status: Impaired/Different from baseline Area of Impairment: Problem solving                             Problem Solving: Slow processing;Difficulty sequencing;Requires verbal  cues;Requires tactile cues General Comments: significantly affected by visual deficits                   Pertinent Vitals/ Pain       Pain Assessment: Faces Faces Pain Scale: Hurts little more Pain Location: right knee (he thinks he has gout flare) Pain Descriptors / Indicators: Sore Pain Intervention(s): Limited activity within patient's tolerance;Monitored during session;Repositioned         Frequency  Min 2X/week        Progress Toward Goals  OT Goals(current goals can now be found in the care plan section)  Progress towards OT goals: Not progressing toward goals - comment (Pt reprots he is having a gout flare up in [place])  Acute Rehab OT Goals Patient Stated Goal: to be able to do more for himself OT Goal Formulation: With patient Time For Goal Achievement: 09/10/19 Potential to Achieve Goals: Good  Plan Discharge plan remains appropriate       AM-PAC OT "6 Clicks" Daily Activity     Outcome Measure   Help from another person eating meals?: A Little Help from another person taking care of personal grooming?: A Little Help from another person toileting, which includes using toliet, bedpan, or urinal?: A Lot Help from another person bathing (including washing, rinsing, drying)?: A Lot Help from another person to put on and taking off regular upper body clothing?: A Little Help from another person to put on and taking off regular lower body clothing?: A Lot 6 Click Score: 15    End of Session Equipment Utilized During Treatment: Gait belt;Rolling walker  OT Visit Diagnosis: Unsteadiness on feet (R26.81);Low vision, both eyes (H54.2) Symptoms and signs involving cognitive functions: Cerebral infarction   Activity Tolerance Patient tolerated treatment well   Patient Left with family/visitor present (sitting EOB)   Nurse Communication          Time: 6433-2951 OT Time Calculation (min): 49 min  Charges: OT General Charges $OT Visit: 1 Visit OT  Treatments $Self Care/Home Management : 38-52 mins  Ignacia Palma, OTR/L Acute [employer] Pager [phone] Office [phone]      Evette Georges 09/03/2019, 2:09 PM

## 2019-09-05 ENCOUNTER — Other Ambulatory Visit (HOSPITAL_COMMUNITY): Payer: Self-pay | Admitting: Interventional Radiology

## 2019-09-05 DIAGNOSIS — I639 Cerebral infarction, unspecified: Secondary | ICD-10-CM

## 2019-09-29 ENCOUNTER — Ambulatory Visit (HOSPITAL_COMMUNITY)
Admission: RE | Admit: 2019-09-29 | Discharge: 2019-09-29 | Disposition: A | Discharge status: Home / Self Care | Payer: Medicare HMO | Source: Ambulatory Visit | Attending: Interventional Radiology | Admitting: Interventional Radiology

## 2019-09-29 ENCOUNTER — Other Ambulatory Visit: Payer: Self-pay

## 2019-09-29 DIAGNOSIS — I639 Cerebral infarction, unspecified: Secondary | ICD-10-CM

## 2019-09-29 NOTE — Consult Note (Signed)
Chief Complaint: Patient was seen in consultation today for follow-up to endovascular treatment of symptomatic vertebrobasilar artery stenosis. Referring Physician(s): dr Pearlean Brownie  History of Present Illness: [name] is a 69 y.o. 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 The patient returns today accompanied by his spouse.  Following the procedure, the patient was discharged to a rehab facility for 2weeks.  Presently the patient is at home with his wife.  There is a one-time per week physical therapy session.  The spouse reports significant improvement in terms of mobility, and overall wellbeing.  The main issue at the present time is that of difficulty with vision which according to the patient is blurred.  No history of diplopia or blindness.  No history of recent headaches nausea vomiting motor weakness, tinnitus, hearing loss, perioral numbness, difficulty swallowing.  Speech near normal in terms of clarity.  The patient is able to accomplish most of his daily routine activities except for visual blurring.  The patient reports being unable to read or to watch television because of blurred vision which he describes the images being blurred at the edges.  The patient is able to ambulate with a cane without difficulty. Constitutionally, the patient is able to sleep at night without difficulty.  His appetite is normal.  His weight is steady.  He denies any pathologic symptomatology pertaining to cardiovascular respiratory GU GI systems otherwise.  He denies any recent chills fever rigors.    Past Medical History:  Diagnosis Date  . HLD (hyperlipidemia)   . MI (myocardial infarction) (HCC)   . Stroke [facility])     Past Surgical History:  Procedure Laterality Date  . CORONARY STENT INTERVENTION    . IR ANGIO INTRA  EXTRACRAN SEL COM CAROTID INNOMINATE BILAT MOD SED  08/29/2019  . IR ANGIO VERTEBRAL SEL SUBCLAVIAN INNOMINATE UNI R MOD SED  08/29/2019  . IR ANGIO VERTEBRAL SEL VERTEBRAL UNI L MOD SED  08/29/2019  . IR INTRA CRAN STENT  09/01/2019  . IR US GUIDE VASC ACCESS RIGHT  08/29/2019  . RADIOLOGY WITH ANESTHESIA N/A 09/01/2019   Procedure: IR WITH ANESTHESIA BASILAR ARTERY STENTING;  Surgeon: [name], MD;  Location: [facility] OR;  Service: Radiology;  Laterality: N/A;    Allergies: Patient has no known allergies.  Medications: Prior to Admission medications   Medication Sig Start Date End Date Taking? Authorizing Provider  amLODipine (NORVASC) 2.5 MG tablet Take 2.5 mg by mouth daily. 08/17/19   [provider]  aspirin EC 81 MG tablet Take 81 mg by mouth daily. Swallow whole.    [provider]  atorvastatin (LIPITOR) 80 MG tablet Take 80 mg by mouth daily at 6 PM.  08/17/19   [provider]  carvedilol (COREG) 3.125 MG tablet Take 3.125 mg by mouth 2 (two) times daily. 07/07/19   [provider]  febuxostat (ULORIC) 40 MG tablet Take 40 mg by mouth daily. 08/17/19   [provider]  FLUoxetine (PROZAC) 10 MG capsule Take 10 mg by mouth daily. 08/17/19   [provider]  Misc Natural Products (TART CHERRY ADVANCED PO) Take 1,200 mg by mouth daily.    [provider]  MITIGARE 0.6 MG CAPS Take 0.6-1.2 mg by mouth 2 (two) times daily as needed (gout flareup).  06/10/19   [provider]  Multiple Vitamins-Minerals (ONE-A-DAY MENS 50+ PO) Take 1 tablet by  mouth daily.    [provider]  polyethylene glycol (MIRALAX / GLYCOLAX) 17 g packet Take 17 g by mouth daily as needed for moderate constipation. 09/03/19   Arrien, York RamMauricio Daniel, MD  ticagrelor (BRILINTA) 90 MG TABS tablet Take 1 tablet (90 mg total) by mouth 2 (two) times daily. 09/03/19 10/03/19  Arrien, York RamMauricio Daniel, MD     No family history on file.  Social  History   Socioeconomic History  . Marital status: Married    Spouse name: Not on file  . Number of children: Not on file  . Years of education: Not on file  . Highest education level: Not on file  Occupational History  . Not on file  Tobacco Use  . Smoking status: Never Smoker  . Smokeless tobacco: Never Used  Substance and Sexual Activity  . Alcohol use: Not Currently  . Drug use: Never  . Sexual activity: Not on file  Other Topics Concern  . Not on file  Social History Narrative  . Not on file   Social Determinants of Health   Financial Resource Strain:   . Difficulty of Paying Living Expenses: Not on file  Food Insecurity:   . Worried About Programme researcher, broadcasting/film/videounning Out of Food in the Last Year: Not on file  . Ran Out of Food in the Last Year: Not on file  Transportation Needs:   . Lack of Transportation (Medical): Not on file  . Lack of Transportation (Non-Medical): Not on file  Physical Activity:   . Days of Exercise per Week: Not on file  . Minutes of Exercise per Session: Not on file  Stress:   . Feeling of Stress : Not on file  Social Connections:   . Frequency of Communication with Friends and Family: Not on file  . Frequency of Social Gatherings with Friends and Family: Not on file  . Attends Religious Services: Not on file  . Active Member of Clubs or Organizations: Not on file  . Attends BankerClub or Organization Meetings: Not on file  . Marital Status: Not on file     Review of Systems: A 12 point ROS discussed and pertinent positives are indicated in the HPI above.  All other systems are negative.  Review of Systems.  Pertinent as above otherwise negative for pathologic symptomatology.  Vital Signs: There were no vitals taken for this visit.  Physical Exam repair patient is in no acute distress.  Affect normal..  Pertinent neurological finding is of decreased finger-to-nose on the right side.  Also on gross visual field testing, the patient has decreased ability to  identify moving fingers in the medial nasal regions bilaterally.  Patient ambulates with a cane without difficulty.  Is able to turn without any symptoms of dizziness vertigo or loss of balance.  Imaging: IR Intra Cran Stent  Result Date: 09/05/2019 CLINICAL DATA:  Patient with symptomatic severe dominant vertebrobasilar stenosis with recurrent strokes. EXAM: INTRACRANIAL STENT (INCL PTA) COMPARISON:  Diagnostic arteriogram of August 29, 2019. MEDICATIONS: Heparin 3,000 units IV. Ancef 2 g IV antibiotic was administered within 1 hour of the procedure. ANESTHESIA/SEDATION: General anesthesia CONTRAST:  Isovue 300 approximately 150 mL FLUOROSCOPY TIME:  Fluoroscopy Time: 44 minutes 48 seconds (4705 mGy). COMPLICATIONS: None immediate. TECHNIQUE: Informed written consent was obtained from the patient after a thorough discussion of the procedural risks, benefits and alternatives. All questions were addressed. Maximal Sterile Barrier Technique was utilized including caps, mask, sterile gowns, sterile gloves, sterile drape, hand hygiene and skin antiseptic.  A timeout was performed prior to the initiation of the procedure. The right groin was prepped and draped in the usual sterile fashion. Thereafter using modified Seldinger technique, transfemoral access into the right common femoral artery was obtained without difficulty. Over a 0.035 inch guidewire, a 5 French Pinnacle sheath was inserted. Through this, and also over 0.035 inch guidewire, a 5 [place] JB 1 catheter was advanced to the aortic arch region and selectively positioned in the left subclavian artery. FINDINGS: The left subclavian arteriogram demonstrates the origin of the left vertebral artery to be widely patent. There is moderate tortuosity just distal to the origin. More distally the vessel is seen to opacify to the cranial skull base. Patency is seen of the left posterior-inferior cerebellar artery. However, significantly decrease hemodynamic flow is  seen distal to this to a now nearly completely occluded left vertebrobasilar junction with faint opacification seen in the proximal basilar artery. ENDOVASCULAR REVASCULARIZATION OF NEAR COMPLETE OCCLUSION OF THE LEFT VERTEBROBASILAR JUNCTION The diagnostic JB 1 catheter in the distal left subclavian artery was exchanged over a 0.035 inch 300 cm Rosen exchange guidewire for an 8 French short Pinnacle sheath in the right groin which was connected to continuous heparinized saline infusion. Over the [facility] exchange guidewire, a 90 cm 8 [place] Neuron Max sheath was advanced and positioned just proximal to the origin of the left vertebral artery. The guidewire was removed. Good aspiration obtained from the Neuron Max sheath. A gentle control arteriogram performed through this demonstrates safe positioning of the tip of the catheter with no evidence of spasms, dissections or of intraluminal filling defects. Over a 0.035 inch Roadrunner guidewire, a 115 cm 5 [place] Catalyst guide catheter was now advanced into the distal left vertebral artery. Guidewire was removed. Good aspiration obtained from the hub of the Catalyst guide catheter. A control arteriogram performed through this continued to demonstrate near complete occlusion of the left vertebrobasilar junction with faint opacification of the proximal basilar artery. At this time, in a coaxial manner and with constant heparinized saline infusion, over a 0.14 inch standard Synchro micro guidewire with a J configuration, an 021 Trevo ProVue microcatheter was advanced to just distal to the origin of the left posterior-inferior cerebellar artery. Using a torque device, the micro guidewire was gingerly advanced through the occluded left vertebrobasilar junction to the distal basilar artery followed by the microcatheter. The guidewire was removed. Good aspiration obtained from the hub of the microcatheter. A gentle control arteriogram performed through the microcatheter  demonstrated safe positioning of the tip of the microcatheter which was exchanged for a 014 inch 300 cm Zoom exchange guidewire, which was positioned in the right posterior cerebral artery P1 segment. A 2 mm x 9 mm Apex angioplasty balloon catheter was prepped and purged with 50% contrast and 50% heparinized saline infusion. Using the rapid exchange technique, this was advanced without difficulty and positioned with its markers just covering the site of the severe stenosis. Control inflation was then performed using a micro inflation syringe device via micro tubing to 5.3 atmospheres where it was maintained for approximately 1.5 minutes. The balloon was then gently deflated. A control arteriogram performed through the Catalyst guide catheter demonstrated no significantly improved flow with only mild improvement in the caliber. Flow was now noted into the distal posterior circulation territory. A second angioplasty was then performed two 4.3 atmospheres where it was maintained for approximately 1 minute. The balloon was deflated and retrieved. A control arteriogram performed through the Catalyst guide catheter  demonstrated improved flow. However, there was now evidence of recoiling. A 2 mm x 15 mm Apex balloon was prepped and purged with heparinized saline infusion and advanced again using the rapid exchange technique and positioned adequate distant from the site of the previous angioplasty. At this time a slow control angioplasty was performed using micro inflation syringe device via micro tubing to approximately 2. 3 mm where it was maintained for approximately 1-1/2 minutes. Following deflation, balloon was retrieved more proximally. A control arteriogram performed through the Catalyst guide catheter now demonstrated improved flow and caliber through the angioplastied segment. However, there continued be evidence of recoil due to the partially calcified plaque. Balloon was then retrieved and removed. A 2.25 mm x  15 mm Resolute Onyx stent was prepped and purged with heparinized saline infusion and advanced again using the rapid exchange technique and positioned adequate distant from the site of the previous angioplasty of the left vertebrobasilar junction. Slow inflation of the balloon was then performed using a micro inflation syringe device via micro tubing to approximately 2.3 mm where it was maintained for approximately 30 seconds. The balloon was then deflated and retrieved proximally and removed. A control arteriogram performed through the Catalyst guide catheter demonstrated excellent flow through now the significantly improved caliber and flow through the angioplastied segment of the dominant left vertebrobasilar junction into the basilar artery. The posterior cerebral arteries, the superior cerebellar arteries and the anterior-inferior cerebellar arteries demonstrated patency without any gross intraluminal filling defects. Control arteriograms were then performed at 15 and 35 minutes post deployment of the stent. These continued to demonstrate flow through the stented segment without any evidence of intraluminal filling defects or occlusions. At this time the patient was also given approximately 4.5 mg of Integrilin intra-arterially to ensure no acute platelet aggregation at the site of the stent placement. None was observed. Additionally the patient received 25 mcg of nitroglycerin for treatment of vasospasm. The exchange micro guidewire was eventually removed. A final control arteriogram performed through the 5 [place] Catalyst guide catheter in left vertebral artery continued to demonstrate excellent flow through the angioplastied segment of the dominant left vertebrobasilar junction with a TICI 3 revascularization. Throughout the procedure, the patient's blood pressure and neurological status remained stable. No evidence of mass effect or of extravasation was noted. The 8 French right groin sheath was removed and  manual compression applied for hemostasis. Distal pulses remained Dopplerable in the posterior tibial, and the dorsalis pedis arteries bilaterally. The patient's general anesthesia was then reversed and the patient was extubated without difficulty. Upon recovery, the patient was able to breathe on his own. He started moving all his 4 extremities with slight weakness in the right upper extremity which eventually improved. Patient denied any headaches, nausea or vomiting. Patient continued to complain of the vision difficulties which he had prior to the procedure. He was then transferred to the PACU and eventually to the neuro ICU for post revascularization management. IMPRESSION: Status post endovascular revascularization of near complete occlusion of the dominant left vertebrobasilar junction with stent assisted angioplasty with restoration of TICI 3 revascularization. PLAN: Follow-up in the clinic 2-4 weeks post discharge. Electronically Signed   By: [name] M.D.   On: 09/02/2019 14:04   DG Swallowing Func-Speech Pathology  Result Date: 08/31/2019 Objective Swallowing Evaluation: Type of Study: MBS-Modified Barium Swallow Study  Patient Details Name: [name] MRN: [id number] Date of Birth: 1950-05-10 Today's Date: 08/31/2019 Time: SLP Start Time (ACUTE ONLY): 1111 -SLP Stop Time (  ACUTE ONLY): 1124 SLP Time Calculation (min) (ACUTE ONLY): 13 min Past Medical History: Past Medical History: Diagnosis Date . HLD (hyperlipidemia)  . MI (myocardial infarction) (HCC)  . Stroke [facility])  Past Surgical History: Past Surgical History: Procedure Laterality Date . CORONARY STENT INTERVENTION   . IR ANGIO INTRA EXTRACRAN SEL COM CAROTID INNOMINATE BILAT MOD SED  08/29/2019 . IR ANGIO VERTEBRAL SEL SUBCLAVIAN INNOMINATE UNI R MOD SED  08/29/2019 . IR ANGIO VERTEBRAL SEL VERTEBRAL UNI L MOD SED  08/29/2019 . IR US GUIDE VASC ACCESS RIGHT  08/29/2019 HPI: [name] presenting with acute infarcts of bilateral  occipital lobes and anterior L pons. Pt was admitted back in April with CVA of PCA territory as well and then again on 6/27. Pt began to experience dysequilibrium, slurred speech and difficulty walking while in [facility], went to ED but too long of a wait so came to [facility] when they came back.  Subjective: Pt was alert and cooperative Assessment / Plan / Recommendation CHL IP CLINICAL IMPRESSIONS 08/31/2019 Clinical Impression Pt presents with a mild oropharyngeal dysphagia, requiring increased time for oral preparation but with good oral control and clearance. He has mildly reduced laryngeal elevation and laryngeal vestibule closure that results in trace amounts of penetration to the vocal folds, not cleared spontaneously and only partially cleared with a cued throat clear and cough. At times, barium also seems to fall below the vocal folds in trace amounts. Occasional throat clearing was noted throughout testing but never associated with penetration or aspiration. His airway protection is improved with use of a chin tuck, but he does need Min-Mod cues to maintain the chin tuck until he has completed his swallow. Recommend continuing regular diet and thin liquids, using a chin tuck with all liquids.  SLP Visit Diagnosis Dysphagia, unspecified (R13.10) Attention and concentration deficit following -- Frontal lobe and executive function deficit following -- Impact on safety and function Mild aspiration risk   CHL IP TREATMENT RECOMMENDATION 08/31/2019 Treatment Recommendations Therapy as outlined in treatment plan below   Prognosis 08/31/2019 Prognosis for Safe Diet Advancement Good Barriers to Reach Goals Cognitive deficits Barriers/Prognosis Comment -- CHL IP DIET RECOMMENDATION 08/31/2019 SLP Diet Recommendations Regular solids;Thin liquid Liquid Administration via Cup;Straw Medication Administration Whole meds with puree Compensations Minimize environmental distractions;Slow rate;Small sips/bites;Chin tuck Postural  Changes Seated upright at 90 degrees   CHL IP OTHER RECOMMENDATIONS 08/31/2019 Recommended Consults -- Oral Care Recommendations Oral care BID Other Recommendations --   CHL IP FOLLOW UP RECOMMENDATIONS 08/31/2019 Follow up Recommendations Home health SLP;24 hour supervision/assistance   CHL IP FREQUENCY AND DURATION 08/31/2019 Speech Therapy Frequency (ACUTE ONLY) min 2x/week Treatment Duration 2 weeks      CHL IP ORAL PHASE 08/31/2019 Oral Phase WFL Oral - Pudding Teaspoon -- Oral - Pudding Cup -- Oral - Honey Teaspoon -- Oral - Honey Cup -- Oral - Nectar Teaspoon -- Oral - Nectar Cup -- Oral - Nectar Straw -- Oral - Thin Teaspoon -- Oral - Thin Cup -- Oral - Thin Straw -- Oral - Puree -- Oral - Mech Soft -- Oral - Regular -- Oral - Multi-Consistency -- Oral - Pill -- Oral Phase - Comment --  CHL IP PHARYNGEAL PHASE 08/31/2019 Pharyngeal Phase Impaired Pharyngeal- Pudding Teaspoon -- Pharyngeal -- Pharyngeal- Pudding Cup -- Pharyngeal -- Pharyngeal- Honey Teaspoon -- Pharyngeal -- Pharyngeal- Honey Cup -- Pharyngeal -- Pharyngeal- Nectar Teaspoon -- Pharyngeal -- Pharyngeal- Nectar Cup -- Pharyngeal -- Pharyngeal- Nectar Straw --  Pharyngeal -- Pharyngeal- Thin Teaspoon -- Pharyngeal -- Pharyngeal- Thin Cup Reduced laryngeal elevation;Reduced airway/laryngeal closure;Penetration/Aspiration during swallow Pharyngeal Material enters airway, CONTACTS cords and not ejected out Pharyngeal- Thin Straw Reduced laryngeal elevation;Reduced airway/laryngeal closure;Penetration/Aspiration during swallow Pharyngeal Material enters airway, passes BELOW cords without attempt by patient to eject out (silent aspiration) Pharyngeal- Puree WFL Pharyngeal -- Pharyngeal- Mechanical Soft WFL Pharyngeal -- Pharyngeal- Regular -- Pharyngeal -- Pharyngeal- Multi-consistency -- Pharyngeal -- Pharyngeal- Pill -- Pharyngeal -- Pharyngeal Comment --  CHL IP CERVICAL ESOPHAGEAL PHASE 08/31/2019 Cervical Esophageal Phase WFL Pudding Teaspoon --  Pudding Cup -- Honey Teaspoon -- Honey Cup -- Nectar Teaspoon -- Nectar Cup -- Nectar Straw -- Thin Teaspoon -- Thin Cup -- Thin Straw -- Puree -- Mechanical Soft -- Regular -- Multi-consistency -- Pill -- Cervical Esophageal Comment -- Mahala Menghini., M.A. CCC-SLP Acute Rehabilitation Services Pager [phone] Office [phone] 08/31/2019, 12:41 PM               Labs:  CBC: Recent Labs    08/30/19 0606 08/31/19 0826 09/02/19 1050 09/03/19 0259  WBC 8.1 7.2 11.9* 11.2*  HGB 14.1 14.3 11.7* 11.9*  HCT 41.4 41.2 34.4* 34.6*  PLT 292 322 276 307    COAGS: Recent Labs    08/26/19 0658 08/29/19 1825  INR 1.0 1.1  APTT 28  --     BMP: Recent Labs    08/31/19 0826 09/01/19 0357 09/02/19 1050 09/03/19 0259  NA 137 138 138 139  K 3.6 3.5 3.5 3.2*  CL 102 104 108 107  CO2 26 23 21* 22  GLUCOSE 192* 129* 144* 112*  BUN CALCIUM 9.4 9.0 8.8* 8.6*  CREATININE 1.54* 1.44* 1.45* 1.49*  GFRNONAA 46* 50* 49* 48*  GFRAA 53* 57* 57* 55*    LIVER FUNCTION TESTS: Recent Labs    08/27/19 1815 08/28/19 0407 08/30/19 0606 08/31/19 0826  BILITOT 0.8 0.8 0.9 1.4*  AST ALT ALKPHOS 122 112 121 134*  PROT 6.8 6.3* 7.2 7.3  ALBUMIN 3.2* 3.0* 3.5 3.5    TUMOR MARKERS: No results for input(s): AFPTM, CEA, CA199, CHROMGRNA in the last 8760 hours.  Assessment and Plan:  Status post revascularization of near occlusive dominant left vertebral basilar junction and proximal basilar artery with improved neurological function as per patient's wife.  Patient advised to continue taking aspirin 81 mg and Brilinta 90 mg twice a day as prescribed in addition to his other medications.  Plan for follow-up MRI of the brain and MRA of the brain in the last week a of November 2021.  Patient strongly advised to keep his appointment with the neurologist.  It was felt the patient would benefit from a fall a visual field examination and ophthalmology consult for  more accurate evaluation of the visual field deficits.  Patient encouraged to continue with physical therapy Informed patient that our schedulers will call to schedule MRI of the brain in MRA of the brain in late November 2021 .  All questions answered and concerns addressed. Patient conveys understanding and agrees with plan.  Thank you for this interesting consult.  I greatly enjoyed meeting [name] and look forward to participating in their care.  A copy of this report was sent to the requesting provider on this date.  Electronically Signed: Oneal Grout, MD 09/29/2019, 1:08 PM   I spent a total of  in face to face in clinical consultation,  greater than 50% of which was counseling/coordinating care, and billing the medical electronic charts, versus a meeting status, and educating regarding explained to the patient and the spouse the importance of having formal visual field testing which could help with subsequent occupational therapy planning.

## 2019-11-07 ENCOUNTER — Inpatient Hospital Stay: Payer: Medicare HMO | Admitting: Neurology

## 2020-01-05 ENCOUNTER — Other Ambulatory Visit: Payer: Self-pay

## 2020-01-05 ENCOUNTER — Ambulatory Visit: Payer: Medicare HMO | Admitting: Neurology

## 2020-01-05 ENCOUNTER — Encounter: Payer: Self-pay | Admitting: Neurology

## 2020-01-05 VITALS — BP 143/77 | HR 78 | Ht 70.0 in | Wt 202.0 lb

## 2020-01-05 DIAGNOSIS — I6389 Other cerebral infarction: Secondary | ICD-10-CM

## 2020-01-05 DIAGNOSIS — H53461 Homonymous bilateral field defects, right side: Secondary | ICD-10-CM

## 2020-01-05 DIAGNOSIS — I6502 Occlusion and stenosis of left vertebral artery: Secondary | ICD-10-CM

## 2020-01-05 NOTE — Progress Notes (Signed)
[facility] [address] [place]. [place] [address] [phone]       OFFICE CONSULT NOTE  [name] Date of Birth:  April 22, 1950 Medical Record Number:  [id number]   Referring MD: Marvel Plan Reason for Referral: Stroke  HPI: [name] is a 69 year old pleasant Caucasian male seen today for initial office consultation visit for stroke.  He is accompanied by his wife.  History is obtained from them, review of electronic medical records and I personally reviewed pertinent imaging films in PACS.  [name] has past medical history of hyperlipidemia, myocardial infarction and prior stroke with residual dysarthria and right-sided vision difficulties.  He was admitted to [facility] on 08/26/2019 for sudden onset of dysarthria.  He was visiting [place] and was taken to an ER there but patient declined MRI and admission.  He returned back to [facility] and saw his primary care physician who ordered an outpatient MRI scan which showed bilateral occipital and left anterior pontine infarcts as well as remote bilateral occipital, cerebellar and dorsal left midbrain as well as left centrum semiovale, bilateral thalamic left frontal white matter infarcts.  Patient was previously admitted in April 2021 with multiple posterior circulation strokes involving cerebellum and occipital lobes and left thalamus and midbrain at that time CT angiogram in June 2021 had shown severe bilateral vertebrobasilar junction stenosis with advanced intracranial atherosclerotic changes elsewhere as well.  Patient has had residual blurred vision in the right since his stroke in April with rest and gait imbalance slurred speech.  During the current admission in July 2021 CT angiogram showed occlusion of the right vertebral artery in the V4 segment and high-grade near occlusive stenosis of the left vertebrobasilar junction but patent basilar artery.  He underwent diagnostic cerebral catheter angiogram by Dr.  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  2D echo showed ejection fraction of 45 to 50%.  LDL cholesterol 55 mg percent hemoglobin A1c was 6.2.  Patient was started on aspirin and Brilinta.  Patient subsequently underwent elective angioplasty stenting of the near occlusive left vertebrobasilar junction on 09/01/2019 successfully.  He is remained on aspirin and Brilinta tolerating them well without bruising or bleeding.  He still has persistent right-sided vision loss and some gait imbalance and slurred speech but overall seems to be improving.  States his blood pressure is under good control today it is borderline in office at 143/77.  Is tolerating Lipitor well without muscle aches and pains.  He is able to ambulate short distances without assistance but uses cane for long distances.  He has an upcoming follow-up visit with Dr. Corliss Skains to look for stent patency.  ROS:   14 system review of systems is positive for vision loss, speech difficulty, imbalance, gait difficulty, bruising and all other systems negative  PMH:  Past Medical History:  Diagnosis Date  . HLD (hyperlipidemia)   . MI (myocardial infarction) (HCC)   . Stroke [facility])     Social History:  Social History   Socioeconomic History  . Marital status: Married    Spouse name: [name]  . Number of children: Not on file  . Years of education: Not on file  . Highest education level: Not on file  Occupational History  . Not on file  Tobacco Use  . Smoking status: Never Smoker  . Smokeless tobacco: Never Used  Substance and Sexual Activity  . Alcohol use: Not Currently  . Drug  use: Never  . Sexual activity: Not on file  Other Topics Concern  . Not on file  Social History Narrative   Lives with wife [name]   Right Handed   Drinks 1-2 cups caffeine   Social Determinants of Health   Financial Resource Strain:   . Difficulty of Paying Living  Expenses: Not on file  Food Insecurity:   . Worried About Programme researcher, broadcasting/film/video in the Last Year: Not on file  . Ran Out of Food in the Last Year: Not on file  Transportation Needs:   . Lack of Transportation (Medical): Not on file  . Lack of Transportation (Non-Medical): Not on file  Physical Activity:   . Days of Exercise per Week: Not on file  . Minutes of Exercise per Session: Not on file  Stress:   . Feeling of Stress : Not on file  Social Connections:   . Frequency of Communication with Friends and Family: Not on file  . Frequency of Social Gatherings with Friends and Family: Not on file  . Attends Religious Services: Not on file  . Active Member of Clubs or Organizations: Not on file  . Attends Banker Meetings: Not on file  . Marital Status: Not on file  Intimate Partner Violence:   . Fear of Current or Ex-Partner: Not on file  . Emotionally Abused: Not on file  . Physically Abused: Not on file  . Sexually Abused: Not on file    Medications:   Current Outpatient Medications on File Prior to Visit  Medication Sig Dispense Refill  . amLODipine (NORVASC) 2.5 MG tablet Take 2.5 mg by mouth daily.    Marland Kitchen aspirin EC 81 MG tablet Take 81 mg by mouth daily. Swallow whole.    Marland Kitchen atorvastatin (LIPITOR) 80 MG tablet Take 80 mg by mouth daily at 6 PM.     . carvedilol (COREG) 3.125 MG tablet Take 3.125 mg by mouth 2 (two) times daily.    . febuxostat (ULORIC) 40 MG tablet Take 40 mg by mouth daily.    Marland Kitchen FLUoxetine (PROZAC) 10 MG capsule Take 10 mg by mouth daily.    . Misc Natural Products (TART CHERRY ADVANCED PO) Take 1,200 mg by mouth daily.    Marland Kitchen MITIGARE 0.6 MG CAPS Take 0.6-1.2 mg by mouth 2 (two) times daily as needed (gout flareup).     . Multiple Vitamins-Minerals (ONE-A-DAY MENS 50+ PO) Take 1 tablet by mouth daily.    . polyethylene glycol (MIRALAX / GLYCOLAX) 17 g packet Take 17 g by mouth daily as needed for moderate constipation. 14 each 0   No current  facility-administered medications on file prior to visit.    Allergies:  No Known Allergies  Physical Exam General: well developed, well nourished pleasant middle-age Caucasian male, seated, in no evident distress Head: head normocephalic and atraumatic.   Neck: supple with no carotid or supraclavicular bruits Cardiovascular: regular rate and rhythm, no murmurs Musculoskeletal: no deformity Skin:  no rash/petichiae Vascular:  Normal pulses all extremities  Neurologic Exam Mental Status: Awake and fully alert. Oriented to place and time. Recent and remote memory intact. Attention span, concentration and fund of knowledge appropriate. Mood and affect appropriate.  Mild dysarthria but no aphasia Cranial Nerves: Fundoscopic exam reveals sharp disc margins. Pupils equal, briskly reactive to light. Extraocular movements full without nystagmus but psychiatric dysmetria to the right.. Visual fields show dense right homonymous hemianopsia to confrontation. Hearing intact. Facial sensation intact.  Mild right  nasolabial fold asymmetry.  Tongue, palate moves normally and symmetrically.  Motor: Normal bulk and tone. Normal strength in all tested extremity muscles. Sensory.: intact to touch , pinprick , position and vibratory sensation.  Coordination: Rapid alternating movements normal in all extremities. Finger-to-nose and heel-to-shin performed accurately bilaterally. Gait and Station: Arises from chair without difficulty. Stance is normal. Gait demonstrates normal stride length and balance . Able to heel, toe and tandem walk with moderate difficulty.  Reflexes: 1+ and symmetric. Toes downgoing.   NIHSS  4 Modified Rankin 2   ASSESSMENT: 69 year old Caucasian male with left pontine and bilateral occipital lobe infarcts and July 2021 secondary to symptomatic left vertebral basilar junction stenosis treated with successful angioplasty and stenting.  Vascular risk factors of prior stroke, hypertension  hyperlipidemia and occlusive intracranial atherosclerotic disease     PLAN: I had a long d/w patient and his wife about his recent stroke,vertebral artery angioplasty/stenting  risk for recurrent stroke/TIAs, personally independently reviewed imaging studies and stroke evaluation results and answered questions.Continue aspirin 81 mg daily and Brilinta 90 mg twice daily for secondary stroke prevention for 2 more months and then stop Brilinta and stay on aspirin alone and maintain strict control of hypertension with blood pressure goal below 130/90, diabetes with hemoglobin A1c goal below 6.5% and lipids with LDL cholesterol goal below 70 mg/dL. I also advised the patient to eat a healthy diet with plenty of whole grains, cereals, fruits and vegetables, exercise regularly and maintain ideal body weight.  Patient was advised not to drive due to his persistent peripheral vision loss.  He was advised to follow-up with Dr. Corliss Skains for intracranial stent surveillance.  Followup in the future with me in 3 months or call earlier if necessary.  Greater than 50% time during this 45-minute consultation visit was spent in counseling and coordination of care about his posterior circulation stroke, left vertebral artery angioplasty stenting and discussion about stroke prevention and treatment and answering questions. Delia Heady, MD  [facility] [address] [place], [place] [address]  Phone [phone] Fax [phone] Note: This document was prepared with digital dictation and possible smart phrase technology. Any transcriptional errors that result from this process are unintentional.

## 2020-01-05 NOTE — Patient Instructions (Signed)
I had a long d/w patient and his wife about his recent stroke,vertebral artery angioplasty/stenting  risk for recurrent stroke/TIAs, personally independently reviewed imaging studies and stroke evaluation results and answered questions.Continue aspirin 81 mg daily and Brilinta 90 mg twice daily for secondary stroke prevention for 2 more months and then stop Brilinta and stay on aspirin alone and maintain strict control of hypertension with blood pressure goal below 130/90, diabetes with hemoglobin A1c goal below 6.5% and lipids with LDL cholesterol goal below 70 mg/dL. I also advised the patient to eat a healthy diet with plenty of whole grains, cereals, fruits and vegetables, exercise regularly and maintain ideal body weight.  Patient was advised not to drive due to his persistent peripheral vision loss.  He was advised to follow-up with Dr. Corliss Skains for intracranial stent surveillance.  Followup in the future with me in 3 months or call earlier if necessary.  Stroke Prevention Some medical conditions and behaviors are associated with a higher chance of having a stroke. You can help prevent a stroke by making nutrition, lifestyle, and other changes, including managing any medical conditions you may have. What nutrition changes can be made?   Eat healthy foods. You can do this by: ? Choosing foods high in fiber, such as fresh fruits and vegetables and whole grains. ? Eating at least 5 or more servings of fruits and vegetables a day. Try to fill half of your plate at each meal with fruits and vegetables. ? Choosing lean protein foods, such as lean cuts of meat, poultry without skin, fish, tofu, beans, and nuts. ? Eating low-fat dairy products. ? Avoiding foods that are high in salt (sodium). This can help lower blood pressure. ? Avoiding foods that have saturated fat, trans fat, and cholesterol. This can help prevent high cholesterol. ? Avoiding processed and premade foods.  Follow your health care  provider's specific guidelines for losing weight, controlling high blood pressure (hypertension), lowering high cholesterol, and managing diabetes. These may include: ? Reducing your daily calorie intake. ? Limiting your daily sodium intake to 1,500 milligrams (mg). ? Using only healthy fats for cooking, such as olive oil, canola oil, or sunflower oil. ? Counting your daily carbohydrate intake. What lifestyle changes can be made?  Maintain a healthy weight. Talk to your health care provider about your ideal weight.  Get at least 30 minutes of moderate physical activity at least 5 days a week. Moderate activity includes brisk walking, biking, and swimming.  Do not use any products that contain nicotine or tobacco, such as cigarettes and e-cigarettes. If you need help quitting, ask your health care provider. It may also be helpful to avoid exposure to secondhand smoke.  Limit alcohol intake to no more than 1 drink a day for nonpregnant women and 2 drinks a day for men. One drink equals 12 oz of beer, 5 oz of wine, or 1 oz of hard liquor.  Stop any illegal drug use.  Avoid taking birth control pills. Talk to your health care provider about the risks of taking birth control pills if: ? You are over 28 years old. ? You smoke. ? You get migraines. ? You have ever had a blood clot. What other changes can be made?  Manage your cholesterol levels. ? Eating a healthy diet is important for preventing high cholesterol. If cholesterol cannot be managed through diet alone, you may also need to take medicines. ? Take any prescribed medicines to control your cholesterol as told by your health  care provider.  Manage your diabetes. ? Eating a healthy diet and exercising regularly are important parts of managing your blood sugar. If your blood sugar cannot be managed through diet and exercise, you may need to take medicines. ? Take any prescribed medicines to control your diabetes as told by your health  care provider.  Control your hypertension. ? To reduce your risk of stroke, try to keep your blood pressure below 130/80. ? Eating a healthy diet and exercising regularly are an important part of controlling your blood pressure. If your blood pressure cannot be managed through diet and exercise, you may need to take medicines. ? Take any prescribed medicines to control hypertension as told by your health care provider. ? Ask your health care provider if you should monitor your blood pressure at home. ? Have your blood pressure checked every year, even if your blood pressure is normal. Blood pressure increases with age and some medical conditions.  Get evaluated for sleep disorders (sleep apnea). Talk to your health care provider about getting a sleep evaluation if you snore a lot or have excessive sleepiness.  Take over-the-counter and prescription medicines only as told by your health care provider. Aspirin or blood thinners (antiplatelets or anticoagulants) may be recommended to reduce your risk of forming blood clots that can lead to stroke.  Make sure that any other medical conditions you have, such as atrial fibrillation or atherosclerosis, are managed. What are the warning signs of a stroke? The warning signs of a stroke can be easily remembered as BEFAST.  B is for balance. Signs include: ? Dizziness. ? Loss of balance or coordination. ? Sudden trouble walking.  E is for eyes. Signs include: ? A sudden change in vision. ? Trouble seeing.  F is for face. Signs include: ? Sudden weakness or numbness of the face. ? The face or eyelid drooping to one side.  A is for arms. Signs include: ? Sudden weakness or numbness of the arm, usually on one side of the body.  S is for speech. Signs include: ? Trouble speaking (aphasia). ? Trouble understanding.  T is for time. ? These symptoms may represent a serious problem that is an emergency. Do not wait to see if the symptoms will go  away. Get medical help right away. Call your local emergency services (911 in the [place] Do not drive yourself to the hospital.  Other signs of stroke may include: ? A sudden, severe headache with no known cause. ? Nausea or vomiting. ? Seizure. Where to find more information For more information, visit:  American Stroke Association: www.strokeassociation.org  National Stroke Association: www.stroke.org Summary  You can prevent a stroke by eating healthy, exercising, not smoking, limiting alcohol intake, and managing any medical conditions you may have.  Do not use any products that contain nicotine or tobacco, such as cigarettes and e-cigarettes. If you need help quitting, ask your health care provider. It may also be helpful to avoid exposure to secondhand smoke.  Remember BEFAST for warning signs of stroke. Get help right away if you or a loved one has any of these signs. This information is not intended to replace advice given to you by your health care provider. Make sure you discuss any questions you have with your health care provider. Document Revised: 01/02/2017 Document Reviewed: 02/26/2016 Elsevier Patient Education  2020 [employer].

## 2020-01-16 ENCOUNTER — Other Ambulatory Visit (HOSPITAL_COMMUNITY): Payer: Self-pay | Admitting: Interventional Radiology

## 2020-01-16 DIAGNOSIS — I639 Cerebral infarction, unspecified: Secondary | ICD-10-CM

## 2020-01-26 ENCOUNTER — Ambulatory Visit (HOSPITAL_COMMUNITY)
Admission: RE | Admit: 2020-01-26 | Discharge: 2020-01-26 | Disposition: A | Discharge status: Home / Self Care | Payer: Medicare HMO | Source: Ambulatory Visit | Attending: Interventional Radiology | Admitting: Interventional Radiology

## 2020-01-26 ENCOUNTER — Other Ambulatory Visit: Payer: Self-pay

## 2020-01-26 DIAGNOSIS — I639 Cerebral infarction, unspecified: Secondary | ICD-10-CM

## 2020-01-26 IMAGING — MR MR MRA HEAD W/O CM
9 of 19 series · 24 of 48 positions shown · non-contrast
Comparison: Report from catheter based angiography [DATE]. CT
angiogram head/neck [DATE], brain MRI [DATE].

CLINICAL DATA: Cerebrovascular accident (CVA), unspecified
mechanism.

EXAM:
MRI HEAD WITHOUT CONTRAST
MRA HEAD WITHOUT CONTRAST
TECHNIQUE: Multiplanar, multiecho pulse sequences of the brain and surrounding
structures were obtained without intravenous contrast. Angiographic
images of the head were obtained using MRA technique without
contrast.

[Series 2: DWI · axial · 3.0mm · 0.94mm/px · z∈[-37,+117]mm · 6 of 106 slices shown (1 of 4)]
[im 1/106]
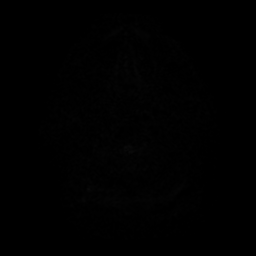
[im 22/106]
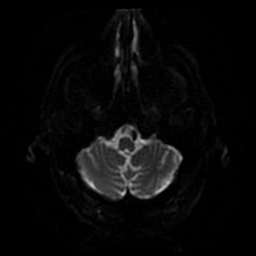
[im 43/106]
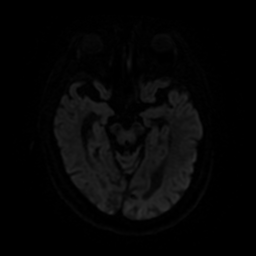
[im 64/106]
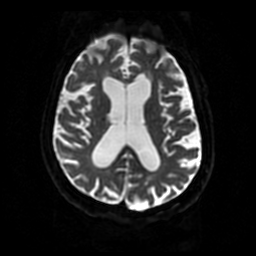
[im 85/106]
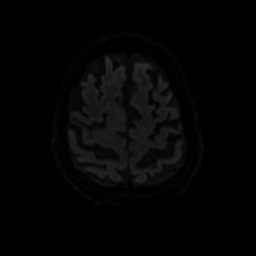
[im 106/106]
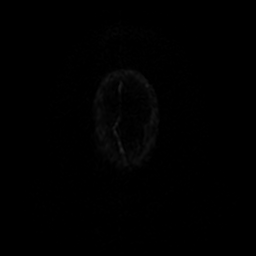

[Series 4: FLAIR · axial · 5.0mm · 0.47mm/px · 1 of 28 slices shown (1 of 2)]
[im 1/28]
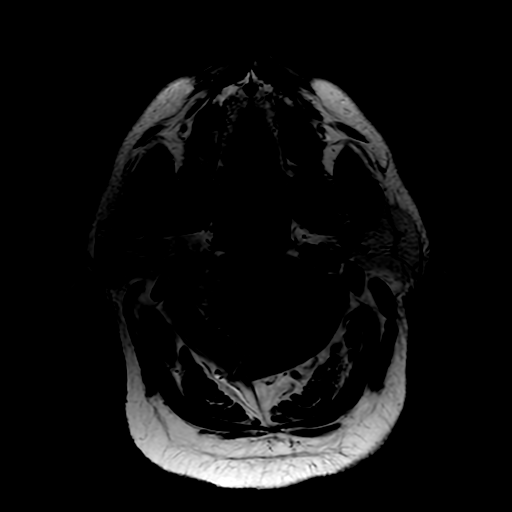

[Series 5: DWI · coronal · 4.0mm · 0.94mm/px · 3 of 74 slices shown (2 of 4)]
[im 1/74]
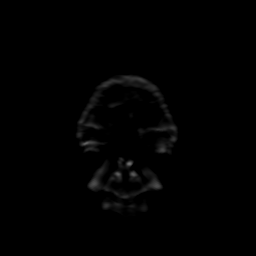
[im 37/74]
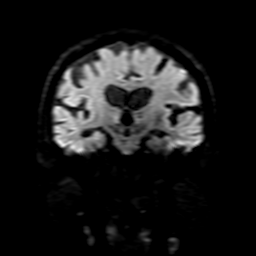
[im 74/74]
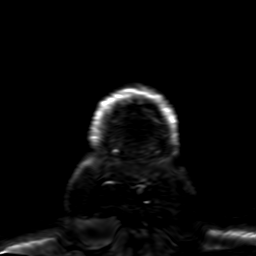

[Series 6: FLAIR · sagittal · 5.0mm · 0.23mm/px · 1 of 23 slices shown (2 of 2)]
[im 1/23]
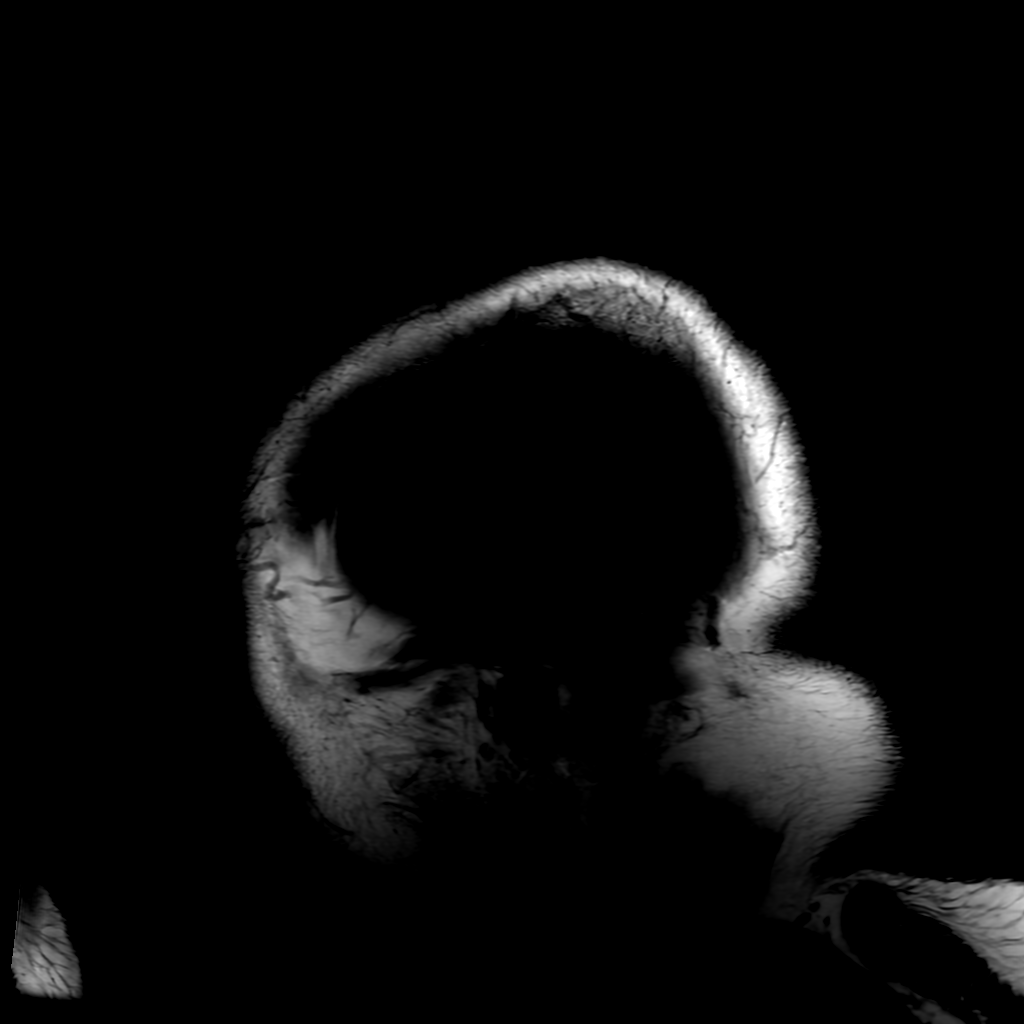

[Series 7: T2 · axial · 5.0mm · 0.23mm/px · 1 of 28 slices shown]
[im 1/28]
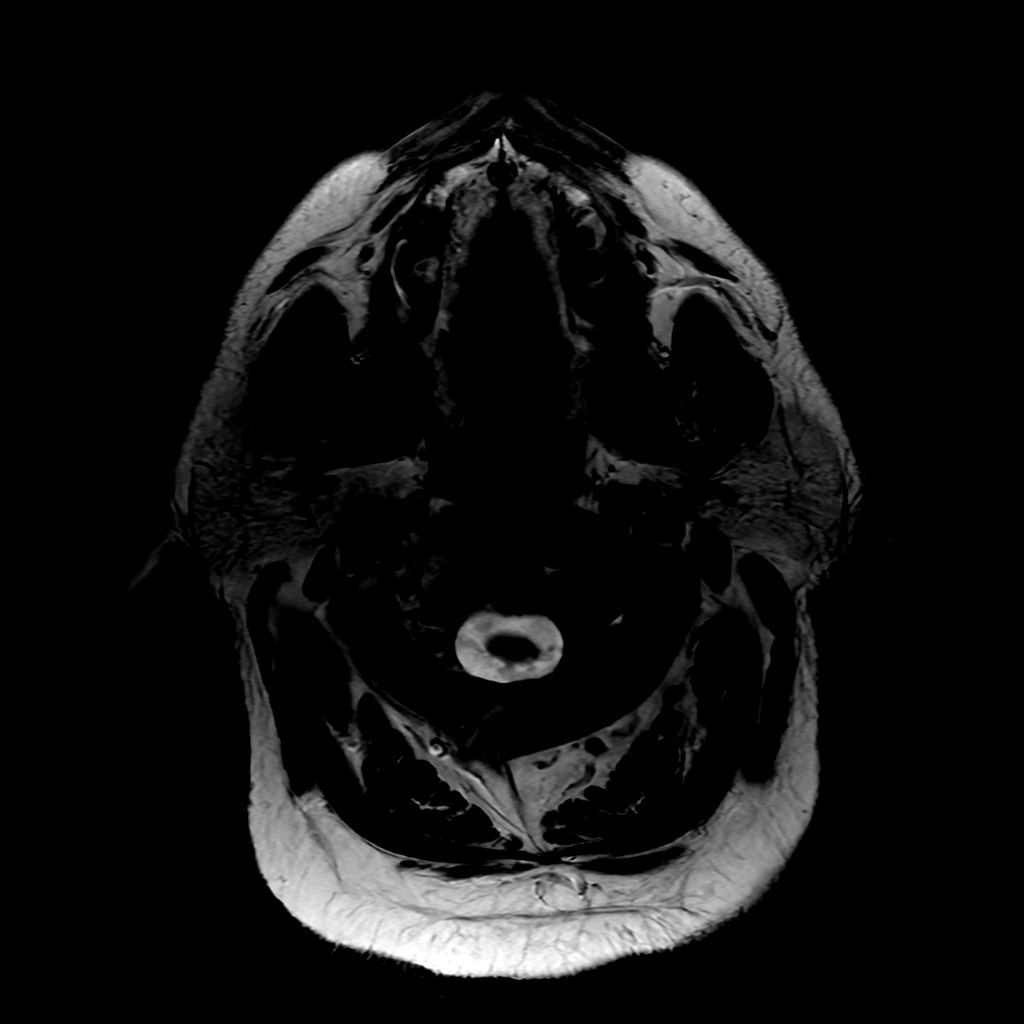

[Series 210: DWI · axial · 3.0mm · 0.94mm/px · z∈[-37,+117]mm · 5 of 106 slices shown (3 of 4)]
[im 1/106]
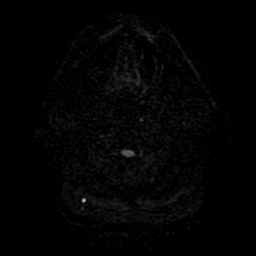
[im 27/106]
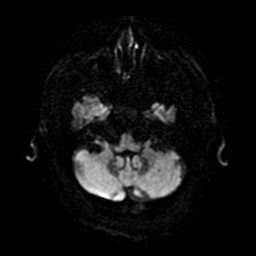
[im 53/106]
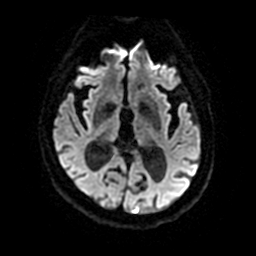
[im 79/106]
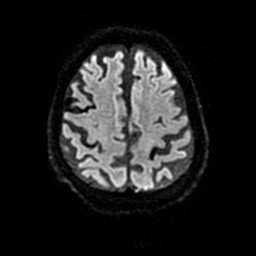
[im 106/106]
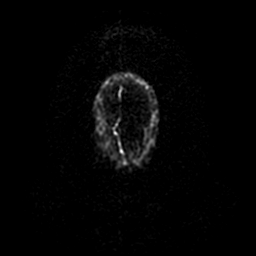

[Series 250: ADC · axial · 3.0mm · 0.94mm/px · z∈[-37,+117]mm · 2 of 53 slices shown (1 of 2)]
[im 1/53]
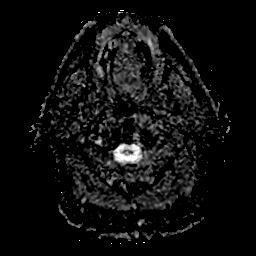
[im 53/53]
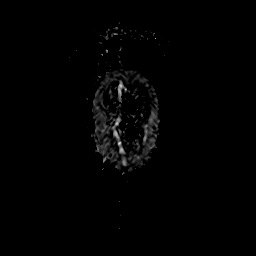

[Series 510: DWI · coronal · 4.0mm · 0.94mm/px · 3 of 74 slices shown (4 of 4)]
[im 1/74]
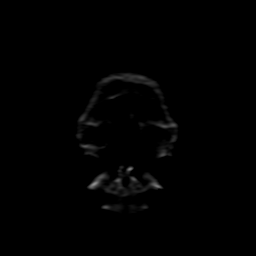
[im 37/74]
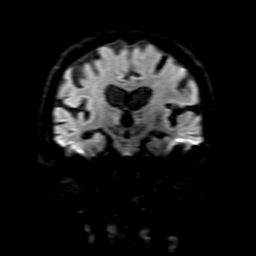
[im 74/74]
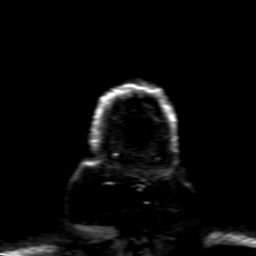

[Series 550: ADC · coronal · 4.0mm · 0.94mm/px · 2 of 37 slices shown (2 of 2)]
[im 1/37]
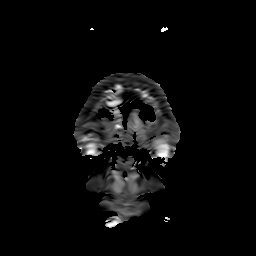
[im 37/37]
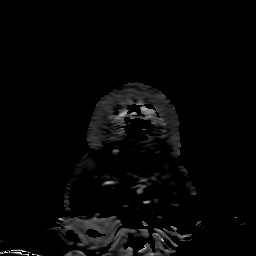

[24 of 48 positions shown; findings below may reference images not displayed]

FINDINGS: MRI HEAD FINDINGS

Brain:

Mild-to-moderate intermittent motion degradation.

Moderate to moderately advanced cerebral atrophy. Comparatively mild
cerebellar atrophy.

Redemonstrated chronic cortically based infarcts within the
posterior right temporal lobe and bilateral occipital lobes (PCA
vascular territories). Persistent restricted diffusion at site of
chronic infarcts within the bilateral occipital lobes (acute at time
of prior brain MRI [DATE]). Precontrast T1 hyperintensity at
these sites compatible with chronic petechial hemorrhage or cortical
laminar necrosis.

Redemonstrated chronic infarcts within the bilateral cerebral white
matter, right basal ganglia, bilateral thalami, pons and bilateral
cerebellar hemispheres. A known remote infarct within the dorsal
left midbrain was better appreciated on the prior MRI of [DATE]
(acute at that time).

Background periventricular and scattered T2/FLAIR hyperintensity
within the cerebral white matter, nonspecific but compatible with
chronic small vessel ischemic disease.

Supratentorial and infratentorial chronic microhemorrhages likely
secondary to chronic hypertensive microangiopathy given the
distribution

There is no acute infarct.

No evidence of intracranial mass.

No extra-axial fluid collection.

No midline shift.

No abnormal intracranial enhancement.

Vascular: Left frontal lobe development venous anomaly, an anatomic
variant.

Skull and upper cervical spine: No focal marrow lesion.

Sinuses/Orbits: Visualized orbits show no acute finding. Mild
diffuse paranasal sinus mucosal thickening.

Other: Trace fluid within the bilateral mastoid air cells.

MRA HEAD FINDINGS

The intracranial internal carotid arteries are patent.
Atherosclerotic irregularity of these vessels with no more than mild
stenosis. The M1 middle cerebral arteries are patent. No M2 proximal
branch occlusion or high-grade proximal stenosis is identified. The
anterior cerebral arteries are patent.

Since the prior CT angiogram of [DATE], there has been interval
endovascular revascularization of the dominant intracranial left
vertebral artery with stent assisted angioplasty. Stent artifact
limits evaluation of the stented portion of this vessel. However,
there is flow related signal proximal and distal to the stent
suggestive of stent patency. The non dominant intracranial right
vertebral artery is developmentally diminutive, but patent. The
basilar artery is patent. The posterior cerebral arteries are patent
proximally without significant stenosis. Atherosclerotic
irregularity of the distal posterior cerebral arteries bilaterally.

No intracranial aneurysm is identified.
IMPRESSION: MRI brain:

1. Mild-to-moderate intermittent motion degradation.
2. No evidence of acute intracranial abnormality.
3. Redemonstrated chronic cortically based infarcts within the right
temporal occipital and left occipital lobes.
4. Chronic small vessel ischemic disease with known chronic infarcts
in the bilateral cerebral white matter, right basal ganglia,
thalami, dorsal left midbrain, pons and bilateral cerebellar
hemispheres.
5. Stable moderate to moderately advanced cerebral atrophy.
6. Unchanged chronic microhemorrhages in a distribution suggestive
of hypertensive microangiopathy

MRA head:

1. Since the prior CT angiogram of [DATE], there has been
interval endovascular revascularization of the dominant intracranial
left vertebral artery with stent-assisted angioplasty. Stent
artifact on the current exam precludes evaluation of the stented
portion of the left vertebral artery. However, there is flow related
signal proximal and distal to the stent suggestive of stent patency.
2. Otherwise, no significant interval change. No evidence of
intracranial large vessel occlusion or proximal high-grade arterial
stenosis.

## 2020-01-26 MED ORDER — GADOBUTROL 1 MMOL/ML IV SOLN
10.0000 mL | Freq: Once | INTRAVENOUS | Status: AC | PRN
  Administered 2020-01-26: 10 mL via INTRAVENOUS

## 2020-01-30 ENCOUNTER — Telehealth (HOSPITAL_COMMUNITY): Payer: Self-pay

## 2020-01-30 NOTE — Telephone Encounter (Signed)
Pt's wife agreed to f/u in 6 months with diagnostic angiogram. Will call if symptoms change. AW

## 2020-03-07 ENCOUNTER — Telehealth: Payer: Self-pay | Admitting: Neurology

## 2020-03-07 NOTE — Telephone Encounter (Signed)
Called and spoke to [employer], patient's wife.  Discussed taking aspirin daily, chewable versus nonchewable.  Patient denied further questions, verbalized understanding and expressed appreciation for the phone call.

## 2020-03-07 NOTE — Telephone Encounter (Signed)
Pt's wife, Tamarius Rosenfield (on [place]) called, Do I give him a full aspirin or a low dose and Do he need aspirin at night? Would like a call from the nurse.

## 2020-04-09 ENCOUNTER — Encounter: Payer: Self-pay | Admitting: Neurology

## 2020-04-09 ENCOUNTER — Ambulatory Visit: Payer: Medicare HMO | Admitting: Neurology

## 2020-04-09 VITALS — BP 148/87 | HR 85 | Ht 70.0 in | Wt 208.0 lb

## 2020-04-09 DIAGNOSIS — I6990 Unspecified sequelae of unspecified cerebrovascular disease: Secondary | ICD-10-CM

## 2020-04-09 NOTE — Progress Notes (Signed)
[facility] [address] [place]. [place] [address] [phone]       OFFICE FOLLOW UP VISIT NOTE  [name] Date of Birth:  09-Apr-1950 Medical Record Number:  [id number]   Referring MD: Marvel Plan Reason for Referral: Stroke  HPI: Initial visit 01/05/2020 :[name] is a 70 year old pleasant Caucasian male seen today for initial office consultation visit for stroke.  He is accompanied by his wife.  History is obtained from them, review of electronic medical records and I personally reviewed pertinent imaging films in PACS.  [name] has past medical history of hyperlipidemia, myocardial infarction and prior stroke with residual dysarthria and right-sided vision difficulties.  He was admitted to [facility] on 08/26/2019 for sudden onset of dysarthria.  He was visiting [place] and was taken to an ER there but patient declined MRI and admission.  He returned back to [facility] and saw his primary care physician who ordered an outpatient MRI scan which showed bilateral occipital and left anterior pontine infarcts as well as remote bilateral occipital, cerebellar and dorsal left midbrain as well as left centrum semiovale, bilateral thalamic left frontal white matter infarcts.  Patient was previously admitted in April 2021 with multiple posterior circulation strokes involving cerebellum and occipital lobes and left thalamus and midbrain at that time CT angiogram in June 2021 had shown severe bilateral vertebrobasilar junction stenosis with advanced intracranial atherosclerotic changes elsewhere as well.  Patient has had residual blurred vision in the right since his stroke in April with rest and gait imbalance slurred speech.  During the current admission in July 2021 CT angiogram showed occlusion of the right vertebral artery in the V4 segment and high-grade near occlusive stenosis of the left vertebrobasilar junction but patent basilar artery.  He underwent diagnostic  cerebral catheter angiogram by Dr. Corliss Brown r which showed 95% left vertebrobasilar junction stenosis with right vertebral artery occlusion in the distal segment.  2D echo showed ejection fraction of 45 to 50%.  LDL cholesterol 55 mg percent hemoglobin A1c was 6.2.  Patient was started on aspirin and Brilinta.  Patient subsequently underwent elective angioplasty stenting of the near occlusive left vertebrobasilar junction on 09/01/2019 successfully.  He is remained on aspirin and Brilinta tolerating them well without bruising or bleeding.  He still has persistent right-sided vision loss and some gait imbalance and slurred speech but overall seems to be improving.  States his blood pressure is under good control today it is borderline in office at 143/77.  Is tolerating Lipitor well without muscle aches and pains.  He is able to ambulate short distances without assistance but uses cane for long distances.  He has an upcoming follow-up visit with Dr. Corliss Brown to look for stent patency. Update 04/09/2020 : He returns for follow-up after last visit 3 months ago.  Is accompanied by his wife today.  He is doing well has had no recurrent stroke or TIA symptoms.  His speech is improved but occasionally some words are slurred but he [name] is tired.  He still has not obtained any significant improvement in his peripheral vision which remains poor on the right side.  Patient has stopped Brilinta and is now on aspirin alone and is tolerating it well without significant bruising or bleeding.  Remains on Lipitor which is tolerating well without muscle aches and pains.  His blood pressure is well controlled today it is slightly borderline 148/87 in office today.  He has no new complaints. ROS:   14  system review of systems is positive for vision loss, speech difficulty, imbalance, gait difficulty, bruising and all other systems negative  PMH:  Past Medical History:  Diagnosis Date   HLD (hyperlipidemia)    MI  (myocardial infarction) (HCC)    Stroke (HCC)     Social History:  Social History   Socioeconomic History   Marital status: Married    Spouse name: [name]   Number of children: Not on file   Years of education: Not on file   Highest education level: Not on file  Occupational History   Not on file  Tobacco Use   Smoking status: Never Smoker   Smokeless tobacco: Never Used  Substance and Sexual Activity   Alcohol use: Not Currently   Drug use: Never   Sexual activity: Not on file  Other Topics Concern   Not on file  Social History Narrative   Lives with wife [name]   Right Handed   Drinks 1-2 cups caffeine   Social Determinants of Health   Financial Resource Strain: Not on file  Food Insecurity: Not on file  Transportation Needs: Not on file  Physical Activity: Not on file  Stress: Not on file  Social Connections: Not on file  Intimate Partner Violence: Not on file    Medications:   Current Outpatient Medications on File Prior to Visit  Medication Sig Dispense Refill   aspirin EC 81 MG tablet Take 81 mg by mouth daily. Swallow whole.     atorvastatin (LIPITOR) 80 MG tablet Take 80 mg by mouth daily at 6 PM.      carvedilol (COREG) 3.125 MG tablet Take 3.125 mg by mouth 2 (two) times daily.     colchicine-probenecid 0.5-500 MG tablet Take one tablet daily for a week, then increase to one tablet BID     FLUoxetine (PROZAC) 10 MG capsule Take 10 mg by mouth daily.     Misc Natural Products (TART CHERRY ADVANCED PO) Take 1,200 mg by mouth daily.     Multiple Vitamins-Minerals (ONE-A-DAY MENS 50+ PO) Take 1 tablet by mouth daily.     polyethylene glycol (MIRALAX / GLYCOLAX) 17 g packet Take 17 g by mouth daily as needed for moderate constipation. 14 each 0   amLODipine (NORVASC) 2.5 MG tablet Take 2.5 mg by mouth daily.     febuxostat (ULORIC) 40 MG tablet Take 40 mg by mouth daily.     MITIGARE 0.6 MG CAPS Take 0.6-1.2 mg by mouth 2 (two) times daily as  needed (gout flareup).      No current facility-administered medications on file prior to visit.    Allergies:  No Known Allergies  Physical Exam General: well developed, well nourished pleasant middle-age Caucasian male, seated, in no evident distress Head: head normocephalic and atraumatic.   Neck: supple with no carotid or supraclavicular bruits Cardiovascular: regular rate and rhythm, no murmurs Musculoskeletal: no deformity Skin:  no rash/petichiae Vascular:  Normal pulses all extremities  Neurologic Exam Mental Status: Awake and fully alert. Oriented to place and time. Recent and remote memory intact. Attention span, concentration and fund of knowledge appropriate. Mood and affect appropriate.  No dysarthria   no aphasia Cranial Nerves: Fundoscopic exam not done. Pupils equal, briskly reactive to light. Extraocular movements full without nystagmus but psychiatric dysmetria to the right.. Visual fields show dense right homonymous hemianopsia to confrontation. Hearing intact. Facial sensation intact.  Mild right nasolabial fold asymmetry.  Tongue, palate moves normally and symmetrically.  Motor: Normal  bulk and tone. Normal strength in all tested extremity muscles. Sensory.: intact to touch , pinprick , position and vibratory sensation.  Coordination: Rapid alternating movements normal in all extremities. Finger-to-nose and heel-to-shin performed accurately bilaterally. Gait and Station: Arises from chair without difficulty. Stance is normal. Gait demonstrates normal stride length and balance . Able to heel, toe and tandem walk with moderate difficulty.  Reflexes: 1+ and symmetric. Toes downgoing.   NIHSS  3 Modified Rankin 2   ASSESSMENT: 70 year old Caucasian male with left pontine and bilateral occipital lobe infarcts and July 2021 secondary to symptomatic left vertebral basilar junction stenosis treated with successful angioplasty and stenting.  Vascular risk factors of prior  stroke, hypertension hyperlipidemia and occlusive intracranial atherosclerotic disease.  He is stable from neurovascular standpoint but has persistent dense right homonymous hemianopsia.     PLAN: I had a long d/w patient about his recent stroke, risk for recurrent stroke/TIAs, personally independently reviewed imaging studies and stroke evaluation results and answered questions.Continue aspirin 81 mg daily  for secondary stroke prevention and maintain strict control of hypertension with blood pressure goal below 130/90, diabetes with hemoglobin A1c goal below 6.5% and lipids with LDL cholesterol goal below 70 mg/dL. I also advised the patient to eat a healthy diet with plenty of whole grains, cereals, fruits and vegetables, exercise regularly and maintain ideal body weight Followup in the future with   my nurse practitioner Shanda Bumps in 6 months or call earlier if necessaryGreater than 50% time during this 25-minute  visit was spent in counseling and coordination of care about his posterior circulation stroke, left vertebral artery angioplasty stenting and discussion about stroke prevention and treatment and answering questions. Delia Heady, MD  [facility] [address] [place], [place] [address]  Phone [phone] Fax [phone] Note: This document was prepared with digital dictation and possible smart phrase technology. Any transcriptional errors that result from this process are unintentional.

## 2020-04-09 NOTE — Patient Instructions (Signed)
I had a long d/w patient about his recent stroke, risk for recurrent stroke/TIAs, personally independently reviewed imaging studies and stroke evaluation results and answered questions.Continue aspirin 81 mg daily  for secondary stroke prevention and maintain strict control of hypertension with blood pressure goal below 130/90, diabetes with hemoglobin A1c goal below 6.5% and lipids with LDL cholesterol goal below 70 mg/dL. I also advised the patient to eat a healthy diet with plenty of whole grains, cereals, fruits and vegetables, exercise regularly and maintain ideal body weight Followup in the future with   my nurse practitioner Shanda Bumps in 6 months or call earlier if necessary

## 2020-08-01 ENCOUNTER — Other Ambulatory Visit (HOSPITAL_COMMUNITY): Payer: Self-pay | Admitting: Interventional Radiology

## 2020-08-01 DIAGNOSIS — I639 Cerebral infarction, unspecified: Secondary | ICD-10-CM

## 2020-08-27 ENCOUNTER — Inpatient Hospital Stay (HOSPITAL_COMMUNITY)
Admission: EM | Admit: 2020-08-27 | Discharge: 2020-09-04 | DRG: 314 | Disposition: A | Discharge status: Home Health | Payer: Medicare HMO | Attending: Student in an Organized Health Care Education/Training Program | Admitting: Student in an Organized Health Care Education/Training Program

## 2020-08-27 ENCOUNTER — Encounter (HOSPITAL_COMMUNITY): Payer: Self-pay | Admitting: Physician Assistant

## 2020-08-27 ENCOUNTER — Other Ambulatory Visit: Payer: Self-pay

## 2020-08-27 ENCOUNTER — Observation Stay (HOSPITAL_BASED_OUTPATIENT_CLINIC_OR_DEPARTMENT_OTHER): Payer: Medicare HMO

## 2020-08-27 ENCOUNTER — Emergency Department (HOSPITAL_COMMUNITY): Payer: Medicare HMO

## 2020-08-27 DIAGNOSIS — I301 Infective pericarditis: Secondary | ICD-10-CM | POA: Diagnosis not present

## 2020-08-27 DIAGNOSIS — R0602 Shortness of breath: Secondary | ICD-10-CM | POA: Diagnosis not present

## 2020-08-27 DIAGNOSIS — I10 Essential (primary) hypertension: Secondary | ICD-10-CM | POA: Diagnosis not present

## 2020-08-27 DIAGNOSIS — Z8249 Family history of ischemic heart disease and other diseases of the circulatory system: Secondary | ICD-10-CM

## 2020-08-27 DIAGNOSIS — Z8673 Personal history of transient ischemic attack (TIA), and cerebral infarction without residual deficits: Secondary | ICD-10-CM

## 2020-08-27 DIAGNOSIS — I313 Pericardial effusion (noninflammatory): Secondary | ICD-10-CM | POA: Diagnosis present

## 2020-08-27 DIAGNOSIS — E861 Hypovolemia: Secondary | ICD-10-CM | POA: Diagnosis not present

## 2020-08-27 DIAGNOSIS — R310 Gross hematuria: Secondary | ICD-10-CM | POA: Diagnosis present

## 2020-08-27 DIAGNOSIS — M109 Gout, unspecified: Secondary | ICD-10-CM | POA: Diagnosis present

## 2020-08-27 DIAGNOSIS — I4891 Unspecified atrial fibrillation: Secondary | ICD-10-CM | POA: Diagnosis present

## 2020-08-27 DIAGNOSIS — I309 Acute pericarditis, unspecified: Secondary | ICD-10-CM

## 2020-08-27 DIAGNOSIS — I130 Hypertensive heart and chronic kidney disease with heart failure and stage 1 through stage 4 chronic kidney disease, or unspecified chronic kidney disease: Secondary | ICD-10-CM | POA: Diagnosis present

## 2020-08-27 DIAGNOSIS — Z79899 Other long term (current) drug therapy: Secondary | ICD-10-CM

## 2020-08-27 DIAGNOSIS — D72828 Other elevated white blood cell count: Secondary | ICD-10-CM | POA: Diagnosis present

## 2020-08-27 DIAGNOSIS — E785 Hyperlipidemia, unspecified: Secondary | ICD-10-CM

## 2020-08-27 DIAGNOSIS — I480 Paroxysmal atrial fibrillation: Secondary | ICD-10-CM

## 2020-08-27 DIAGNOSIS — E876 Hypokalemia: Secondary | ICD-10-CM | POA: Diagnosis not present

## 2020-08-27 DIAGNOSIS — I252 Old myocardial infarction: Secondary | ICD-10-CM

## 2020-08-27 DIAGNOSIS — E1122 Type 2 diabetes mellitus with diabetic chronic kidney disease: Secondary | ICD-10-CM | POA: Diagnosis present

## 2020-08-27 DIAGNOSIS — T502X5A Adverse effect of carbonic-anhydrase inhibitors, benzothiadiazides and other diuretics, initial encounter: Secondary | ICD-10-CM | POA: Diagnosis not present

## 2020-08-27 DIAGNOSIS — N1831 Chronic kidney disease, stage 3a: Secondary | ICD-10-CM | POA: Diagnosis present

## 2020-08-27 DIAGNOSIS — Z6830 Body mass index (BMI) 30.0-30.9, adult: Secondary | ICD-10-CM

## 2020-08-27 DIAGNOSIS — Z7901 Long term (current) use of anticoagulants: Secondary | ICD-10-CM

## 2020-08-27 DIAGNOSIS — R079 Chest pain, unspecified: Secondary | ICD-10-CM

## 2020-08-27 DIAGNOSIS — E860 Dehydration: Secondary | ICD-10-CM | POA: Diagnosis present

## 2020-08-27 DIAGNOSIS — N179 Acute kidney failure, unspecified: Secondary | ICD-10-CM | POA: Diagnosis present

## 2020-08-27 DIAGNOSIS — I2510 Atherosclerotic heart disease of native coronary artery without angina pectoris: Secondary | ICD-10-CM | POA: Diagnosis present

## 2020-08-27 DIAGNOSIS — E119 Type 2 diabetes mellitus without complications: Secondary | ICD-10-CM

## 2020-08-27 DIAGNOSIS — R0600 Dyspnea, unspecified: Secondary | ICD-10-CM

## 2020-08-27 DIAGNOSIS — Z28310 Unvaccinated for covid-19: Secondary | ICD-10-CM

## 2020-08-27 DIAGNOSIS — Z955 Presence of coronary angioplasty implant and graft: Secondary | ICD-10-CM

## 2020-08-27 DIAGNOSIS — E869 Volume depletion, unspecified: Secondary | ICD-10-CM | POA: Diagnosis present

## 2020-08-27 DIAGNOSIS — Z20822 Contact with and (suspected) exposure to covid-19: Secondary | ICD-10-CM | POA: Diagnosis present

## 2020-08-27 DIAGNOSIS — L2489 Irritant contact dermatitis due to other agents: Secondary | ICD-10-CM | POA: Diagnosis not present

## 2020-08-27 DIAGNOSIS — I3139 Other pericardial effusion (noninflammatory): Secondary | ICD-10-CM | POA: Diagnosis present

## 2020-08-27 DIAGNOSIS — Z7982 Long term (current) use of aspirin: Secondary | ICD-10-CM

## 2020-08-27 DIAGNOSIS — J9601 Acute respiratory failure with hypoxia: Secondary | ICD-10-CM | POA: Diagnosis present

## 2020-08-27 DIAGNOSIS — E669 Obesity, unspecified: Secondary | ICD-10-CM | POA: Diagnosis present

## 2020-08-27 DIAGNOSIS — I255 Ischemic cardiomyopathy: Secondary | ICD-10-CM | POA: Diagnosis present

## 2020-08-27 DIAGNOSIS — I5042 Chronic combined systolic (congestive) and diastolic (congestive) heart failure: Secondary | ICD-10-CM | POA: Diagnosis present

## 2020-08-27 DIAGNOSIS — Z809 Family history of malignant neoplasm, unspecified: Secondary | ICD-10-CM

## 2020-08-27 HISTORY — DX: Type 2 diabetes mellitus without complications: E11.9

## 2020-08-27 LAB — URINALYSIS, ROUTINE W REFLEX MICROSCOPIC
Bacteria, UA: NONE SEEN
Bilirubin Urine: NEGATIVE
Glucose, UA: NEGATIVE mg/dL
Ketones, ur: NEGATIVE mg/dL
Leukocytes,Ua: NEGATIVE
Nitrite: NEGATIVE
Protein, ur: 30 mg/dL — AB
RBC / HPF: >50 RBC/hpf — ABNORMAL HIGH (ref 0–5)
Specific Gravity, Urine: 1.021 (ref 1.005–1.030)
pH: 5 (ref 5.0–8.0)

## 2020-08-27 LAB — RESP PANEL BY RT-PCR (FLU A&B, COVID) ARPGX2
Influenza A by PCR: NEGATIVE
Influenza B by PCR: NEGATIVE
SARS Coronavirus 2 by RT PCR: NEGATIVE

## 2020-08-27 LAB — LIPID PANEL
Cholesterol: 94 mg/dL (ref 0–200)
HDL: 22 mg/dL — ABNORMAL LOW (ref >40)
LDL Cholesterol: 64 mg/dL (ref 0–99)
Total CHOL/HDL Ratio: 4.3 RATIO
Triglycerides: 42 mg/dL (ref <150)
VLDL: 8 mg/dL (ref 0–40)

## 2020-08-27 LAB — COMPREHENSIVE METABOLIC PANEL
ALT: 22 U/L (ref 0–44)
AST: 28 U/L (ref 15–41)
Albumin: 2.6 g/dL — ABNORMAL LOW (ref 3.5–5.0)
Alkaline Phosphatase: 107 U/L (ref 38–126)
Anion gap: 8 (ref 5–15)
BUN: 17 mg/dL (ref 8–23)
CO2: 23 mmol/L (ref 22–32)
Calcium: 8.2 mg/dL — ABNORMAL LOW (ref 8.9–10.3)
Chloride: 106 mmol/L (ref 98–111)
Creatinine, Ser: 1.74 mg/dL — ABNORMAL HIGH (ref 0.61–1.24)
GFR, Estimated: 42 mL/min — ABNORMAL LOW (ref >60)
Glucose, Bld: 136 mg/dL — ABNORMAL HIGH (ref 70–99)
Potassium: 3.7 mmol/L (ref 3.5–5.1)
Sodium: 137 mmol/L (ref 135–145)
Total Bilirubin: 1.1 mg/dL (ref 0.3–1.2)
Total Protein: 6.4 g/dL — ABNORMAL LOW (ref 6.5–8.1)

## 2020-08-27 LAB — CBC WITH DIFFERENTIAL/PLATELET
Abs Immature Granulocytes: 0.07 10*3/uL (ref 0.00–0.07)
Basophils Absolute: 0 10*3/uL (ref 0.0–0.1)
Basophils Relative: 0 %
Eosinophils Absolute: 0.2 10*3/uL (ref 0.0–0.5)
Eosinophils Relative: 1 %
HCT: 42.1 % (ref 39.0–52.0)
Hemoglobin: 14.2 g/dL (ref 13.0–17.0)
Immature Granulocytes: 1 %
Lymphocytes Relative: 7 %
Lymphs Abs: 0.9 10*3/uL (ref 0.7–4.0)
MCH: 32.1 pg (ref 26.0–34.0)
MCHC: 33.7 g/dL (ref 30.0–36.0)
MCV: 95 fL (ref 80.0–100.0)
Monocytes Absolute: 1.5 10*3/uL — ABNORMAL HIGH (ref 0.1–1.0)
Monocytes Relative: 11 %
Neutro Abs: 11 10*3/uL — ABNORMAL HIGH (ref 1.7–7.7)
Neutrophils Relative %: 80 %
Platelets: 268 10*3/uL (ref 150–400)
RBC: 4.43 MIL/uL (ref 4.22–5.81)
RDW: 14.6 % (ref 11.5–15.5)
WBC: 13.7 10*3/uL — ABNORMAL HIGH (ref 4.0–10.5)
nRBC: 0 % (ref 0.0–0.2)

## 2020-08-27 LAB — ECHOCARDIOGRAM COMPLETE
Area-P 1/2: 3.91 cm2
Height: 70 in
P 1/2 time: 469 msec
S' Lateral: 4.1 cm
Weight: 3328 oz

## 2020-08-27 LAB — BRAIN NATRIURETIC PEPTIDE: B Natriuretic Peptide: 236.8 pg/mL — ABNORMAL HIGH (ref 0.0–100.0)

## 2020-08-27 LAB — PROTIME-INR
INR: 1.1 (ref 0.8–1.2)
Prothrombin Time: 13.8 seconds (ref 11.4–15.2)

## 2020-08-27 LAB — TROPONIN I (HIGH SENSITIVITY)
Troponin I (High Sensitivity): 27 ng/L — ABNORMAL HIGH (ref <18)
Troponin I (High Sensitivity): 23 ng/L — ABNORMAL HIGH (ref <18)

## 2020-08-27 LAB — HEPARIN LEVEL (UNFRACTIONATED): Heparin Unfractionated: <0.1 IU/mL — ABNORMAL LOW (ref 0.30–0.70)

## 2020-08-27 LAB — D-DIMER, QUANTITATIVE: D-Dimer, Quant: 1.82 ug/mL-FEU — ABNORMAL HIGH (ref 0.00–0.50)

## 2020-08-27 IMAGING — DX DG CHEST 1V PORT
1 series · 1 of 1 positions shown · non-contrast
Comparison: [DATE].

CLINICAL DATA: Dyspnea.

EXAM:
PORTABLE CHEST 1 VIEW

[chest ap]
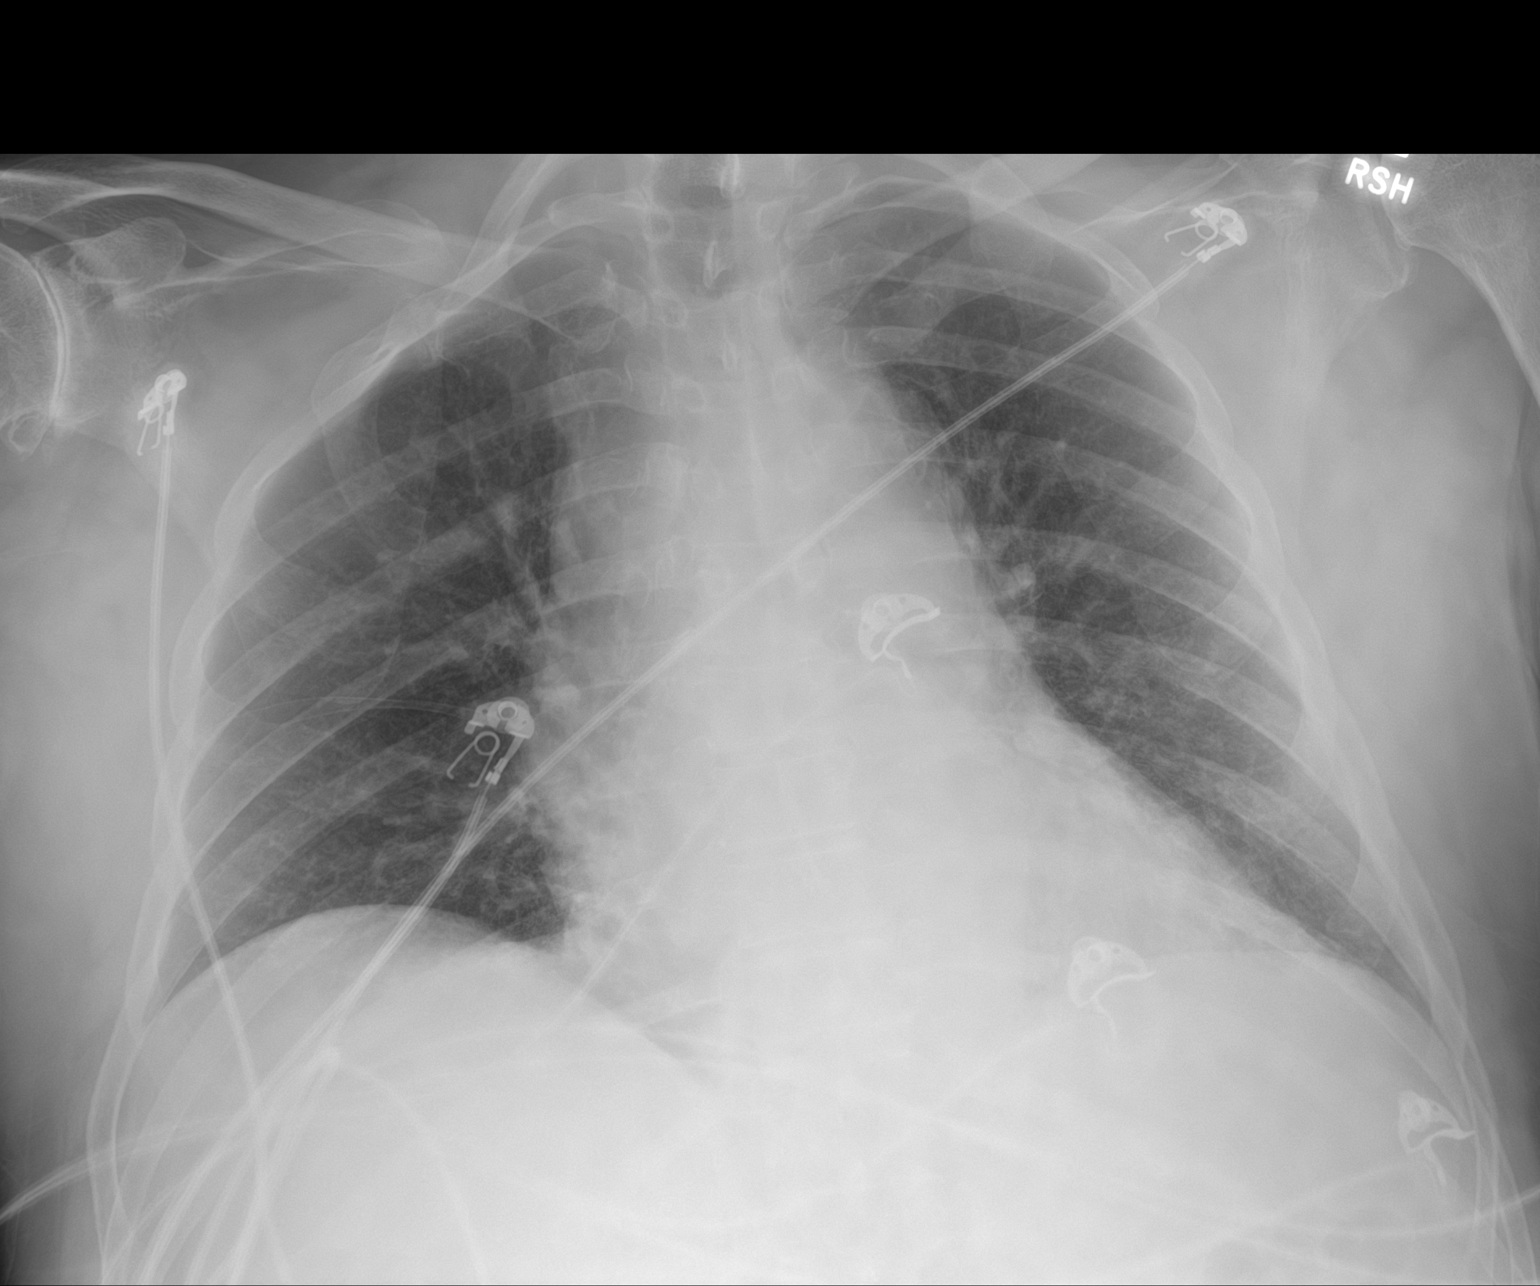

[1 of 1 positions shown; findings below may reference images not displayed]

FINDINGS: The heart size and mediastinal contours are within normal limits.
Both lungs are clear. The visualized skeletal structures are
unremarkable.
IMPRESSION: No active disease.

## 2020-08-27 MED ORDER — HEPARIN BOLUS VIA INFUSION
4000.0000 [IU] | Freq: Once | INTRAVENOUS | Status: AC
  Administered 2020-08-27: 4000 [IU] via INTRAVENOUS
  Filled 2020-08-27: qty 4000

## 2020-08-27 MED ORDER — ADULT MULTIVITAMIN W/MINERALS CH
1.0000 | ORAL_TABLET | Freq: Every day | ORAL | Status: DC
  Administered 2020-08-27 – 2020-09-04 (×9): 1 via ORAL
  Filled 2020-08-27 (×9): qty 1

## 2020-08-27 MED ORDER — ATORVASTATIN CALCIUM 80 MG PO TABS
80.0000 mg | ORAL_TABLET | Freq: Every day | ORAL | Status: DC
  Administered 2020-08-27 – 2020-09-03 (×7): 80 mg via ORAL
  Filled 2020-08-27 (×9): qty 1

## 2020-08-27 MED ORDER — ACETAMINOPHEN 325 MG PO TABS
650.0000 mg | ORAL_TABLET | ORAL | Status: DC | PRN
  Administered 2020-08-29 – 2020-09-01 (×3): 650 mg via ORAL
  Filled 2020-08-27 (×3): qty 2

## 2020-08-27 MED ORDER — TART CHERRY ADVANCED PO CAPS: ORAL_CAPSULE | Freq: Every day | ORAL | Status: DC

## 2020-08-27 MED ORDER — HEPARIN (PORCINE) 25000 UT/250ML-% IV SOLN
1400.0000 [IU]/h | INTRAVENOUS | Status: DC
  Administered 2020-08-27: 1000 [IU]/h via INTRAVENOUS
  Filled 2020-08-27 (×2): qty 250

## 2020-08-27 MED ORDER — CARVEDILOL 6.25 MG PO TABS
6.2500 mg | ORAL_TABLET | Freq: Two times a day (BID) | ORAL | Status: DC
  Administered 2020-08-27 – 2020-08-28 (×4): 6.25 mg via ORAL
  Filled 2020-08-27: qty 2
  Filled 2020-08-27 (×3): qty 1

## 2020-08-27 MED ORDER — ONDANSETRON HCL 4 MG/2ML IJ SOLN
4.0000 mg | Freq: Four times a day (QID) | INTRAMUSCULAR | Status: DC | PRN
  Administered 2020-08-29: 4 mg via INTRAVENOUS
  Filled 2020-08-27: qty 2

## 2020-08-27 MED ORDER — HEPARIN BOLUS VIA INFUSION
3000.0000 [IU] | Freq: Once | INTRAVENOUS | Status: AC
  Administered 2020-08-27: 3000 [IU] via INTRAVENOUS
  Filled 2020-08-27: qty 3000

## 2020-08-27 MED ORDER — ASPIRIN EC 81 MG PO TBEC
81.0000 mg | DELAYED_RELEASE_TABLET | Freq: Every day | ORAL | Status: DC
  Administered 2020-08-28 – 2020-09-04 (×8): 81 mg via ORAL
  Filled 2020-08-27 (×8): qty 1

## 2020-08-27 MED ORDER — DILTIAZEM HCL-DEXTROSE 125-5 MG/125ML-% IV SOLN (PREMIX)
5.0000 mg/h | INTRAVENOUS | Status: DC
  Administered 2020-08-27: 5 mg/h via INTRAVENOUS
  Filled 2020-08-27: qty 125

## 2020-08-27 MED ORDER — FLUOXETINE HCL 10 MG PO CAPS
10.0000 mg | ORAL_CAPSULE | Freq: Every day | ORAL | Status: DC
  Administered 2020-08-28 – 2020-09-04 (×8): 10 mg via ORAL
  Filled 2020-08-27 (×9): qty 1

## 2020-08-27 NOTE — ED Notes (Signed)
ED Provider at bedside.  Wife at bedside to discuss treatment options.  Pt relaxed, breathing unlabored HR 87.

## 2020-08-27 NOTE — Progress Notes (Signed)
Pt assisted to bedside to use urinal. Pt complained of some burning and blood in his urine. Urine appeared amber with small blood clots within. Pt also very SOB when attempting to stand or move in bed. Oxygen saturation ranging between 89-91% with activity. Lung sounds had bilateral wheezing during auscultation. Pt placed on 2L of oxygen. Provider called and notified of events.

## 2020-08-27 NOTE — Progress Notes (Signed)
  Echocardiogram 2D Echocardiogram has been performed.  [name] 08/27/2020, 4:32 PM

## 2020-08-27 NOTE — ED Notes (Signed)
Cardizem continues at 5mg /hr per cardiology request

## 2020-08-27 NOTE — H&P (Signed)
Date: 08/27/2020                         Patient Name:  [name] MRN: [id number]  DOB: 1950/03/09 Age / Sex: 70 y.o., male   PCP: Katherine Basset, PA-C               Medical Service: Internal Medicine Teaching Service               Attending Physician: Dr. Wynetta Fines, MD      First Contact: Lorraine, MS4 Pager: [phone]  Second Contact: Dr. Karilyn Cota Pager: [phone]           After Hours (After 5p/ First Contact Pager: [phone]  weekends / holidays): Second Contact Pager: [phone]    Chief Complaint: chest pain  History of Present Illness:  [name] is a 38 yoM with a PMHX of MI s/p stent (1990), diet controlled T2DM, CKD, HTN, HLD, CVA x4, HFrEF (EF 45%-50%) and obesity admitted to the I[facility] for observation after episode of atrial fibrillation with RVR.    On 7/24 around 9pm, pt started having mid-sternal non-radiating chest pain while walking. He returned home and laid down which improved his pain. Pain is like his previous MI chest pain and improved with medications he received from EMS. Pain was coupled with palpitations, dyspnea and diaphoresis. Normally able to walk ~0.5 mile w/o SOB. Does not use a cane or walker at home. SOB and chest pain have since resolved. He denies lightheadedness, dizziness, new vision changes, headaches, n/v, congestion, cough or fever. He had some bilateral vision loss with his previous stroke. He also reports no sick contacts. Legs and abdomen have been more swollen than normal but pt is not on home Lasix. Weigh has been stable.   Pt reported recent bouts of diarrhea that that has since resolved.   He states this is his first time in the hospital. Denies any prior hospitalizations.   In the ED, AF with RVR was noted. He was given IV fluids and started on diltiazem leading to resolution of AF and symptoms.  Home meds: aspirin 81mg  QD  amlodipine 2.5 mg QD atorvastatin 80mg  QD carvedilol 3.125 mg BID colchicine-probenecid 0.5-500 MG  tablet BID fluoxetine 10mg  QD Multivitamins    Allergies: Allergies as of 08/27/2020   (No Known Allergies)   Past Medical History:  Diagnosis Date   Diabetes mellitus type 2, diet-controlled (HCC)    HLD (hyperlipidemia)    MI (myocardial infarction) (HCC) 1990   at [facility]   Stroke [facility]) 2021   x 4, initially at [facility], others at [facility]; then came to [facility] w/ ?CVA >> blockage found and fixed   FHx: Father had cancer.   SHx: Lives at home with wife in [place]. Has two kids. Has a pet Yorkie. Denies tobacco, alcohol or illicit drug use.   Review of Systems: A complete ROS was negative except as per HPI.   Physical Exam: Blood pressure 119/70, pulse 85, temperature 98.8 F (37.1 C), temperature source Oral, resp. rate 20, height 5\' 10"  (1.778 m), weight 94.3 kg, SpO2 94 %. General: Well-appearing male lying in bed, in no acute distress.  HEENT: Normocephalic, atraumatic, conjunctiva normal. Non-erythematous oropharynx. Pupils round and reactive to light.   CV: Regular rate but with irregular rhythm. No murmurs on auscultations.   Pulm: Clear to auscultation bilaterally. Normal work of breathing. Patient sating well ORA.  Abdomen: Soft and  nontender to palpation of all four quadrants. Mildly distended. MSK: Bilateral lower extremity with trace swelling.  Skin: Well-perfused and warm extremities with normal skin changes from ageing.  Neuro: Alert and oriented.  Psych: Normal mood and affect.   EKG: In ED, EKG w/ afib and ventricular premature complex.   CXR: No cardiac or lung findings on imaging.   Assessment & Plan by Problem: Active Problems:   Atrial fibrillation with RVR (HCC) Non-radiating chest pain Exertional dyspnea  HTN HLD Diet-controlled T2DM Gout  Assessment: [name] is a 51 yoM with a PMHX of MI s/p stent (1990), diet controlled T2DM, CKD, HTN, HLD, CVA x4, HFrEF (EF 45%-50%) and obesity admitted to the IMT[facility] for observation of  new onset (<48hrs) atrial fibrillation with RVR. Converted to sinus rhythm with IV diltiazem and now rate controlled with carvedilol.   New onset Afib w/ RVR Non-radiating chest pain Exertional dyspnea  Pt presented to ED w/ since resolved chest pain and exertional dyspnea and later found to have a.fib w/ RVR. Converted to sinus after bolus IV diltazem 10 mg by EMS. On admission, CMP with mildly elevated creatinine, but no other electrolyte abnormalities. CBC with mild leukocytosis and no evidence of anemia. Leukocytosis could be a result of his recent diarrheal illness likely 2/2 to viral gastroenteritis.Trop x2 have been mildly elevated but down trending. BNP elevated at 236.8, d-dimer also elevated at 1.82. Pt not volume overloaded on exam and calfs without erythema or swelling. CXR also with out evidence for CHF/pulmonary edema. Pt likely presenting w/ paroxysmal afib and chest pain and dyspnea most likely 2/2 to supply and demand mismatch. We are also considering a cardiac (CHF, structural), pulmonary (PE), thyroid and infectious (viral) etiology behind his a.fib. Clinical presentation not consistent with CHF exacerbation but will reassess heart function on TTE and will get daily weight monitoring. Pt with a Well's criteria of 0 but given elevated d-dimer, will get V/Q scan over a CTA given pt hx of kidney disease. Will also get TSH. Echo likely tomorrow per cardiology recommendations. Pt with a CHA2DS2-VASc score of 6 and is being anticoagulated with heparin, with plans to switch to Eliquis at discharge. Given that pt is 69yo, goal is to rate control w/b-blocker with target resting HR <105 bpm.  -telemetry monitoring  -increase home carvedilol to 6.25 bid -f/u TSH -V/Q scan likely tomorrow -TTE -daily weight -strict ins and outs -Cardiology consulted, appreciate recommendation   HTN: Well-controlled. BP since admission have been normo- to mildly hypotensive. On home carvedilol and amlodipine.  Hold amlodipine in setting of increased b-blocker, reconsider starting at discharge.  -increase carvedilol from 3.125 mg bid to 6.25 mg bid  HLD: Well controlled. Pt on high dose statin at home. Lipid panel on 7/25 showed normal cholesterol and LDL. 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 Doesn't seem pre-renal in nature. Could potentially be from recent diarrheal illness or progression of diabetic kidney disease.  -trend BMP -avoid nephrotoxic drugs  -consider rechecking A1c in outpatient setting  T2DM: Diet controlled diabetes. Last A1c on 08/2019 was 6.2.  Gout: Pt on home ppx colchicine-probenecid. Last flare up in 08/2019. Hold home ppx for now and restart at discharge.   Diet: Heart healthy diet VTE PPX: Heparin gtt  Code status: full    Prior to Admission Living Arrangement: Home Anticipated Discharge Location: Home Barriers to Discharge: Clinical improvement  Dispo: Anticipated discharge pending v/q scan and echocardiogram. Likely in <2 days.  Dispo: Admit patient to Observation with expected length of stay less than 2 midnights.  Signed: Becky Augusta, Medical Student 08/27/2020, 5:17 PM     Attestation for Student Documentation:  I personally was present and performed or re-performed the history, physical exam and medical decision-making activities of this service and have verified that the service and findings are accurately documented in the student's note.  Byrd Rushlow N, DO 08/27/2020, 6:43 PM

## 2020-08-27 NOTE — ED Notes (Signed)
ED Provider at bedside. 

## 2020-08-27 NOTE — ED Triage Notes (Addendum)
Pt arrives via ems with Afib RVR initial rate 180, given iv bolus , cardizem 10 mg with improved to 120bpm.  Pt complained of dizziness and shortness of breath prior to ems arrival.  Upon hospital arrival pt asymptomatic, alert, oriented, following all commands appropriatelly.  Hx CVA, MI,

## 2020-08-27 NOTE — ED Provider Notes (Signed)
[facility] EMERGENCY DEPARTMENT Provider Note   CSN: [id number] Arrival date & time: 08/27/20  6144     History Chief Complaint  Patient presents with   Tachycardia    [name] is a 70 y.o. male.  70 year old male with prior medical history as detailed below presents for evaluation.  Patient apparently with gradually worsening shortness of breath over the last 20 hours.  Patient reports that he took a walk last night and felt very short of breath. This was associated with substernal chest pressure.  He called EMS this morning with same complaint.  EMS noted that he was tachycardic into the 180s.  EMS noted rapid A. fib on his EKG.  He was given 1 L normal saline and 10 mg of Cardizem on the way to the ED.  Upon arrival to the ED, he feels improved.  He denies significant shortness of breath at this time.  His heart rate is currently in the 120s to 130s -noted to be in A. fib.  Patient takes a baby aspirin daily.  He denies other use of anticoagulants.  Previously patient was on both Plavix and then Brilinta.  He has not been on anything but aspirin since January.  Patient's wife reports that the patient has no documented history of arrhythmia.  Patient status post cardiac stenting " at least 10 years ago."  He denies prior history of A. fib.  He denies current sensation of palpitations.  He denies chest pain, fever, nausea, vomiting, or other significant complaint.  The history is provided by the patient and medical records.  Illness Location:  Dyspnea, A. 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)  09/01/2019   Altered mental status 08/30/2019   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/27/2019   Palliative care by specialist    Goals of care, counseling/discussion    Stroke (HCC) 08/26/2019   Slurred speech 08/26/2019   Dyslipidemia 08/26/2019   CKD (chronic kidney disease), stage III (HCC) 08/26/2019   DM II (diabetes mellitus, type II), controlled (HCC) 08/26/2019   Cerebrovascular disease 08/26/2019    Past Surgical History:  Procedure Laterality Date   CORONARY STENT INTERVENTION     IR ANGIO INTRA EXTRACRAN SEL COM CAROTID INNOMINATE BILAT MOD SED  08/29/2019   IR ANGIO VERTEBRAL SEL SUBCLAVIAN INNOMINATE UNI R MOD SED  08/29/2019   IR ANGIO VERTEBRAL SEL VERTEBRAL UNI L MOD SED  08/29/2019   IR INTRA CRAN STENT  09/01/2019   IR US GUIDE VASC ACCESS RIGHT  08/29/2019   RADIOLOGY WITH ANESTHESIA N/A 09/01/2019   Procedure: IR WITH ANESTHESIA BASILAR ARTERY STENTING;  Surgeon: [name], MD;  Location: [facility] OR;  Service: Radiology;  Laterality: N/A;       No family history on file.  Social History   Tobacco Use   Smoking status: Never   Smokeless tobacco: Never  Substance Use Topics   Alcohol use: Not Currently   Drug use: Never    Home Medications Prior to Admission medications   Medication Sig Start Date End Date Taking? Authorizing Provider  amLODipine (NORVASC)  2.5 MG tablet Take 2.5 mg by mouth daily. 08/17/19   [provider]  aspirin EC 81 MG tablet Take 81 mg by mouth daily. Swallow whole.    [provider]  atorvastatin (LIPITOR) 80 MG tablet Take 80 mg by mouth daily at 6 PM.  08/17/19   [provider]  carvedilol (COREG) 3.125 MG tablet Take 3.125 mg by mouth 2 (two) times daily. 07/07/19   [provider]  colchicine-probenecid 0.5-500 MG tablet Take one tablet daily for a week, then increase to one tablet BID 03/30/20   [provider]  febuxostat (ULORIC) 40 MG tablet Take 40 mg by mouth daily. 08/17/19   [provider]  FLUoxetine (PROZAC) 10 MG capsule Take 10 mg by mouth daily. 08/17/19   [provider]  Misc Natural Products (TART CHERRY ADVANCED PO) Take 1,200 mg by mouth daily.    [provider]  MITIGARE 0.6 MG CAPS Take 0.6-1.2 mg by mouth 2 (two) times daily as needed (gout flareup).  06/10/19   [provider]  Multiple Vitamins-Minerals (ONE-A-DAY MENS 50+ PO) Take 1 tablet by mouth daily.    [provider]  polyethylene glycol (MIRALAX / GLYCOLAX) 17 g packet Take 17 g by mouth daily as needed for moderate constipation. 09/03/19   Arrien, York Ram, MD    Allergies    Patient has no known allergies.  Review of Systems   Review of Systems  All other systems reviewed and are negative.  Physical Exam Updated Vital Signs BP (!) 118/93   Pulse (!) 150   Temp 99.6 F (37.6 C) (Oral)   Resp (!) 23   SpO2 97%   Physical Exam Vitals and nursing note reviewed.  Constitutional:      General: He is not in acute distress.    Appearance: Normal appearance. He is well-developed.  HENT:     Head: Normocephalic and atraumatic.  Eyes:     Conjunctiva/sclera: Conjunctivae normal.     Pupils: Pupils are equal, round, and reactive to light.  Cardiovascular:     Rate and Rhythm: Tachycardia present. Rhythm irregular.     Heart sounds: Normal heart sounds.  Pulmonary:     Effort: Pulmonary effort is normal. No respiratory distress.     Breath sounds: Normal breath sounds.  Abdominal:     General: There is no distension.     Palpations: Abdomen is soft.     Tenderness: There is no abdominal tenderness.  Musculoskeletal:        General: No deformity. Normal range of motion.     Cervical back: Normal range of motion and neck supple.  Skin:    General: Skin is warm and dry.  Neurological:     General: No focal deficit present.     Mental Status: He is alert and oriented to person, place, and time.    ED Results / Procedures / Treatments    Labs (all labs ordered are listed, but only abnormal results are displayed) Labs Reviewed  RESP PANEL BY RT-PCR (FLU A&B, COVID) [id number]  COMPREHENSIVE METABOLIC PANEL  CBC WITH DIFFERENTIAL/PLATELET  PROTIME-INR  TROPONIN I (HIGH SENSITIVITY)    EKG EKG Interpretation  Date/Time:  Monday August 27 2020 09:12:29 EDT Ventricular Rate:  139 PR Interval:    QRS Duration: 119 QT Interval:  320 QTC Calculation: 487 R Axis:   97 Text Interpretation: Atrial fibrillation Ventricular premature complex RBBB and LPFB Confirmed by Kristine Royal [phone]) on  08/27/2020 9:25:52 AM  Radiology DG Chest Port 1 View  Result Date: 08/27/2020 CLINICAL DATA:  Dyspnea. EXAM: PORTABLE CHEST 1 VIEW COMPARISON:  July 31, 2019. FINDINGS: The heart size and mediastinal contours are within normal limits. Both lungs are clear. The visualized skeletal structures are unremarkable. IMPRESSION: No active disease. Electronically Signed   By: Lupita Raider M.D.   On: 08/27/2020 09:34    Procedures Procedures   Medications Ordered in ED Medications  diltiazem (CARDIZEM) 125 mg in dextrose 5% 125 mL (1 mg/mL) infusion (has no administration in time range)    ED Course  I have reviewed the triage vital signs and the nursing notes.  Pertinent labs & imaging results that were available during my care of the patient were reviewed by me and considered in my medical decision making (see chart for details).    MDM Rules/Calculators/A&P                          CRITICAL CARE Performed by: Wynetta Fines   Total critical care time: 30 minutes  Critical care time was exclusive of separately billable procedures and treating other patients.  Critical care was necessary to treat or prevent imminent or life-threatening deterioration.  Critical care was time spent personally by me on the following activities: development of treatment plan with patient and/or surrogate as well as nursing, discussions with  consultants, evaluation of patient's response to treatment, examination of patient, obtaining history from patient or surrogate, ordering and performing treatments and interventions, ordering and review of laboratory studies, ordering and review of radiographic studies, pulse oximetry and re-evaluation of patient's condition.   MDM  MSE complete  [name] was evaluated in Emergency Department on 08/27/2020 for the symptoms described in the history of present illness. He was evaluated in the context of the global COVID-19 pandemic, which necessitated consideration that the patient might be at risk for infection with the SARS-CoV-2 virus that causes COVID-19. Institutional protocols and algorithms that pertain to the evaluation of patients at risk for COVID-19 are in a state of rapid change based on information released by regulatory bodies including the CDC and federal and state organizations. These policies and algorithms were followed during the patient's care in the ED.  Patient presented with complaint of chest pain and shortness of breath with associated A. fib with RVR.  Rate was improved with EMS administered 10 mg of Cardizem.  Cardizem drip initiated in the ED with conversion to normal sinus rhythm shortly thereafter.  Patient reports feeling significantly better after conversion to normal sinus rhythm.  Initial troponins were in the mid 20's.  At [address] [facility] Cardiology Master - Rosann Auerbach - aware of case.  Cardiology will consult on case.  They request medicine for admission for further observation and work-up.  Medicine team is aware of case and will evaluate for admission.   Final Clinical Impression(s) / ED Diagnoses Final diagnoses:  Atrial fibrillation with RVR (HCC)  Chest pain, unspecified type  Dyspnea, unspecified type    Rx / DC Orders ED Discharge Orders     None        Wynetta Fines, MD 08/27/20 1331

## 2020-08-27 NOTE — ED Notes (Signed)
Echocardiogram completed at bedside.

## 2020-08-27 NOTE — ED Notes (Signed)
Attempt to call report to floor, nurse unavailable at this time. 

## 2020-08-27 NOTE — Progress Notes (Signed)
ANTICOAGULATION CONSULT NOTE - Initial Consult  Pharmacy Consult for heparin Indication: atrial fibrillation  No Known Allergies  Patient Measurements: Height: 5\' 10"  (177.8 cm) Weight: 94.3 kg (208 lb) IBW/kg (Calculated) : 73 Heparin Dosing Weight: 92kg  Vital Signs: Temp: 98.8 F (37.1 C) (07/25 1215) Temp Source: Oral (07/25 1215) BP: 114/69 (07/25 1300) Pulse Rate: 78 (07/25 1300)  Labs: Recent Labs    08/27/20 0907 08/27/20 1107  HGB 14.2  --   HCT 42.1  --   PLT 268  --   LABPROT 13.8  --   INR 1.1  --   CREATININE 1.74*  --   TROPONINIHS 27* 23*    Estimated Creatinine Clearance: 46.2 mL/min (A) (by C-G formula based on SCr of 1.74 mg/dL (H)).   Medical History: Past Medical History:  Diagnosis Date   Diabetes mellitus type 2, diet-controlled (HCC)    HLD (hyperlipidemia)    MI (myocardial infarction) (HCC) 1990   at [facility]   Stroke [facility]) 2021   x 4, initially at [facility], others at [facility]; then came to [facility] w/ ?CVA >> blockage found and fixed    Medications:  Infusions:   heparin      Assessment: 69 yom presented to the ED in afib. To start IV heparin. Baseline CBC is WNL and he is not on anticoagulation PTA.   Goal of Therapy:  Heparin level 0.3-0.7 units/ml Monitor platelets by anticoagulation protocol: Yes   Plan:  Heparin bolus 4000 units IV x 1 Heparin gtt 1000 units/hr Check a 6 hr heparin level Daily heparin level and CBC F/u long-term anticoagulation plan  Cainen Burnham, [facility] 08/27/2020,1:29 PM

## 2020-08-27 NOTE — Consult Note (Addendum)
The patient has been seen in conjunction with [name], PAC. All aspects of care have been considered and discussed. The patient has been personally interviewed, examined, and all clinical data has been reviewed.  Brought to ER after calling EMS for diaphoresis, abdominal and chest discomfort, prior diarrhea, and dehydration. AF with RVR was noted. He was given IV fluids and started on diltiazem leading to resolution of AF and symptoms. Says he feels better and currently no chest pain or abdominal pain. Prior vertebrobasilar CVA x 4 with Stent 2021.Has difficulty walking. Prior coronary Stent > 20 years ago at [facility]. Cold feet. Uncertain cause. No pedal pulses are palpable. Needs LE doppler studies. 30 pound weight gain in 5 months, of uncertain cause. Plan: Telemetry; IV heparin; Troponin I trending; General medicine to see about abdominal pain and weight gain; 2 D Doppler echo; further w/u pending data base. AF RVR with CHADS VASC > 2. Will need long-term anticoagulation if no contraindication. For now IV heparin.    Cardiology Consultation:   Patient ID: [name] MRN: [id number]; DOB: 14-Sep-1950  Admit date: 08/27/2020 Date of Consult: 08/27/2020  PCP:  Raymond Basset, PA-C   [facility] Providers Cardiologist:  None        Patient Profile:   [name] is a 70 y.o. 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  History of Present Illness:   [name] had diarrhea on Saturday/Sunday, thinks from a hamburger. Multiple episodes over 24 hr, improved w/ Imodium. Spends weekends at [facility].   Came home yesterday, feeling ok. Last night, about 9 pm, went for a 30"  walk. He had decided to start walking to help him lose weight. He did not have CP during the walk, but had CP when he got back to the house.   It was sharp and stabbing, hard to take a deep breath. 6-7/10. He was diaphoretic, but no N&V. It reminded him of his pre-MI pain. He took an 81 mg ASA.   The pain eased off and he was able to sleep. He got up to BR during the night, no pain. He woke up before 5 am, he was having the pain again, same as before. Did not take any meds. He got up and sat in the chair, no help. Sitting up helped his breathing, but made no difference in the pain. He called EMS.  He got baby ASA x 3. ECG was rapid atrial fib. EMS gave him IVF 1000 cc and Cardizem IV 10 mg. He did not have any palpitations this am, but felt a rapid HR last pm at times. His HR improved and he spontaneously converted to SR.  After rx by EMS, his pain eased off. After arrival to the ER, it resolved and has not come back.  No hx presyncope or syncope, had palpitations a long time ago, none recently.  He says has gained wt since March, wt 208 lbs then. Feels it is from eating too much. Has not weighed, but pants are too tight.   Past Medical History:  Diagnosis Date   Diabetes mellitus type 2, diet-controlled (HCC)    HLD (hyperlipidemia)    MI (myocardial infarction) (HCC) 1990  at [facility]   Stroke [facility](HCC) 2021   x 4, [facility], [facility]; then [facility] w/ ?CVA >> blockage found and fixed    Past Surgical History:  Procedure Laterality Date   CORONARY STENT INTERVENTION     IR ANGIO INTRA EXTRACRAN SEL COM CAROTID INNOMINATE BILAT MOD SED  08/29/2019   IR ANGIO VERTEBRAL SEL SUBCLAVIAN INNOMINATE UNI R MOD SED  08/29/2019   IR ANGIO VERTEBRAL SEL VERTEBRAL UNI L MOD SED  08/29/2019   IR INTRA CRAN STENT  09/01/2019   IR US GUIDE VASC ACCESS RIGHT  08/29/2019   RADIOLOGY WITH ANESTHESIA N/A 09/01/2019   Procedure: IR WITH ANESTHESIA BASILAR ARTERY STENTING;   Surgeon: [name], Sanjeev, MD;  Location: [facility] OR;  Service: Radiology;  Laterality: N/A;     Home Medications:  Prior to Admission medications   Medication Sig Start Date End Date Taking? Authorizing Provider  amLODipine (NORVASC) 2.5 MG tablet Take 2.5 mg by mouth daily. 08/17/19   [provider]  aspirin EC 81 MG tablet Take 81 mg by mouth daily. Swallow whole.    [provider]  atorvastatin (LIPITOR) 80 MG tablet Take 80 mg by mouth daily at 6 PM.  08/17/19   [provider]  carvedilol (COREG) 3.125 MG tablet Take 3.125 mg by mouth 2 (two) times daily. 07/07/19   [provider]  colchicine-probenecid 0.5-500 MG tablet Take one tablet daily for a week, then increase to one tablet BID 03/30/20   [provider]  febuxostat (ULORIC) 40 MG tablet Take 40 mg by mouth daily. 08/17/19   [provider]  FLUoxetine (PROZAC) 10 MG capsule Take 10 mg by mouth daily. 08/17/19   [provider]  Misc Natural Products (TART CHERRY ADVANCED PO) Take 1,200 mg by mouth daily.    [provider]  MITIGARE 0.6 MG CAPS Take 0.6-1.2 mg by mouth 2 (two) times daily as needed (gout flareup).  06/10/19   [provider]  Multiple Vitamins-Minerals (ONE-A-DAY MENS 50+ PO) Take 1 tablet by mouth daily.    [provider]  polyethylene glycol (MIRALAX / GLYCOLAX) 17 g packet Take 17 g by mouth daily as needed for moderate constipation. 09/03/19   Arrien, York RamMauricio Daniel, MD    Inpatient Medications: Scheduled Meds:  carvedilol  6.25 mg Oral BID   Continuous Infusions:   PRN Meds:   Allergies:   No Known Allergies  Social History:   Social History   Socioeconomic History   Marital status: Married    Spouse name: [name]   Number of children: Not on file   Years of education: Not on file   Highest education level: Not on file  Occupational History   Not on file  Tobacco Use   Smoking status: Never   Smokeless  tobacco: Never  Substance and Sexual Activity   Alcohol use: Not Currently   Drug use: Never   Sexual activity: Not on file  Other Topics Concern   Not on file  Social History Narrative   Lives with wife [name]   Right Handed   Drinks 1-2 cups caffeine   Social Determinants of Health   Financial Resource Strain: Not on file  Food Insecurity: Not on file  Transportation Needs: Not on file  Physical Activity: Not on file  Stress: Not on file  Social Connections: Not on file  Intimate Partner Violence: Not on file    Family History:  Family History  Problem Relation Age of Onset   Heart disease Father      ROS:  Please see the history of present illness.  All other ROS reviewed and negative.     Physical Exam/Data:   Vitals:   08/27/20 1130 08/27/20 1215 08/27/20 1230 08/27/20 1300  BP: (!) 110/56 109/60 104/66 114/69  Pulse: 78 79 79 78  Resp: (!) Temp:  98.8 F (37.1 C)    TempSrc:  Oral    SpO2: 96% 97% 96% 96%    Intake/Output Summary (Last 24 hours) at 08/27/2020 1316 Last data filed at 08/27/2020 1152 Gross per 24 hour  Intake 1009.66 ml  Output --  Net 1009.66 ml   Last 3 Weights 04/09/2020 01/05/2020 08/27/2019  Weight (lbs) 208 lb 202 lb 240 lb  Weight (kg) 94.348 kg 91.627 kg 108.863 kg     There is no height or weight on file to calculate BMI.  General:  Well nourished, well developed, in mild respiratory distress HEENT: normal Lymph: no adenopathy Neck: no JVD seen, difficult to assess 2nd body habitus Endocrine:  No thryomegaly Vascular: No carotid bruits; upper extremity pulses 2+ bilaterally, DP/PT pulses difficult to assess, no femoral bruits  Cardiac:  normal S1, S2; RRR; no murmur  Lungs: decreased BS bases bilaterally, upper airway wheeze, no rhonchi, few rales  Abd: soft, nontender, no hepatomegaly  Ext: no edema Musculoskeletal:  No deformities, BUE and BLE strength normal and equal Skin: warm and dry  Neuro:  CNs 2-12  intact, no focal abnormalities noted Psych:  Normal affect   EKG:  The EKG was personally reviewed and demonstrates:  09:12 am ECG is Atrial fib, RVR, HR 137  Telemetry:  Telemetry was personally reviewed and demonstrates:  Atrial fib, RVR >> SR at 9:15 am. PVCs and 3 bts NSVT noted. RBBB is old  Relevant CV Studies:  ECHO: 08/27/2019  1. Left ventricular ejection fraction, by estimation, is 45 to 50%. The left ventricle has mildly decreased function. The left ventricle  demonstrates global hypokinesis. There is mild concentric left ventricular hypertrophy. Left ventricular diastolic parameters are indeterminate.   2. Right ventricular systolic function is normal. The right ventricular size is normal.   3. The mitral valve is normal in structure. Trivial mitral valve  regurgitation. No evidence of mitral stenosis.   4. The aortic valve is tricuspid. Aortic valve regurgitation is mild.   5. The inferior vena cava is normal in size with greater than 50%  respiratory variability, suggesting right atrial pressure of 3 mmHg.   Conclusion(s)/Recommendation(s): No intracardiac source of embolism detected on this transthoracic study. A transesophageal echocardiogram is recommended to exclude cardiac source of embolism if clinically indicated.   ECHO: 08/01/2019 There is mild concentric left ventricular hypertrophy. Wall motion  assessment is limited due to poor endocardial border delineation, but  I suspect that there is distal anteroseptal and apical hypokinesis vs  akinesis and mildly reduced systolic function. LVEF cannot be  reasonably determined on this study. Recommend Definity contrast study  for further evaluation.  The right ventricle is normal in size and function.  There is mild aortic valve thickening with mild aortic regurgitation.  RVSP not able to be calculated.   There is no comparison study available.   LIMITED ECHO: 05/16/2019 SUMMARY  Image Quality: Technically  difficult.  There is apical LV anterior wall mild hypokinesis  LV ejection fraction = 50-55%.  There is mild concentric left ventricular  hypertrophy.  Injection of agitated saline showed no right-to-left shunt.  Compared to the last study report LVEF may have improved    Laboratory Data:  High Sensitivity Troponin:   Recent Labs  Lab 08/27/20 0907 08/27/20 1107  TROPONINIHS 27* 23*     Chemistry Recent Labs  Lab 08/27/20 0907  NA 137  K 3.7  CL 106  CO2 23  GLUCOSE 136*  BUN 17  CREATININE 1.74*  CALCIUM 8.2*  GFRNONAA 42*  ANIONGAP 8    Recent Labs  Lab 08/27/20 0907  PROT 6.4*  ALBUMIN 2.6*  AST 28  ALT 22  ALKPHOS 107  BILITOT 1.1   Hematology Recent Labs  Lab 08/27/20 0907  WBC 13.7*  RBC 4.43  HGB 14.2  HCT 42.1  MCV 95.0  MCH 32.1  MCHC 33.7  RDW 14.6  PLT 268   BNPNo results for input(s): BNP, PROBNP in the last 168 hours.  DDimer No results for input(s): DDIMER in the last 168 hours. Lab Results  Component Value Date   HGBA1C 6.2 (H) 08/27/2019   No results found for: TSH Lab Results  Component Value Date   CHOL 96 08/27/2019   HDL 22 (L) 08/27/2019   LDLCALC 55 08/27/2019   TRIG 96 08/27/2019   CHOLHDL 4.4 08/27/2019     Radiology/Studies:  DG Chest Port 1 View  Result Date: 08/27/2020 CLINICAL DATA:  Dyspnea. EXAM: PORTABLE CHEST 1 VIEW COMPARISON:  July 31, 2019. FINDINGS: The heart size and mediastinal contours are within normal limits. Both lungs are clear. The visualized skeletal structures are unremarkable. IMPRESSION: No active disease. Electronically Signed   By: Lupita Raider M.D.   On: 08/27/2020 09:34     Assessment and Plan:   Chest pain: - troponin mildly elevated, c/w rapid HR - CP may have been 2nd afib RVR as it improved and resolved w/ improvement in HR - however, pt w/ sig CAD history - ck echo - since no sig trop elevation, MD advise on inpt eval vs outpt MV, especially since Cr above baseline  2.  PAF - unknown duration, but suspect started last pm after exercising - converted to SR after bolus IV Cardizem 10 mg by EMS, drip currently running at 5 mg/hr - is on Coreg 3.125 mg bid, will increase to 6.25 mg bid and stop Cardizem - CHA2DS2-VASc = 6 - start heparin, convert to Eliquis once invasive eval ruled out  3. HTN - takes amlodipine 2.5 mg, Coreg 3.125 mg qd at home - increase Coreg and hold amlodipine  4. HLD - on Lipitor 80 mg, says is compliant - LDL at goal at 55 but HDL too low at 22 a year ago - repeat profile  5. SOB - pt w/ increased resp rate and feels SOB - says abd girth has increased and wt is up, does not know how much - will need daily wts, see if can get here -  ck BNP, d-dimer - CXR is ok, O2 sats ok - may be related to body habitus - follow sx  6. ?PAD - no reports of claudication sx - cap refill slightly delayed and difficult to palpate DP/PT pulses - discuss plan w/ MD   Risk Assessment/Risk Scores:     HEAR Score (for undifferentiated chest pain):  HEAR Score: 5    CHA2DS2-VASc Score = 6  This indicates a 9.7% annual risk of stroke. The patient's score is based upon: CHF History: No HTN History: Yes  Diabetes History: Yes Stroke History: Yes Vascular Disease History: Yes Age Score: 1 Gender Score: 0    For questions or updates, please contact [facility] Please consult [email or web address] for contact info under    Signed, Raymond Demark, PA-C  08/27/2020 1:16 PM

## 2020-08-27 NOTE — Progress Notes (Signed)
ANTICOAGULATION CONSULT NOTE - Follow Up Consult  Pharmacy Consult for heparin Indication:  Afib and r/o PE/ACS   Labs: Recent Labs    08/27/20 0907 08/27/20 1107 08/27/20 2120  HGB 14.2  --   --   HCT 42.1  --   --   PLT 268  --   --   LABPROT 13.8  --   --   INR 1.1  --   --   HEPARINUNFRC  --   --  <0.10*  CREATININE 1.74*  --   --   TROPONINIHS 27* 23*  --     Assessment: 69yo male subtherapeutic on heparin with initial dosing for new Afib w/ possible PE vs ACS; no infusion issues or signs of bleeding per RN.  Goal of Therapy:  Heparin level 0.3-0.7 units/ml   Plan:  Will rebolus with heparin 3000 units and increase heparin infusion by 4 units/kg/hr to 1400 units/hr and check level in 8 hours.    Vernard Gambles, PharmD, BCPS  08/27/2020,10:54 PM

## 2020-08-28 ENCOUNTER — Observation Stay (HOSPITAL_COMMUNITY): Payer: Medicare HMO

## 2020-08-28 DIAGNOSIS — I4891 Unspecified atrial fibrillation: Secondary | ICD-10-CM | POA: Diagnosis not present

## 2020-08-28 DIAGNOSIS — E1122 Type 2 diabetes mellitus with diabetic chronic kidney disease: Secondary | ICD-10-CM

## 2020-08-28 DIAGNOSIS — N183 Chronic kidney disease, stage 3 unspecified: Secondary | ICD-10-CM

## 2020-08-28 DIAGNOSIS — I5042 Chronic combined systolic (congestive) and diastolic (congestive) heart failure: Secondary | ICD-10-CM

## 2020-08-28 DIAGNOSIS — N170 Acute kidney failure with tubular necrosis: Secondary | ICD-10-CM | POA: Diagnosis not present

## 2020-08-28 DIAGNOSIS — R0600 Dyspnea, unspecified: Secondary | ICD-10-CM

## 2020-08-28 DIAGNOSIS — N189 Chronic kidney disease, unspecified: Secondary | ICD-10-CM | POA: Diagnosis present

## 2020-08-28 DIAGNOSIS — R778 Other specified abnormalities of plasma proteins: Secondary | ICD-10-CM

## 2020-08-28 DIAGNOSIS — N179 Acute kidney failure, unspecified: Secondary | ICD-10-CM | POA: Diagnosis present

## 2020-08-28 LAB — URINALYSIS, ROUTINE W REFLEX MICROSCOPIC
Bilirubin Urine: NEGATIVE
Glucose, UA: NEGATIVE mg/dL
Ketones, ur: NEGATIVE mg/dL
Leukocytes,Ua: NEGATIVE
Nitrite: NEGATIVE
Protein, ur: 100 mg/dL — AB
RBC / HPF: >50 RBC/hpf — ABNORMAL HIGH (ref 0–5)
Specific Gravity, Urine: 1.023 (ref 1.005–1.030)
pH: 5 (ref 5.0–8.0)

## 2020-08-28 LAB — CREATININE, URINE, RANDOM: Creatinine, Urine: 289.33 mg/dL

## 2020-08-28 LAB — CBC
HCT: 36.3 % — ABNORMAL LOW (ref 39.0–52.0)
Hemoglobin: 12.1 g/dL — ABNORMAL LOW (ref 13.0–17.0)
MCH: 31.9 pg (ref 26.0–34.0)
MCHC: 33.3 g/dL (ref 30.0–36.0)
MCV: 95.8 fL (ref 80.0–100.0)
Platelets: 215 10*3/uL (ref 150–400)
RBC: 3.79 MIL/uL — ABNORMAL LOW (ref 4.22–5.81)
RDW: 14.9 % (ref 11.5–15.5)
WBC: 11.6 10*3/uL — ABNORMAL HIGH (ref 4.0–10.5)
nRBC: 0 % (ref 0.0–0.2)

## 2020-08-28 LAB — BASIC METABOLIC PANEL
Anion gap: 8 (ref 5–15)
BUN: 21 mg/dL (ref 8–23)
CO2: 23 mmol/L (ref 22–32)
Calcium: 8.2 mg/dL — ABNORMAL LOW (ref 8.9–10.3)
Chloride: 106 mmol/L (ref 98–111)
Creatinine, Ser: 1.85 mg/dL — ABNORMAL HIGH (ref 0.61–1.24)
GFR, Estimated: 39 mL/min — ABNORMAL LOW (ref >60)
Glucose, Bld: 134 mg/dL — ABNORMAL HIGH (ref 70–99)
Potassium: 3.4 mmol/L — ABNORMAL LOW (ref 3.5–5.1)
Sodium: 137 mmol/L (ref 135–145)

## 2020-08-28 LAB — HIV ANTIBODY (ROUTINE TESTING W REFLEX): HIV Screen 4th Generation wRfx: NONREACTIVE

## 2020-08-28 LAB — TSH: TSH: 1.988 u[IU]/mL (ref 0.350–4.500)

## 2020-08-28 LAB — HEPARIN LEVEL (UNFRACTIONATED): Heparin Unfractionated: 0.33 IU/mL (ref 0.30–0.70)

## 2020-08-28 LAB — SODIUM, URINE, RANDOM: Sodium, Ur: <10 mmol/L

## 2020-08-28 IMAGING — NM NM PULMONARY PERF PARTICULATE
8 series · 8 of 8 positions shown · non-contrast
Comparison: Chest x-ray [DATE].

CLINICAL DATA: Suspected P. Positive D-dimer.

EXAM:
NUCLEAR MEDICINE PERFUSION LUNG SCAN
TECHNIQUE: Perfusion images were obtained in multiple projections after
intravenous injection of radiopharmaceutical.
Ventilation scans intentionally deferred if perfusion scan and chest
x-ray adequate for interpretation during COVID 19 epidemic.
RADIOPHARMACEUTICALS:  4.4 mCi [RF] MAA IV

[Series 1: ant/post perf · 4.14mm/px · 1 of 1 slices shown (1 of 2)]
[im 1/1]
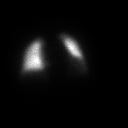

[Series 1: ant/post perf · 4.14mm/px · 1 of 1 slices shown (2 of 2)]
[im 1/1]
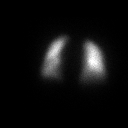

[Series 2: lao/rpo perf · 4.14mm/px · 1 of 1 slices shown (1 of 2)]
[im 1/1]
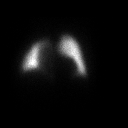

[Series 2: lao/rpo perf · 4.14mm/px · 1 of 1 slices shown (2 of 2)]
[im 1/1]
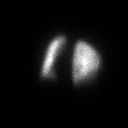

[Series 3: lpo/rao perf · 4.14mm/px · 1 of 1 slices shown (1 of 2)]
[im 1/1]
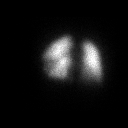

[Series 3: lpo/rao perf · 4.14mm/px · 1 of 1 slices shown (2 of 2)]
[im 1/1]
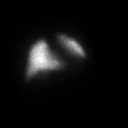

[Series 4: lt lat/rt lat perf · 4.14mm/px · 1 of 1 slices shown (1 of 2)]
[im 1/1]
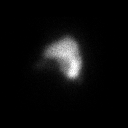

[Series 4: lt lat/rt lat perf · 4.14mm/px · 1 of 1 slices shown (2 of 2)]
[im 1/1]
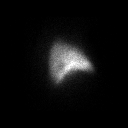

[8 of 8 positions shown; findings below may reference images not displayed]

FINDINGS: Findings again noted consistent with cardiomegaly. A faint perfusion
defect on the left cannot be excluded. Pulmonary embolus cannot be
excluded.
IMPRESSION: Cardiomegaly. A faint perfusion defect on the left cannot be
excluded. Pulmonary embolus cannot be excluded.

## 2020-08-28 IMAGING — CT CT ABD-PELV W/O CM
2 of 4 series · 17 of 46 positions shown, 19 images · non-contrast
Comparison: None.

CLINICAL DATA: Gross hematuria.

EXAM:
CT ABDOMEN AND PELVIS WITHOUT CONTRAST
TECHNIQUE: Multidetector CT imaging of the abdomen and pelvis was performed
following the standard protocol without IV contrast.

[Series 3: ap without · axial · non-contrast · 0.83mm/px · z∈[-281,+139]mm · 14 of 94 slices shown, 16 images]
[im 5/94  soft-tissue]
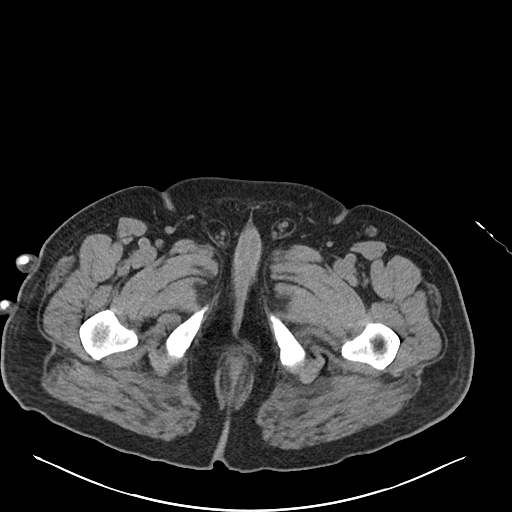
[im 5/94  bone]
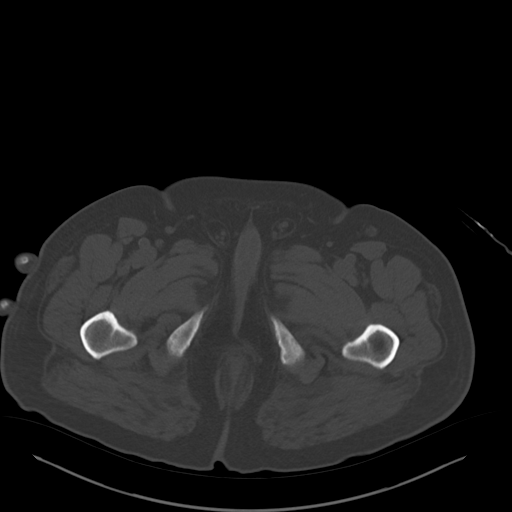
[im 14/94  soft-tissue]
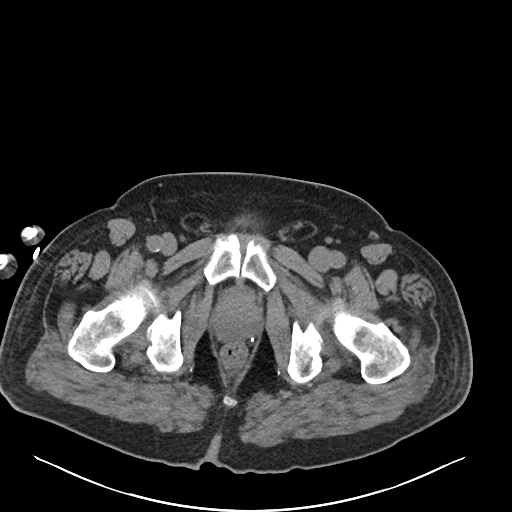
[im 18/94  soft-tissue]
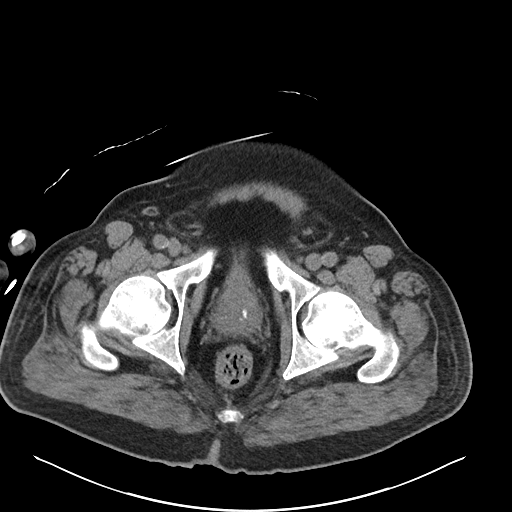
[im 27/94  soft-tissue]
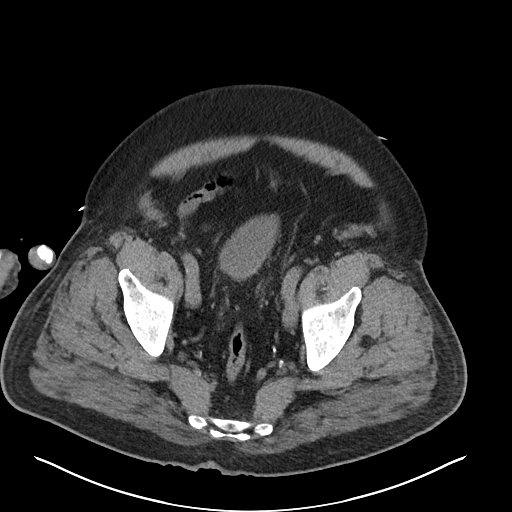
[im 32/94  soft-tissue]
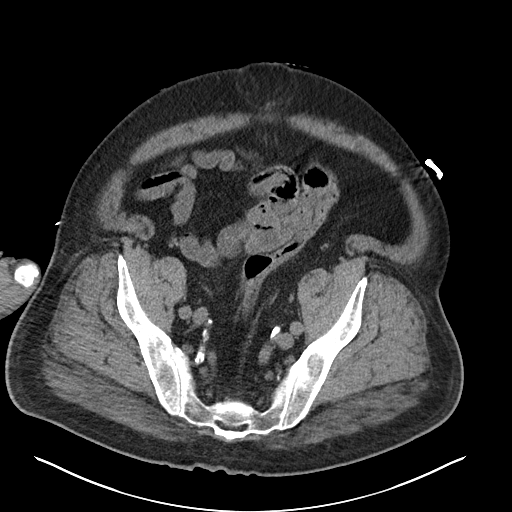
[im 36/94  soft-tissue]
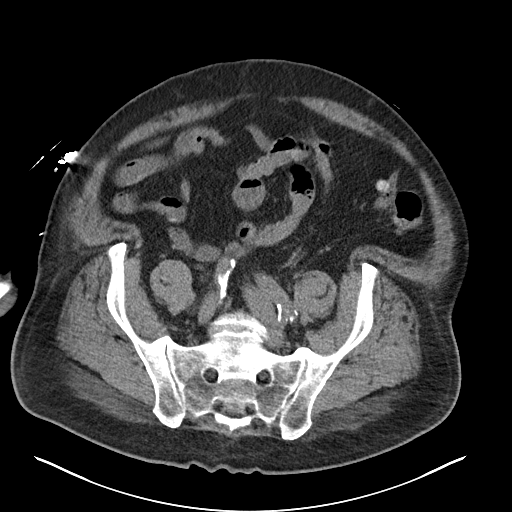
[im 45/94  soft-tissue]
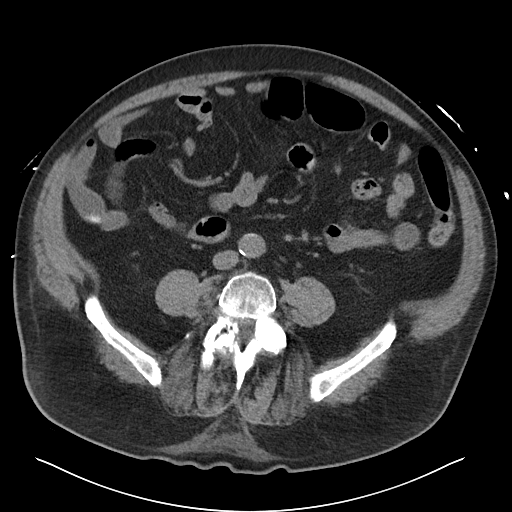
[im 49/94  soft-tissue]
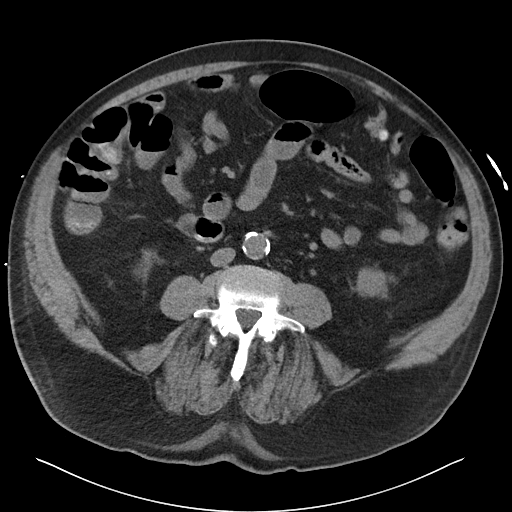
[im 58/94  soft-tissue]
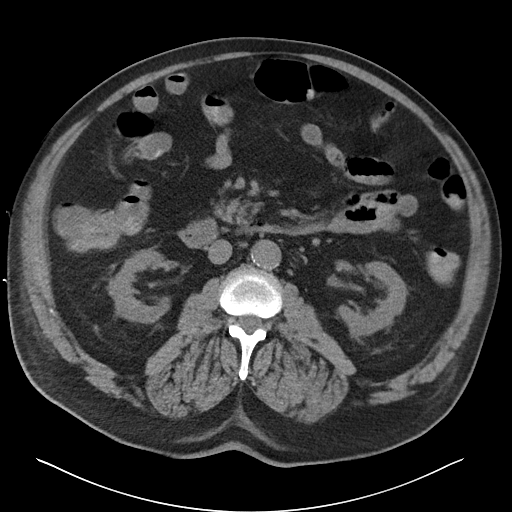
[im 58/94  bone]
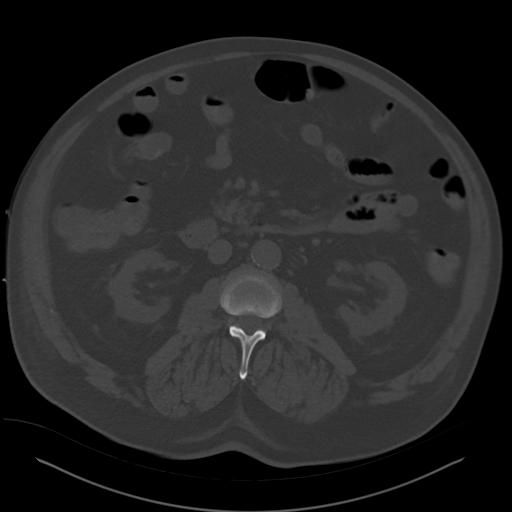
[im 63/94  soft-tissue]
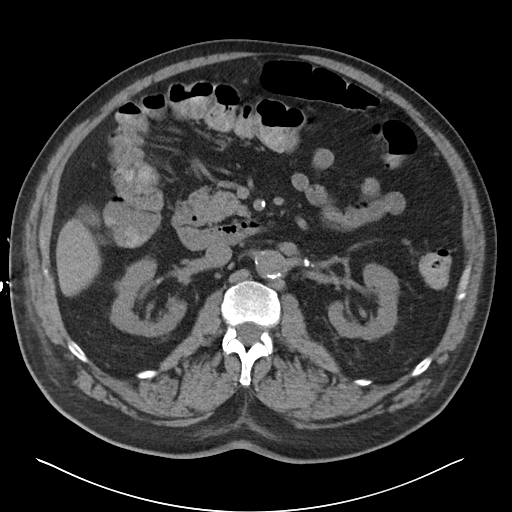
[im 71/94  soft-tissue]
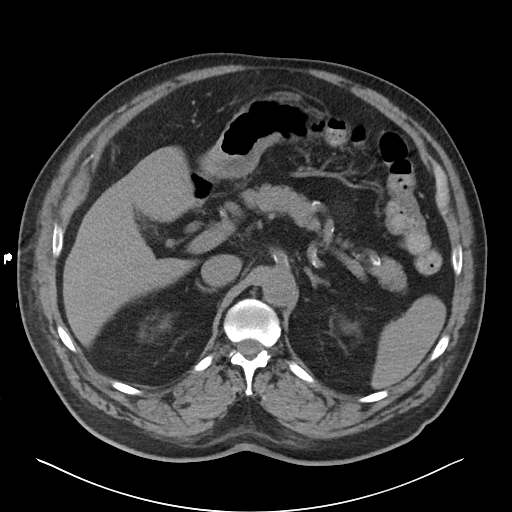
[im 76/94  soft-tissue]
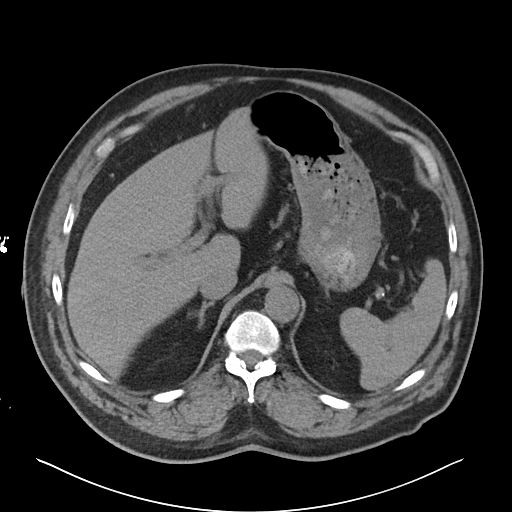
[im 80/94  soft-tissue]
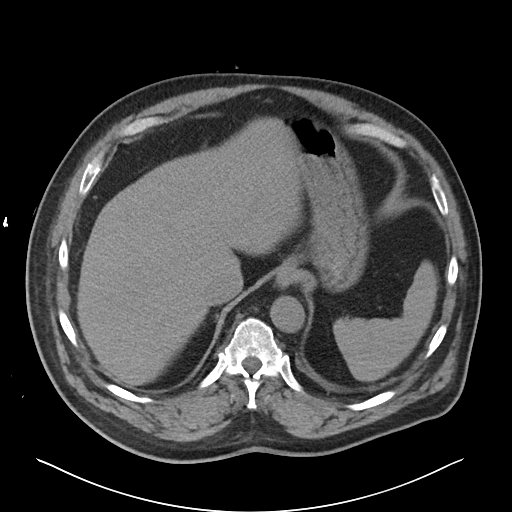
[im 89/94  soft-tissue]
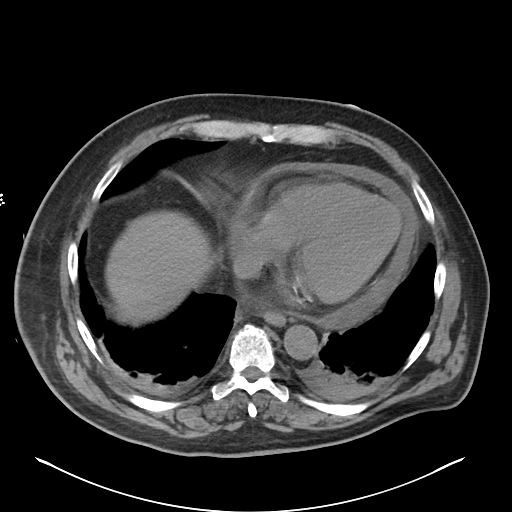

[Series 6: cor · coronal · 0.87mm/px · 3 of 128 slices shown]
[im 43/128  soft-tissue]
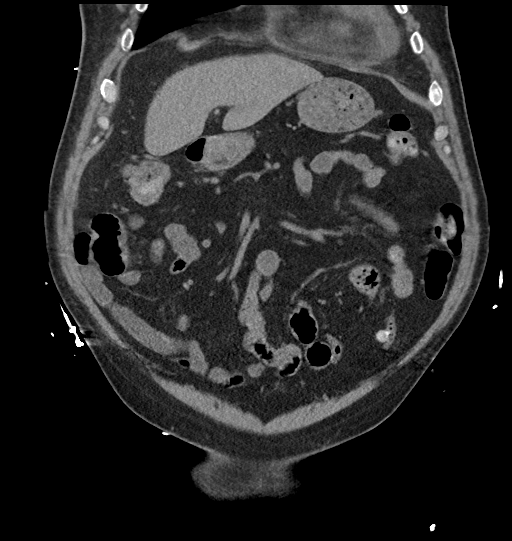
[im 57/128  soft-tissue]
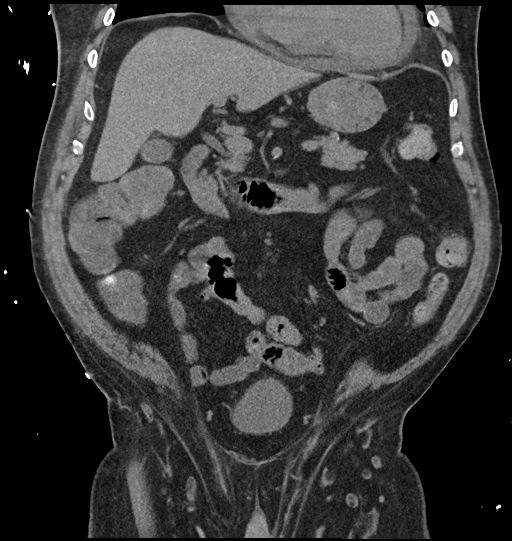
[im 71/128  soft-tissue]
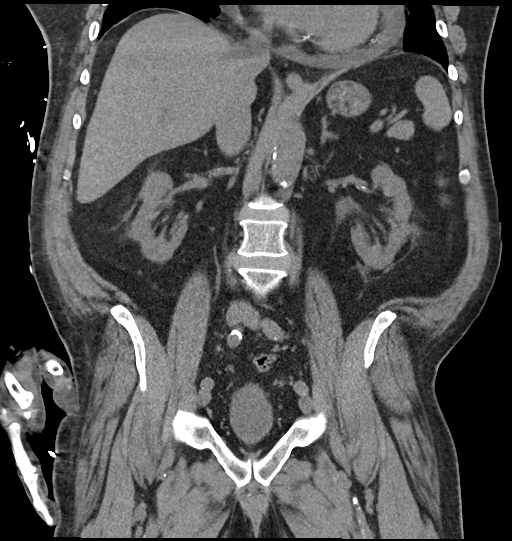

[17 of 46 positions shown; findings below may reference images not displayed]

FINDINGS: Lower chest: Mild bilateral posterior basilar subsegmental
atelectasis is noted. Mild pericardial effusion is noted.

Hepatobiliary: Cholelithiasis is noted. No biliary dilatation is
noted. The liver is unremarkable.

Pancreas: Unremarkable. No pancreatic ductal dilatation or
surrounding inflammatory changes.

Spleen: Normal in size without focal abnormality.

Adrenals/Urinary Tract: Adrenal glands appear normal. Right renal
cyst is noted. Nonobstructive bilateral nephrolithiasis is noted.
Right renal cyst is noted. No hydronephrosis or renal obstruction is
noted. Urinary bladder is unremarkable.

Stomach/Bowel: Stomach is within normal limits. Appendix appears
normal. No evidence of bowel wall thickening, distention, or
inflammatory changes.

Vascular/Lymphatic: Aortic atherosclerosis. No enlarged abdominal or
pelvic lymph nodes.

Reproductive: Prostate is unremarkable.

Other: Moderate size fat containing periumbilical hernia is noted.
No ascites is noted.

Musculoskeletal: No acute or significant osseous findings.
IMPRESSION: Bilateral nonobstructive nephrolithiasis. No hydronephrosis or renal
obstruction is noted.

Cholelithiasis.

Moderate size fat containing periumbilical hernia.

Mild pericardial effusion.

Aortic Atherosclerosis ([9N]-[9N]).

## 2020-08-28 MED ORDER — POTASSIUM CHLORIDE CRYS ER 20 MEQ PO TBCR
40.0000 meq | EXTENDED_RELEASE_TABLET | Freq: Once | ORAL | Status: AC
  Administered 2020-08-28: 40 meq via ORAL
  Filled 2020-08-28: qty 2

## 2020-08-28 MED ORDER — SODIUM CHLORIDE 0.9 % IV BOLUS
500.0000 mL | Freq: Once | INTRAVENOUS | Status: AC
  Administered 2020-08-28: 500 mL via INTRAVENOUS

## 2020-08-28 MED ORDER — SODIUM CHLORIDE 0.9 % IV SOLN: INTRAVENOUS | Status: AC

## 2020-08-28 MED ORDER — DAPAGLIFLOZIN PROPANEDIOL 10 MG PO TABS
10.0000 mg | ORAL_TABLET | Freq: Every day | ORAL | Status: DC
  Administered 2020-08-28 – 2020-09-04 (×8): 10 mg via ORAL
  Filled 2020-08-28 (×9): qty 1

## 2020-08-28 MED ORDER — TECHNETIUM TO 99M ALBUMIN AGGREGATED
4.4000 | Freq: Once | INTRAVENOUS | Status: AC | PRN
  Administered 2020-08-28: 4.4 via INTRAVENOUS

## 2020-08-28 MED ORDER — APIXABAN 5 MG PO TABS
5.0000 mg | ORAL_TABLET | Freq: Two times a day (BID) | ORAL | Status: DC
  Administered 2020-08-28 – 2020-09-04 (×15): 5 mg via ORAL
  Filled 2020-08-28 (×15): qty 1

## 2020-08-28 NOTE — Progress Notes (Addendum)
ANTICOAGULATION CONSULT NOTE  Pharmacy Consult for heparin Indication: atrial fibrillation  No Known Allergies  Patient Measurements: Height: 5\' 10"  (177.8 cm) Weight: 100 kg (220 lb 7.4 oz) IBW/kg (Calculated) : 73 Heparin Dosing Weight: 92kg  Vital Signs: Temp: 98.8 F (37.1 C) (07/26 0811) Temp Source: Oral (07/26 0811) BP: 110/71 (07/26 0811) Pulse Rate: 81 (07/26 0811)  Labs: Recent Labs    08/27/20 0907 08/27/20 1107 08/27/20 2120 08/28/20 0443 08/28/20 0721  HGB 14.2  --   --  12.1*  --   HCT 42.1  --   --  36.3*  --   PLT 268  --   --  215  --   LABPROT 13.8  --   --   --   --   INR 1.1  --   --   --   --   HEPARINUNFRC  --   --  <0.10*  --  0.33  CREATININE 1.74*  --   --  1.85*  --   TROPONINIHS 27* 23*  --   --   --      Estimated Creatinine Clearance: 44.7 mL/min (A) (by C-G formula based on SCr of 1.85 mg/dL (H)).   Medical History: Past Medical History:  Diagnosis Date   Diabetes mellitus type 2, diet-controlled (HCC)    HLD (hyperlipidemia)    MI (myocardial infarction) (HCC) 1990   at [facility]   Stroke [facility]) 2021   x 4, initially at [facility], others at [facility]; then came to [facility] w/ ?CVA >> blockage found and fixed    Medications:  Infusions:   heparin 1,400 Units/hr (08/27/20 2301)    Assessment: 69 yom presented to the ED in afib. To start IV heparin. Baseline CBC is WNL and he is not on anticoagulation PTA.   Heparin level this morning came back therapeutic at 0.33, on 1400 units/hr. No s/sx of bleeding or infusion issues noted. CBC stable.   Goal of Therapy:  Heparin level 0.3-0.7 units/ml Monitor platelets by anticoagulation protocol: Yes   Plan:  Continue heparin gtt at 1400 units/hr Check a 6 hr heparin level Daily heparin level and CBC F/u long-term anticoagulation plan  08/29/20, PharmD, BCCCP Clinical Pharmacist  Phone: [phone] 08/28/2020 9:16 AM  Please check AMION for all [facility] Pharmacy phone  numbers After 10:00 PM, call Main Pharmacy [phone]  ADDENDUM Okay to change to apxiaban - will stop heparin infusion and give apixaban 5 mg BID (age<80, wt>60 kg despite Scr>1.5). Monitor for s/sx of bleeding.   [phone], PharmD, BCCCP Clinical Pharmacist

## 2020-08-28 NOTE — Progress Notes (Signed)
Requested benefit check for Apixaban 5 mg BID and farxiga 10 mg daily cost.  Joaquin Courts, MSW, [facility]

## 2020-08-28 NOTE — Hospital Course (Addendum)
8/2: Tolerating food, just didn't eat breakfast. PT to work w him today for home health  Rash: steroid cream improved itchiness (likely a heat rash from bed-- nonspecific dermatitis)  Euvolemic  Coreg decreased to 12.53m BID    Outpatient: -sleep study -repeat ESR and CRP in 2/3 weeks -repeat echo in 3/4 weeks  -rheum workup -ischemic work up  Mr. Raymond Lohris a 89yoM with a PMHX of MI s/p stent (1990), diet controlled T2DM, CKD, HTN, HLD, CVA x4, HFrEF (EF 45%-50%) and obesity [facility] non[facility]radiating sternal chest pain and exertional dyspnea. He was found to have atrial fibrillation with RVR in the ED which was converted to sinus rhythm using IV diltiazem. He was then transitioned and rate controlled using carvedilol 6.25 mg bid. During his admission, he was found to have elevated d-dimers and later was febrile and hypoxic requiring 2LNC. V/Q scan on 7/25 showed a faint perfusion defect which could not r/o a PE. He then got a CTA on *** which showed ***. He was anticoagulated initially using heparin then transitioned to Eliquis on 7/26.  He was also noted to have an AKI on CKD. He had gross hematuria and u/a showed protein, hgb and RBC. Urine sediment showed ***. CT abdomen on *** showed ***.  He was resumed on his home gout ppx of provenecid-colchicine 500-0.5 mg per tablet QD at discharge***

## 2020-08-28 NOTE — Progress Notes (Signed)
  Date: 08/28/2020  Patient name: [name]  Medical record number: [id number]  Date of birth: May 20, 1950   I have seen and evaluated Gillis Ends and discussed their care with the Residency Team. Briefly, Mr. 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  His son notes this started while he was walking around the block.  He further complained of palpitations and diaphoresis which improved with rest and medications given by EMS.  He was found to be in Afib with RVR.  He was initially started on diltiazem ggt which was changed to PO metoprolol and today to carvedilol.  Since admission, he has had a low grade fever, new oxygen demand (requiring 2LNC) and was SOB during exam today.  His D Dimer was elevated in the ED.  VQ scan done, due to renal function, was indeterminate.  He was also noted to develop hematuria when started on heparin for Afib and presumed blood clot.  He has no lower extremity edema, pain or long car travel.   Review of previous admissions/visits for stroke:  05/14/19: PCA territory stroke, planned for Zio Patch 06/06/19: Repeat symptoms, plavix asp, plan for Zio Patch 07/31/19: Slurred speech, recommended indefinite plavix aspirin at that time 08/03/19: Rehab admission 08/26/19: CVA, started on ticagrelor and aspirin, VBJ stent placed 01/05/20: OP follow up with neuro; planned for ticagrelor and aspirin for 2 months then stop ticagrelor 04/09/20: Follow up neuro OP  Vitals:   08/28/20 0811 08/28/20 1221  BP: 110/71 91/64  Pulse: 81 79  Resp: 18 18  Temp: 98.8 F (37.1 C) 97.6 F (36.4 C)  SpO2: 93% 95%   Gen: Elderly man, sitting up in bed, abdominal breathing Eyes: Anicteric sclerae HENT: Neck is supple, MMM CV: Tachycardic, irreg irreg, no murmur noted, he has no pedal edema Pulm: Decreased breath sounds in the left base, otherwise without crackles or wheezes Abd: Obese, +BS, NT, distended but soft MSK: Normal  tone and bulk Psych: Pleasant, normal mood.    Assessment and Plan: I have seen and evaluated the patient as outlined above. I agree with the formulated Assessment and Plan as detailed in the residents' note, with the following changes:   1. Afib with RVR - Improved with beta blocker - Will be transitioned to Apixaban today - Monitor on telemetry - Continues to be hypoxic, SOB and increased RR despite improvement in symptoms  2. Elevated D Dimer, fever, SOB, hypoxia - VQ scan indeterminate - Hydrate and we will try to get a CTA of the chest tomorrow - Follow up cultures - Get urine cultures if not already obtained  3. Hematuria related to heparin initiation - Also complained of flank pain overnight - CT abdomen to evaluate for renal cyst or nephrolithiasis  Other issues per resident/student note from today.   Inez Catalina, MD 7/26/20222:42 PM

## 2020-08-28 NOTE — TOC Benefit Eligibility Note (Signed)
Transition of Care [facility]) Benefit Eligibility Note    Patient Details  Name: [name] MRN: [id number] Date of Birth: Aug 23, 1950   Medication/Dose: Arne Cleveland  5 MG BID  Covered?: Yes  Tier: 3 Drug  Prescription Coverage Preferred Pharmacy: [facility] with Person/Company/Phone Number:: Samuel Jester  @  [employer]   #  [phone]  Co-Pay: $ 45.00  Prior Approval: No  Deductible: Met (OUT-OF-POCKET : UNMET)  Additional Notes: FARXIGA 10 MG DAILY   :  [place]- $95.00     TIER-4 DRUG    P/A-NO    Memory Argue Phone Number: 08/28/2020, 5:30 PM

## 2020-08-28 NOTE — Discharge Instructions (Signed)
Information on my medicine - ELIQUIS® (apixaban) ° °Why was Eliquis® prescribed for you? °Eliquis® was prescribed for you to reduce the risk of a blood clot forming that can cause a stroke if you have a medical condition called atrial fibrillation (a type of irregular heartbeat). ° °What do You need to know about Eliquis® ? °Take your Eliquis® TWICE DAILY - one tablet in the morning and one tablet in the evening with or without food. If you have difficulty swallowing the tablet whole please discuss with your pharmacist how to take the medication safely. ° °Take Eliquis® exactly as prescribed by your doctor and DO NOT stop taking Eliquis® without talking to the doctor who prescribed the medication.  Stopping may increase your risk of developing a stroke.  Refill your prescription before you run out. ° °After discharge, you should have regular check-up appointments with your healthcare provider that is prescribing your Eliquis®.  In the future your dose may need to be changed if your kidney function or weight changes by a significant amount or as you get older. ° °What do you do if you miss a dose? °If you miss a dose, take it as soon as you remember on the same day and resume taking twice daily.  Do not take more than one dose of ELIQUIS at the same time to make up a missed dose. ° °Important Safety Information °A possible side effect of Eliquis® is bleeding. You should call your healthcare provider right away if you experience any of the following: °? Bleeding from an injury or your nose that does not stop. °? Unusual colored urine (red or dark [place]) or unusual colored stools (red or black). °? Unusual bruising for unknown reasons. °? A serious fall or if you hit your head (even if there is no bleeding). ° °Some medicines may interact with Eliquis® and might increase your risk of bleeding or clotting while on Eliquis®. To help avoid this, consult your healthcare provider or pharmacist prior to using any new  prescription or non-prescription medications, including herbals, vitamins, non-steroidal anti-inflammatory drugs (NSAIDs) and supplements. ° °This website has more information on Eliquis® (apixaban): http://www.eliquis.com/eliquis/home ° °

## 2020-08-28 NOTE — Care Management Obs Status (Signed)
MEDICARE OBSERVATION STATUS NOTIFICATION   Patient Details  Name: [name] MRN: [id number] Date of Birth: 1950-08-18   Medicare Observation Status Notification Given:  Yes    Mearl Latin, LCSW 08/28/2020, 5:07 PM

## 2020-08-28 NOTE — Progress Notes (Addendum)
Subjective:  [name], patient had dark urine with small blood clots. Patient also desated below <92% w/exertion for which he was placed [name] [facility]. Mild fever peaking at 100.6oF. Patient continues to have red colored urine this morning. Wearing 2LNC and reporting no SOB or chest pain. Talked with wife this afternoon about his diet at home.   Objective:  Vital signs in last 24 hours: Vitals:   08/28/20 0118 08/28/20 0400 08/28/20 0559 08/28/20 0811  BP: 100/64 109/66  110/71  Pulse: 82 83  81  Resp: 18 19  18   Temp: (!) 100.4 F (38 C) (!) 100.6 F (38.1 C)  98.8 F (37.1 C)  TempSrc: Oral Oral  Oral  SpO2: 92% 95%  93%  Weight:   100 kg   Height:       Weight change:   Intake/Output Summary (Last 24 hours) at 08/28/2020 0847 Last data filed at 08/28/2020 0655 Gross per 24 hour  Intake 1549.69 ml  Output 400 ml  Net 1149.69 ml   CBC    Component Value Date/Time   WBC 11.6 (H) 08/28/2020 0443   RBC 3.79 (L) 08/28/2020 0443   HGB 12.1 (L) 08/28/2020 0443   HCT 36.3 (L) 08/28/2020 0443   PLT 215 08/28/2020 0443   MCV 95.8 08/28/2020 0443   MCH 31.9 08/28/2020 0443   MCHC 33.3 08/28/2020 0443   RDW 14.9 08/28/2020 0443   LYMPHSABS 0.9 08/27/2020 0907   MONOABS 1.5 (H) 08/27/2020 0907   EOSABS 0.2 08/27/2020 0907   BASOSABS 0.0 08/27/2020 0907   BMP Latest Ref Rng & Units 08/28/2020 08/27/2020 09/03/2019  Glucose 70 - 99 mg/dL 09/05/2019) 725(D) 664(Q)  BUN 8 - 23 mg/dL 21 17 18   Creatinine 0.61 - 1.24 mg/dL 034(V) ) 4.25(Z)  Sodium 135 - 145 mmol/L 137 137 139  Potassium 3.5 - 5.1 mmol/L 3.4(L) 3.7 3.2(L)  Chloride 98 - 111 mmol/L 106 106 107  CO2 22 - 32 mmol/L 23 23 22   Calcium 8.9 - 10.3 mg/dL 8.2(L) 8.2(L) 8.6(L)    Physical exam: General: Well appearing man sitting up in bed Cardiovascular: irregular rhythm and no murmurs noted [name] ausculation  Pulm: CTAB, w/o wheezing  Abdomen: Abdomen distended but no tenderness to palpation  Extremities: trace lower extremity  edema  Skin: No new rashes noted  Psych: Normal mood and affect    Assessment/Plan:  42 yoM w/ long pmhx notable for MI, CVA, HLD, T2DM and CKD admitted to the IMTS for observation of new onset atrial fibrillation with RVR. Since converted to SR with IV diltiazem and being rate controlled with carvedilol 6.25 mg BID.  Active Problems:   Dyslipidemia   DM II (diabetes mellitus, type II), controlled (HCC)   Atrial fibrillation with RVR (HCC)   Acute-[name]-chronic kidney injury (HCC)  New onset a.fib w/ RVR New oxygen requirement  Hypoxia  Dyspnea  Resolved sternal chest pain  Pt presented to ED with new onset a.fib w/ RVR that was converted with IV diltiazem. He has since been rate controlled with carvedilol w/ a target resting HR of <110 bpm. TTE [name] 7/25 showed a small pericardial effusion and LV grade I diastolic dysfunction with LVEF of 40-45% (down from 45-50% a year ago). CBC this a.m. showed improving leukocytosis. Pt also fevered at 100.6oF this am so bcx were sent. V/Q scan showed faint perfusion defect [name] the left which could not exclude a PE. We will therefore get a CTA, likely tomorrow, once kidney function shows  some improvement.  -f/u blood cx -CTA tomorrow  -f/u TTE -cont carvedilol 6.25mg  BID -anticoagulation switched from heparin to apixaban 5mg  QD -continue telemetry monitoring -daily weights with strict I/Os -continuous pulse ox -consider outpatient sleep study as recommended by cardiology  -Cards following, appreciate recs   HFrEF (EF 40-45%) TTE [name] 7/25 showed LV grade I diastolic dysfunction with LVEF of 40-45% (down from 45-50% a year ago). Will start an SGLT2 per cards recs.  -Start Dapagliflozin 10 mg QD  Acute [name] Chronic kidney disease  Painless hematuria:  Pt's creatine [name] an upward trend (1.49->1.74->1.85) and GFR decreased from 42->39. Urine is red in color and u/a showed hemoglobin, protein and RBCs. Change in urine color noted after heparin dose which is  concerning for heparin induced bleeding. We are also concerned for acute tubular necrosis 2/2 hypovolemia from recent diarrheal illness and/or hypoperfusion of kidneys. Will get FeNa for further workup and give fluids today. We are also concerned for a neoplastic etiology. Will assess using CT abd.  -f/u urine sediments  -calculate FeNa -f/u CT abdomen w/o co -IV NS  HTN: SBP WNL but some soft DBP ranging from 56-93 mmHg. At home patient is [name] carvedilol 3.125 mg bid and amlodipine 2.5 mg. Increased home carvedilol and holding amlodipine.  -cont carvedilol 6.25 mg bid  HLD: Well-controlled [name] home high dose statin. -cont home atorva  T2DM: Diet controlled diabetes. Glucose this am at 134. Last A1c 1 year ago, consider rechecking A1c in outpatient setting.   Gout: Holding home ppx in setting of AKI.   Diet: Heart healthy diet VTE PPX: Switch to apixaban 5 mg Code status: full   Prior to Admission Living Arrangement: Home Anticipated Discharge Location: Home Barriers to Discharge: Clinical improvement  Dispo: Anticipated discharge after PE workup and resolution of AKI.     LOS: 0 days   8/25 T, Medical Student 08/28/2020, 8:47 AM

## 2020-08-28 NOTE — Progress Notes (Signed)
Progress Note  Patient Name: Raymond EndsRobert Brown Date of Encounter: 08/28/2020  [facility] Cardiologist: None Verdis PrimeHenry Meliton Samad  Subjective   Son is in the room with patient.  I filled him in on findings to this point.  After reviewing telemetry, paroxysmal A. fib was identified between 6:30 AM and 6:40 AM today and was not recognized by the patient who is asleep.  This identifies a pattern of PAF.  The patient was initially in radiology for VQ scan.  He came back during my conversation with the son.  He denies dyspnea and chest pain.  He denies chills and fever.  Inpatient Medications    Scheduled Meds:  aspirin EC  81 mg Oral Daily   atorvastatin  80 mg Oral q1800   carvedilol  6.25 mg Oral BID   FLUoxetine  10 mg Oral Daily   multivitamin with minerals  1 tablet Oral Daily   Continuous Infusions:  heparin 1,400 Units/hr (08/27/20 2301)   PRN Meds: acetaminophen, ondansetron (ZOFRAN) IV   Vital Signs    Vitals:   08/28/20 0118 08/28/20 0400 08/28/20 0559 08/28/20 0811  BP: 100/64 109/66  110/71  Pulse: 82 83  81  Resp: 18 19  18   Temp: (!) 100.4 F (38 C) (!) 100.6 F (38.1 C)  98.8 F (37.1 C)  TempSrc: Oral Oral  Oral  SpO2: 92% 95%  93%  Weight:   100 kg   Height:        Intake/Output Summary (Last 24 hours) at 08/28/2020 0916 Last data filed at 08/28/2020 0655 Gross per 24 hour  Intake 549.69 ml  Output 400 ml  Net 149.69 ml   Last 3 Weights 08/28/2020 08/27/2020 04/09/2020  Weight (lbs) 220 lb 7.4 oz 208 lb 208 lb  Weight (kg) 100 kg 94.348 kg 94.348 kg      Telemetry    Paroxysmal atrial fibrillation, less than 10 minutes occurring between 630 and 6:40 AM- Personally Reviewed  ECG    A new tracing has not been repeated- Personally Reviewed  Physical Exam  Low-grade temperature noted after admission.  Has normalized this morning. GEN: No acute distress.   Neck: No JVD Cardiac: RRR, no murmurs, rubs, or gallops.  Respiratory: Clear to auscultation  bilaterally. GI: Tympanitic abdomen MS: No edema; No deformity. Neuro:  Nonfocal  Psych: Normal affect   Labs    High Sensitivity Troponin:   Recent Labs  Lab 08/27/20 0907 08/27/20 1107  TROPONINIHS 27* 23*      Chemistry Recent Labs  Lab 08/27/20 0907 08/28/20 0443  NA 137 137  K 3.7 3.4*  CL 106 106  CO2 23 23  GLUCOSE 136* 134*  BUN 17 21  CREATININE 1.74* 1.85*  CALCIUM 8.2* 8.2*  PROT 6.4*  --   ALBUMIN 2.6*  --   AST 28  --   ALT 22  --   ALKPHOS 107  --   BILITOT 1.1  --   GFRNONAA 42* 39*  ANIONGAP 8 8     Hematology Recent Labs  Lab 08/27/20 0907 08/28/20 0443  WBC 13.7* 11.6*  RBC 4.43 3.79*  HGB 14.2 12.1*  HCT 42.1 36.3*  MCV 95.0 95.8  MCH 32.1 31.9  MCHC 33.7 33.3  RDW 14.6 14.9  PLT 268 215    BNP Recent Labs  Lab 08/27/20 0907  BNP 236.8*     DDimer  Recent Labs  Lab 08/27/20 0907  DDIMER 1.82*     Radiology  DG Chest Port 1 View  Result Date: 08/27/2020 CLINICAL DATA:  Dyspnea. EXAM: PORTABLE CHEST 1 VIEW COMPARISON:  July 31, 2019. FINDINGS: The heart size and mediastinal contours are within normal limits. Both lungs are clear. The visualized skeletal structures are unremarkable. IMPRESSION: No active disease. Electronically Signed   By: Lupita Raider M.D.   On: 08/27/2020 09:34   ECHOCARDIOGRAM COMPLETE  Result Date: 08/27/2020    ECHOCARDIOGRAM REPORT   Patient Name:   [name] Date of Exam: 08/27/2020 Medical Rec #:  [id number]    Height:       70.0 in Accession #:    [id number]   Weight:       208.0 lb Date of Birth:  1950/05/07    BSA:          2.122 m Patient Age:    69 years     BP:           98/67 mmHg Patient Gender: M            HR:           82 bpm. Exam Location:  Inpatient Procedure: 2D Echo, Cardiac Doppler and Color Doppler Indications:    R07.9* Chest pain, unspecified  History:        Patient has prior history of Echocardiogram examinations, most                 recent 08/27/2019. Previous Myocardial  Infarction, Stroke; Risk                 Factors:Dyslipidemia and Diabetes.  Sonographer:    Elmarie Shiley Dance Referring Phys: [id number] RHONDA G BARRETT IMPRESSIONS  1. Left ventricular ejection fraction, by estimation, is 40 to 45%. The left ventricle has mildly decreased function. The left ventricle demonstrates regional wall motion abnormalities with apical hypokinesis. Left ventricular diastolic parameters are consistent with Grade I diastolic dysfunction (impaired relaxation).  2. Right ventricular systolic function is normal. The right ventricular size is normal. Tricuspid regurgitation signal is inadequate for assessing PA pressure.  3. Left atrial size was mildly dilated.  4. The mitral valve is normal in structure. No evidence of mitral valve regurgitation. No evidence of mitral stenosis.  5. The aortic valve is tricuspid. Aortic valve regurgitation is trivial. Mild aortic valve sclerosis is present, with no evidence of aortic valve stenosis.  6. The inferior vena cava is dilated in size with >50% respiratory variability, suggesting right atrial pressure of 8 mmHg.  7. A small pericardial effusion is present. There does appear to be respirophasic variation of the interventricular septum visually, and the mitral valve E inflow doppler velocity varies > 25% with inspiration. I do not think that there is pericardial tamponade, however there may be a component of pericardial constriction. FINDINGS  Left Ventricle: Left ventricular ejection fraction, by estimation, is 40 to 45%. The left ventricle has mildly decreased function. The left ventricle demonstrates regional wall motion abnormalities. The left ventricular internal cavity size was normal in size. There is no left ventricular hypertrophy. Left ventricular diastolic parameters are consistent with Grade I diastolic dysfunction (impaired relaxation). Right Ventricle: The right ventricular size is normal. No increase in right ventricular wall thickness. Right  ventricular systolic function is normal. Tricuspid regurgitation signal is inadequate for assessing PA pressure. Left Atrium: Left atrial size was mildly dilated. Right Atrium: Right atrial size was normal in size. Pericardium: A small pericardial effusion is present. Mitral Valve: The mitral valve is normal in structure.  Mild mitral annular calcification. No evidence of mitral valve regurgitation. No evidence of mitral valve stenosis. Tricuspid Valve: The tricuspid valve is normal in structure. Tricuspid valve regurgitation is not demonstrated. Aortic Valve: The aortic valve is tricuspid. Aortic valve regurgitation is trivial. Aortic regurgitation PHT measures 469 msec. Mild aortic valve sclerosis is present, with no evidence of aortic valve stenosis. Pulmonic Valve: The pulmonic valve was normal in structure. Pulmonic valve regurgitation is trivial. Aorta: The aortic root is normal in size and structure. Venous: The inferior vena cava is dilated in size with greater than 50% respiratory variability, suggesting right atrial pressure of 8 mmHg. IAS/Shunts: No atrial level shunt detected by color flow Doppler.  LEFT VENTRICLE PLAX 2D LVIDd:         4.90 cm  Diastology LVIDs:         4.10 cm  LV e' medial:    8.38 cm/s LV PW:         0.90 cm  LV E/e' medial:  8.5 LV IVS:        1.10 cm  LV e' lateral:   6.64 cm/s LVOT diam:     1.90 cm  LV E/e' lateral: 10.7 LV SV:         61 LV SV Index:   29 LVOT Area:     2.84 cm  RIGHT VENTRICLE             IVC RV Basal diam:  2.60 cm     IVC diam: 2.50 cm RV S prime:     12.60 cm/s TAPSE (M-mode): 1.8 cm LEFT ATRIUM             Index       RIGHT ATRIUM           Index LA diam:        4.30 cm 2.03 cm/m  RA Area:     11.30 cm LA Vol (A2C):   81.6 ml 38.45 ml/m RA Volume:   20.50 ml  9.66 ml/m LA Vol (A4C):   49.8 ml 23.46 ml/m LA Biplane Vol: 66.6 ml 31.38 ml/m  AORTIC VALVE LVOT Vmax:   101.00 cm/s LVOT Vmean:  74.400 cm/s LVOT VTI:    0.214 m AI PHT:      469 msec  AORTA  Ao Root diam: 3.40 cm Ao Asc diam:  3.70 cm MITRAL VALVE MV Area (PHT): 3.91 cm    SHUNTS MV Decel Time: 194 msec    Systemic VTI:  0.21 m MV E velocity: 71.30 cm/s  Systemic Diam: 1.90 cm MV A velocity: 81.50 cm/s MV E/A ratio:  0.87 Marca Ancona MD Electronically signed by Marca Ancona MD Signature Date/Time: 08/27/2020/4:56:49 PM    Final     Cardiac Studies   Echocardiogram demonstrates LVEF 40 to 45% with regional wall motion abnormality.  Patient Profile     70 y.o. 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  Assessment & Plan    Paroxysmal atrial fibrillation: Had a symptomatic episode last evening.  The patient will need a sleep study.  This can be done as an outpatient. CKD stage IIIb: Has progressed/creatinine slightly higher. Low-grade temperature elevation: Low-grade fever of uncertain cause. Chronic combined systolic and diastolic heart failure: Heart failure  with midrange LVEF 237.  No obvious volume overload.  We will start Farxiga 10 mg/day.  Continue carvedilol.  ARB/Arni therapy and MRA therapy will not be initiated until kidney function stabilizes. Elevated troponin I: Presumed type II myocardial injury related to either epicardial or microvascular coronary disease.  Given renal dysfunction and absence of angina when not in atrial for, the most reasonable ischemic approach would be outpatient myocardial perfusion imaging. Elevated CHADS VASC Score >2: Eliquis has been started and will be an enduring therapy because of the patient's recurrent AF.      For questions or updates, please contact [facility] Please consult [email or web address] for contact info under        Signed, Lesleigh Noe, MD  08/28/2020, 9:16 AM

## 2020-08-29 ENCOUNTER — Observation Stay (HOSPITAL_COMMUNITY): Payer: Medicare HMO

## 2020-08-29 DIAGNOSIS — Z20822 Contact with and (suspected) exposure to covid-19: Secondary | ICD-10-CM | POA: Diagnosis present

## 2020-08-29 DIAGNOSIS — I5042 Chronic combined systolic (congestive) and diastolic (congestive) heart failure: Secondary | ICD-10-CM | POA: Diagnosis present

## 2020-08-29 DIAGNOSIS — I255 Ischemic cardiomyopathy: Secondary | ICD-10-CM | POA: Diagnosis present

## 2020-08-29 DIAGNOSIS — N170 Acute kidney failure with tubular necrosis: Secondary | ICD-10-CM | POA: Diagnosis not present

## 2020-08-29 DIAGNOSIS — E669 Obesity, unspecified: Secondary | ICD-10-CM | POA: Diagnosis present

## 2020-08-29 DIAGNOSIS — D72828 Other elevated white blood cell count: Secondary | ICD-10-CM | POA: Diagnosis present

## 2020-08-29 DIAGNOSIS — Z79899 Other long term (current) drug therapy: Secondary | ICD-10-CM | POA: Diagnosis not present

## 2020-08-29 DIAGNOSIS — R079 Chest pain, unspecified: Secondary | ICD-10-CM | POA: Diagnosis not present

## 2020-08-29 DIAGNOSIS — R0600 Dyspnea, unspecified: Secondary | ICD-10-CM | POA: Diagnosis not present

## 2020-08-29 DIAGNOSIS — J9601 Acute respiratory failure with hypoxia: Secondary | ICD-10-CM | POA: Diagnosis present

## 2020-08-29 DIAGNOSIS — I300 Acute nonspecific idiopathic pericarditis: Secondary | ICD-10-CM | POA: Diagnosis not present

## 2020-08-29 DIAGNOSIS — I301 Infective pericarditis: Secondary | ICD-10-CM | POA: Diagnosis present

## 2020-08-29 DIAGNOSIS — I480 Paroxysmal atrial fibrillation: Secondary | ICD-10-CM | POA: Diagnosis present

## 2020-08-29 DIAGNOSIS — N183 Chronic kidney disease, stage 3 unspecified: Secondary | ICD-10-CM | POA: Diagnosis not present

## 2020-08-29 DIAGNOSIS — I4891 Unspecified atrial fibrillation: Secondary | ICD-10-CM | POA: Diagnosis not present

## 2020-08-29 DIAGNOSIS — E860 Dehydration: Secondary | ICD-10-CM | POA: Diagnosis present

## 2020-08-29 DIAGNOSIS — Z809 Family history of malignant neoplasm, unspecified: Secondary | ICD-10-CM | POA: Diagnosis not present

## 2020-08-29 DIAGNOSIS — E1122 Type 2 diabetes mellitus with diabetic chronic kidney disease: Secondary | ICD-10-CM | POA: Diagnosis present

## 2020-08-29 DIAGNOSIS — Z7982 Long term (current) use of aspirin: Secondary | ICD-10-CM | POA: Diagnosis not present

## 2020-08-29 DIAGNOSIS — R0602 Shortness of breath: Secondary | ICD-10-CM | POA: Diagnosis present

## 2020-08-29 DIAGNOSIS — N1831 Chronic kidney disease, stage 3a: Secondary | ICD-10-CM | POA: Diagnosis present

## 2020-08-29 DIAGNOSIS — Z6830 Body mass index (BMI) 30.0-30.9, adult: Secondary | ICD-10-CM | POA: Diagnosis not present

## 2020-08-29 DIAGNOSIS — N179 Acute kidney failure, unspecified: Secondary | ICD-10-CM | POA: Diagnosis present

## 2020-08-29 DIAGNOSIS — Z955 Presence of coronary angioplasty implant and graft: Secondary | ICD-10-CM | POA: Diagnosis not present

## 2020-08-29 DIAGNOSIS — I313 Pericardial effusion (noninflammatory): Secondary | ICD-10-CM | POA: Diagnosis not present

## 2020-08-29 DIAGNOSIS — Z8249 Family history of ischemic heart disease and other diseases of the circulatory system: Secondary | ICD-10-CM | POA: Diagnosis not present

## 2020-08-29 DIAGNOSIS — E785 Hyperlipidemia, unspecified: Secondary | ICD-10-CM | POA: Diagnosis present

## 2020-08-29 DIAGNOSIS — I252 Old myocardial infarction: Secondary | ICD-10-CM | POA: Diagnosis not present

## 2020-08-29 DIAGNOSIS — I309 Acute pericarditis, unspecified: Secondary | ICD-10-CM | POA: Diagnosis present

## 2020-08-29 DIAGNOSIS — Z8673 Personal history of transient ischemic attack (TIA), and cerebral infarction without residual deficits: Secondary | ICD-10-CM | POA: Diagnosis not present

## 2020-08-29 DIAGNOSIS — I130 Hypertensive heart and chronic kidney disease with heart failure and stage 1 through stage 4 chronic kidney disease, or unspecified chronic kidney disease: Secondary | ICD-10-CM | POA: Diagnosis present

## 2020-08-29 DIAGNOSIS — R0789 Other chest pain: Secondary | ICD-10-CM | POA: Diagnosis not present

## 2020-08-29 DIAGNOSIS — M109 Gout, unspecified: Secondary | ICD-10-CM | POA: Diagnosis present

## 2020-08-29 LAB — CBC
HCT: 34 % — ABNORMAL LOW (ref 39.0–52.0)
Hemoglobin: 11.6 g/dL — ABNORMAL LOW (ref 13.0–17.0)
MCH: 32.7 pg (ref 26.0–34.0)
MCHC: 34.1 g/dL (ref 30.0–36.0)
MCV: 95.8 fL (ref 80.0–100.0)
Platelets: 217 10*3/uL (ref 150–400)
RBC: 3.55 MIL/uL — ABNORMAL LOW (ref 4.22–5.81)
RDW: 14.6 % (ref 11.5–15.5)
WBC: 10.9 10*3/uL — ABNORMAL HIGH (ref 4.0–10.5)
nRBC: 0 % (ref 0.0–0.2)

## 2020-08-29 LAB — C-REACTIVE PROTEIN
CRP: 23.7 mg/dL — ABNORMAL HIGH (ref <1.0)
CRP: 26.5 mg/dL — ABNORMAL HIGH (ref <1.0)

## 2020-08-29 LAB — BASIC METABOLIC PANEL
Anion gap: 9 (ref 5–15)
BUN: 24 mg/dL — ABNORMAL HIGH (ref 8–23)
CO2: 19 mmol/L — ABNORMAL LOW (ref 22–32)
Calcium: 8.3 mg/dL — ABNORMAL LOW (ref 8.9–10.3)
Chloride: 107 mmol/L (ref 98–111)
Creatinine, Ser: 1.86 mg/dL — ABNORMAL HIGH (ref 0.61–1.24)
GFR, Estimated: 39 mL/min — ABNORMAL LOW (ref >60)
Glucose, Bld: 117 mg/dL — ABNORMAL HIGH (ref 70–99)
Potassium: 3.8 mmol/L (ref 3.5–5.1)
Sodium: 135 mmol/L (ref 135–145)

## 2020-08-29 LAB — MICROALBUMIN / CREATININE URINE RATIO
Creatinine, Urine: 227.4 mg/dL
Microalb Creat Ratio: 18 mg/g creat (ref 0–29)
Microalb, Ur: 39.9 ug/mL — ABNORMAL HIGH

## 2020-08-29 LAB — SEDIMENTATION RATE: Sed Rate: 52 mm/hr — ABNORMAL HIGH (ref 0–16)

## 2020-08-29 LAB — MAGNESIUM: Magnesium: 1.8 mg/dL (ref 1.7–2.4)

## 2020-08-29 IMAGING — DX DG CHEST 1V
1 series · 1 of 1 positions shown · non-contrast
Comparison: [DATE]

CLINICAL DATA: Tachycardia and shortness of breath.

EXAM:
CHEST  1 VIEW

[chest]
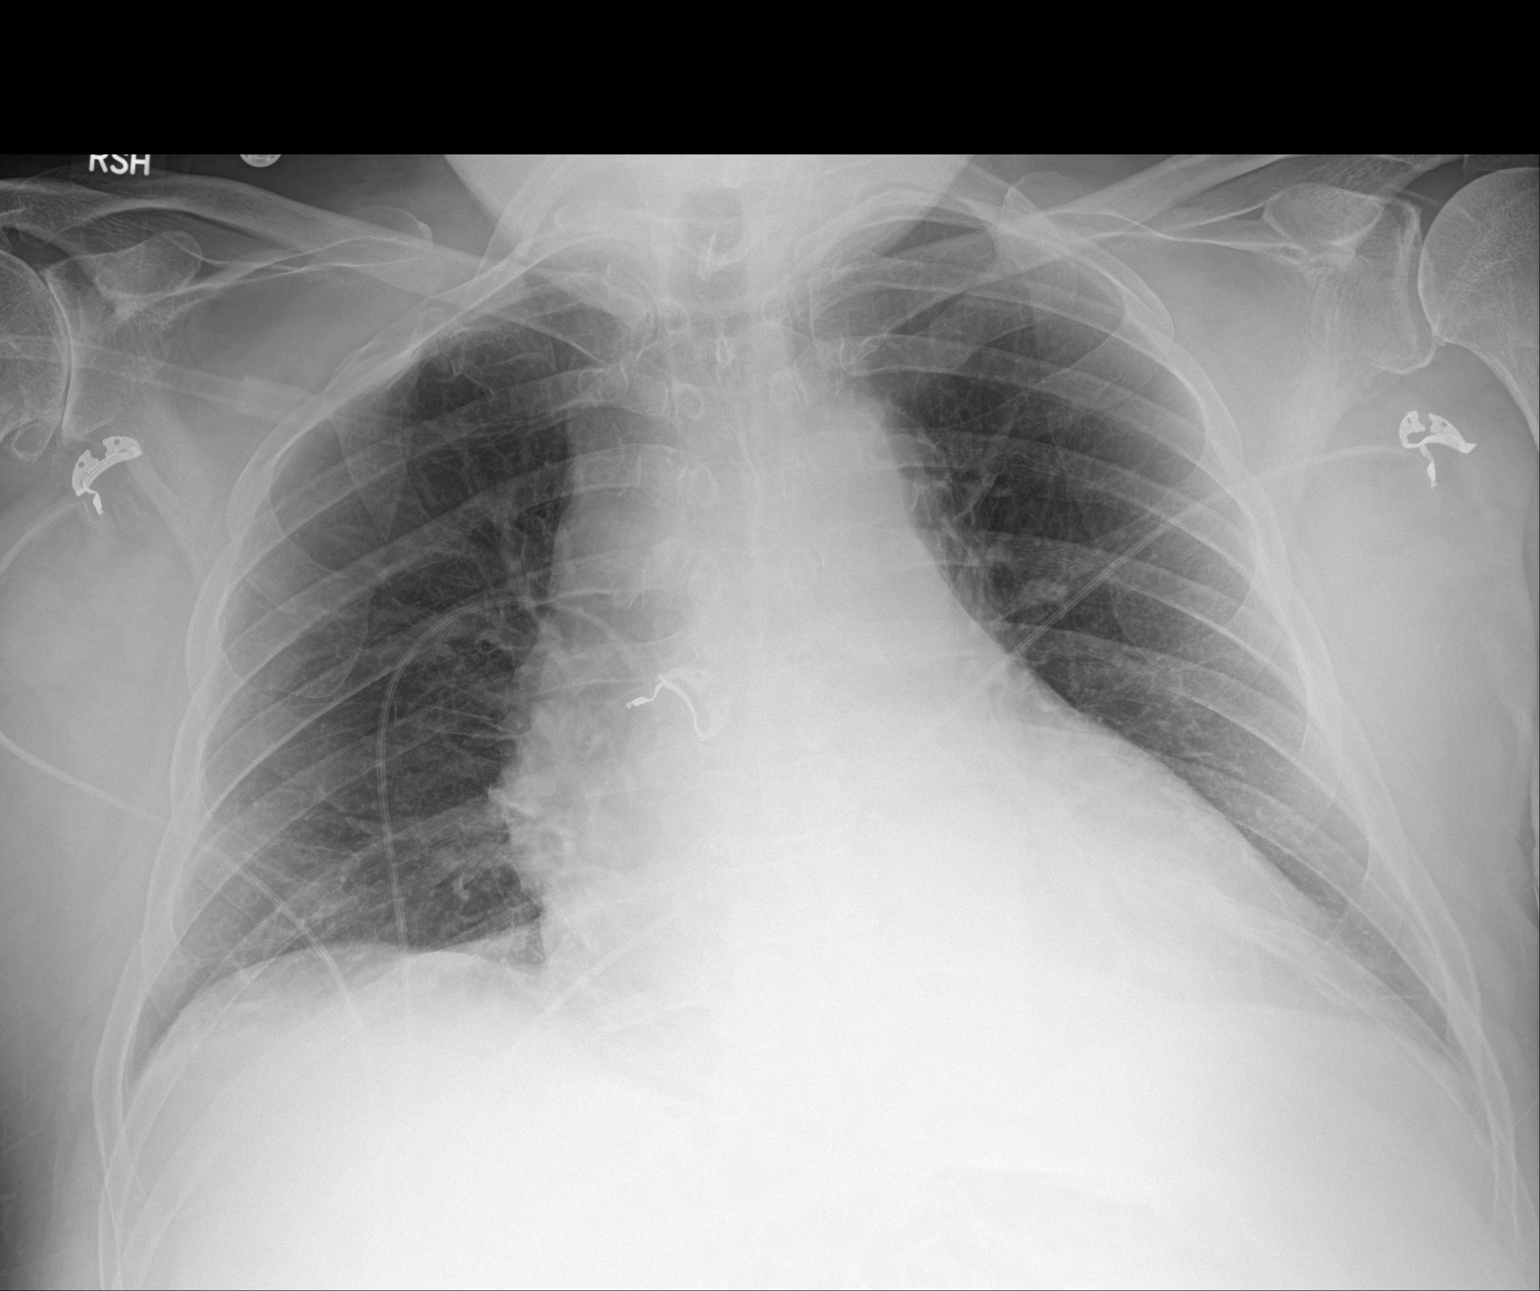

[1 of 1 positions shown; findings below may reference images not displayed]

FINDINGS: The heart is upper limits of normal in size given the AP projection
and portable technique.

No acute pulmonary findings.

The bony structures are intact.
IMPRESSION: No acute cardiopulmonary findings.

## 2020-08-29 MED ORDER — SENNA 8.6 MG PO TABS
1.0000 | ORAL_TABLET | Freq: Every day | ORAL | Status: DC | PRN
  Administered 2020-09-01: 8.6 mg via ORAL
  Filled 2020-08-29: qty 1

## 2020-08-29 MED ORDER — POLYETHYLENE GLYCOL 3350 17 G PO PACK: 17.0000 g | PACK | Freq: Every day | ORAL | Status: DC | PRN

## 2020-08-29 MED ORDER — POLYETHYLENE GLYCOL 3350 17 G PO PACK
17.0000 g | PACK | Freq: Every day | ORAL | Status: DC | PRN
  Filled 2020-08-29: qty 1

## 2020-08-29 MED ORDER — IPRATROPIUM-ALBUTEROL 0.5-2.5 (3) MG/3ML IN SOLN
3.0000 mL | RESPIRATORY_TRACT | Status: DC | PRN
  Administered 2020-08-29: 3 mL via RESPIRATORY_TRACT
  Filled 2020-08-29: qty 3

## 2020-08-29 MED ORDER — CARVEDILOL 12.5 MG PO TABS
12.5000 mg | ORAL_TABLET | Freq: Two times a day (BID) | ORAL | Status: DC
  Administered 2020-08-29 – 2020-09-02 (×8): 12.5 mg via ORAL
  Filled 2020-08-29 (×9): qty 1

## 2020-08-29 MED ORDER — POTASSIUM CHLORIDE CRYS ER 20 MEQ PO TBCR
40.0000 meq | EXTENDED_RELEASE_TABLET | Freq: Once | ORAL | Status: AC
  Administered 2020-08-29: 40 meq via ORAL
  Filled 2020-08-29: qty 2

## 2020-08-29 MED ORDER — MAGNESIUM SULFATE 2 GM/50ML IV SOLN
2.0000 g | Freq: Once | INTRAVENOUS | Status: AC
  Administered 2020-08-29: 2 g via INTRAVENOUS
  Filled 2020-08-29: qty 50

## 2020-08-29 MED ORDER — SENNA 8.6 MG PO TABS: 1.0000 | ORAL_TABLET | Freq: Every day | ORAL | Status: DC | PRN

## 2020-08-29 MED ORDER — SODIUM CHLORIDE 0.9 % IV SOLN: INTRAVENOUS | Status: AC

## 2020-08-29 MED ORDER — SODIUM CHLORIDE 0.9 % IV SOLN
12.5000 mg | INTRAVENOUS | Status: DC | PRN
  Filled 2020-08-29: qty 0.5

## 2020-08-29 MED ORDER — SODIUM CHLORIDE 0.9 % IV BOLUS
500.0000 mL | Freq: Once | INTRAVENOUS | Status: AC
  Administered 2020-08-29: 500 mL via INTRAVENOUS

## 2020-08-29 MED ORDER — COLCHICINE 0.6 MG PO TABS
0.6000 mg | ORAL_TABLET | Freq: Two times a day (BID) | ORAL | Status: DC
  Administered 2020-08-29 – 2020-09-04 (×12): 0.6 mg via ORAL
  Filled 2020-08-29 (×14): qty 1

## 2020-08-29 MED ORDER — CARVEDILOL 12.5 MG PO TABS: 12.5000 mg | ORAL_TABLET | Freq: Two times a day (BID) | ORAL | Status: DC

## 2020-08-29 NOTE — Progress Notes (Signed)
S:Paged for A. fib with RVR.  Patient examined at bedside and stable. Appears comfortable on 3 L nasal canula.  Audible heart sounds and no distention of neck veins.  O:Review recent imaging : Small pericardial effusion on TTE, noted to be mild pericardial effusion on CT results which returned this afternoon. Telemetry reviewed and patient is in atrial fibrillation, rate increased around 3 AM to 130s to 140s.  Currently heart rate is 128.  Blood pressure is soft , SBP 90 to 102.    A/P: Patient has mild pericardial effusion, but exam does not suggest tamponade causing low blood pressures. Currently on rate control with Coreg 6.25 twice daily. Given low BP, will not treat urgently. Patient has received 4 doses of Coreg 6.25, will increase dose due at 10 AM to 12.5 for rate control. Check morning BMP and Mg. K>4, Mg>2.  Thurmon Fair, MD PGY3 Internal Medicine [phone]

## 2020-08-29 NOTE — Progress Notes (Signed)
RN spoke to Rehman,DO and notified her that the patient was nauseous and could not swallow his evening coreg and Lipitor. After Zofran administration pt still is throwing up Rehman,DO stated she would switch him to something IV for now and something else for nausea.

## 2020-08-29 NOTE — Progress Notes (Signed)
S:  Patient seen this afternoon after episode of emesis. Wife at bedside. Reports episode of vomiting after receiving his duonebs, after which he started feeling phlegm in the back of his throat. He denies nausea, or abdominal pain. Emesis is mixed with his soda and has phlegm.   O: Vitals with BMI 08/29/2020 08/29/2020 08/29/2020  Height - - -  Weight - - -  BMI - - -  Systolic 130 112 876  Diastolic 83 68 65  Pulse 76 79 77   Physical exam: General: tired appearing male laying in bed  CV: irregular rate with no murmurs Lungs: Mild wheezing auscultated bilaterally  Abd: Distended abdomen that is non tender to light and deep palpation in all four quadrants.   A: 11 yoM with long pmhx admitted for a.fib and has since developed an AKI. Later found to have a small pericardial effusion on TTE and CT abd and elevated inflammatory markers concerning for pericarditis.    P: Emesis: Likely phlegm induced vomiting while eating. No nausea, abd pain and physical exam with out abdominal findings. Pt treated with ondansetron. Will continue to monitor.   Pericardial effusion and elevated inflammatory markers concerning for pericarditis: Initially presented with chest pain and had some fevers inpatient. Small circumferential pericardial effusion on imaging along with elevated inflammatory markers concerning for pericarditis. Started on colchicine 0.6 mg daily.   Atrial fib: Rate controlled at goal HR since increased dose of carvedilol this am.

## 2020-08-29 NOTE — Progress Notes (Addendum)
Progress Note  Patient Name: [name]: 08/29/2020  [facility] Cardi[facility]: None [name], [name]  Subjective   Feeling hot earlier today and was documented to have temperature greater than >101 F rectally. Says he felt hot last night but did not have rigors or chills.  Denies chest pain.  Inpatient Medications    Scheduled Meds:  apixaban  5 mg Oral BID   aspirin EC  81 mg Oral Daily   atorvastatin  80 mg Oral q1800   carvedilol  12.5 mg Oral BID WC   dapagliflozin propanediol  10 mg Oral Daily   FLUoxetine  10 mg Oral Daily   multivitamin with minerals  1 tablet Oral Daily   Continuous Infusions:  sodium chloride     Followed by   sodium chloride     magnesium sulfate bolus IVPB 2 g (08/29/20 1202)   PRN Meds: acetaminophen, ipratropium-albuterol, ondansetron (ZOFRAN) IV   Vital Signs    Vitals:   08/29/20 0627 08/29/20 0719 08/29/20 0835 08/29/20 1119  BP:  101/63  101/65  Pulse:  (!) 137  77  Resp:  20  16  Temp:  98.8 F (37.1 C) 97.7 F (36.5 C) 98.5 F (36.9 C)  TempSrc:  Oral Oral Oral  SpO2:  98%  99%  Weight: 101.4 kg     Height:        Intake/Output Summary (Last 24 hours) at 08/29/2020 1345 Last data filed at 08/29/2020 0648 Gross per 24 hour  Intake 1391.53 ml  Output 900 ml  Net 491.53 ml   Last 3 Weights 08/29/2020 08/28/2020 08/27/2020  Weight (lbs) 223 lb 8.7 oz 220 lb 7.4 oz 208 lb  Weight (kg) 101.4 kg 100 kg 94.348 kg      Telemetry    Intermittent episodes of atrial fibrillation.- Personally Reviewed  ECG    ECG this a.m. demonstrated right bundle with atrial fibs and poor rate control.- Personally Reviewed  Physical Exam  Obese, sleeping, and in no acute distress. GEN: Somewhat apathetic/bland affect. Neck: No JVD Cardiac: RRR, no murmurs, pericardial rub, or gallop.  Respiratory: Clear to auscultation bilaterally. GI: Soft, nontender, non-distended  MS: No edema; No deformity. Neuro:   Nonfocal  Psych: Normal affect   Labs    High Sensitivity Troponin:   Recent Labs  Lab 08/27/20 0907 08/27/20 1107  TROPONINIHS 27* 23*      Chemistry Recent Labs  Lab 08/27/20 0907 08/28/20 0443 08/29/20 0603  NA 137 137 135  K 3.7 3.4* 3.8  CL 106 106 107  CO2 23 23 19*  GLUCOSE 136* 134* 117*  BUN 17 21 24*  CREATININE 1.74* 1.85* 1.86*  CALCIUM 8.2* 8.2* 8.3*  PROT 6.4*  --   --   ALBUMIN 2.6*  --   --   AST 28  --   --   ALT 22  --   --   ALKPHOS 107  --   --   BILITOT 1.1  --   --   GFRNONAA 42* 39* 39*  ANIONGAP 8 8 9      Hematology Recent Labs  Lab 08/27/20 0907 08/28/20 0443 08/29/20 0400  WBC 13.7* 11.6* 10.9*  RBC 4.43 3.79* 3.55*  HGB 14.2 12.1* 11.6*  HCT 42.1 36.3* 34.0*  MCV 95.0 95.8 95.8  MCH 32.1 31.9 32.7  MCHC 33.7 33.3 34.1  RDW 14.6 14.9 14.6  PLT 268 215 217    CRP: 23.7  BNP  Recent Labs  Lab 08/27/20 0907  BNP 236.8*     DDimer  Recent Labs  Lab 08/27/20 0907  DDIMER 1.82*     Radiology    CT ABDOMEN PELVIS WO CONTRAST  Result Date: 08/28/2020 CLINICAL DATA:  Gross hematuria. EXAM: CT ABDOMEN AND PELVIS WITHOUT CONTRAST TECHNIQUE: Multidetector CT imaging of the abdomen and pelvis was performed following the standard protocol without IV contrast. COMPARISON:  None. FINDINGS: Lower chest: Mild bilateral posterior basilar subsegmental atelectasis is noted. Mild pericardial effusion is noted. Hepatobiliary: Cholelithiasis is noted. No biliary dilatation is noted. The liver is unremarkable. Pancreas: Unremarkable. No pancreatic ductal dilatation or surrounding inflammatory changes. Spleen: Normal in size without focal abnormality. Adrenals/Urinary Tract: Adrenal glands appear normal. Right renal cyst is noted. Nonobstructive bilateral nephrolithiasis is noted. Right renal cyst is noted. No hydronephrosis or renal obstruction is noted. Urinary bladder is unremarkable. Stomach/Bowel: Stomach is within normal limits. Appendix  appears normal. No evidence of bowel wall thickening, distention, or inflammatory changes. Vascular/Lymphatic: Aortic atherosclerosis. No enlarged abdominal or pelvic lymph nodes. Reproductive: Prostate is unremarkable. Other: Moderate size fat containing periumbilical hernia is noted. No ascites is noted. Musculoskeletal: No acute or significant osseous findings. IMPRESSION: Bilateral nonobstructive nephrolithiasis. No hydronephrosis or renal obstruction is noted. Cholelithiasis. Moderate size fat containing periumbilical hernia. Mild pericardial effusion. Aortic Atherosclerosis (ICD10-I70.0). Electronically Signed   By: Lupita Raider M.D.   On: 08/28/2020 18:38   DG Chest 1 View  Result Date: 08/29/2020 CLINICAL DATA:  Tachycardia and shortness of breath. EXAM: CHEST  1 VIEW COMPARISON:  08/27/2020 FINDINGS: The heart is upper limits of normal in size given the AP projection and portable technique. No acute pulmonary findings. The bony structures are intact. IMPRESSION: No acute cardiopulmonary findings. Electronically Signed   By: Rudie Meyer M.D.   On: 08/29/2020 09:49   NM Pulmonary Perfusion  Result Date: 08/28/2020 CLINICAL DATA:  Suspected P. Positive D-dimer. EXAM: NUCLEAR MEDICINE PERFUSION LUNG SCAN TECHNIQUE: Perfusion images were obtained in multiple projections after intravenous injection of radiopharmaceutical. Ventilation scans intentionally deferred if perfusion scan and chest x-ray adequate for interpretation during COVID 19 epidemic. RADIOPHARMACEUTICALS:  4.4 mCi Tc-34m MAA IV COMPARISON:  Chest x-ray 08/27/2020. FINDINGS: Findings again noted consistent with cardiomegaly. A faint perfusion defect on the left cannot be excluded. Pulmonary embolus cannot be excluded. IMPRESSION: Cardiomegaly. A faint perfusion defect on the left cannot be excluded. Pulmonary embolus cannot be excluded. Electronically Signed   By: Maisie Fus  Register   On: 08/28/2020 10:00   ECHOCARDIOGRAM  COMPLETE  Result Date: 08/27/2020    ECHOCARDIOGRAM REPORT   Patient Name:   [name] Date of Exam: 08/27/2020 Medical Rec #:  [id number]    Height:       70.0 in Accession #:    [id number]   Weight:       208.0 lb Date of Birth:  1951-01-25    BSA:          2.122 m Patient Age:    69 years     BP:           98/67 mmHg Patient Gender: M            HR:           82 bpm. Exam Location:  Inpatient Procedure: 2D Echo, Cardiac Doppler and Color Doppler Indications:    R07.9* Chest pain, unspecified  History:        Patient has prior history of Echocardiogram examinations, most  recent 08/27/2019. Previous Myocardial Infarction, Stroke; Risk                 Factors:Dyslipidemia and Diabetes.  Sonographer:    Elmarie Shiley Dance Referring Phys: [id number] RHONDA G BARRETT IMPRESSIONS  1. Left ventricular ejection fraction, by estimation, is 40 to 45%. The left ventricle has mildly decreased function. The left ventricle demonstrates regional wall motion abnormalities with apical hypokinesis. Left ventricular diastolic parameters are consistent with Grade I diastolic dysfunction (impaired relaxation).  2. Right ventricular systolic function is normal. The right ventricular size is normal. Tricuspid regurgitation signal is inadequate for assessing PA pressure.  3. Left atrial size was mildly dilated.  4. The mitral valve is normal in structure. No evidence of mitral valve regurgitation. No evidence of mitral stenosis.  5. The aortic valve is tricuspid. Aortic valve regurgitation is trivial. Mild aortic valve sclerosis is present, with no evidence of aortic valve stenosis.  6. The inferior vena cava is dilated in size with >50% respiratory variability, suggesting right atrial pressure of 8 mmHg.  7. A small pericardial effusion is present. There does appear to be respirophasic variation of the interventricular septum visually, and the mitral valve E inflow doppler velocity varies > 25% with inspiration. I do not think  that there is pericardial tamponade, however there may be a component of pericardial constriction. FINDINGS  Left Ventricle: Left ventricular ejection fraction, by estimation, is 40 to 45%. The left ventricle has mildly decreased function. The left ventricle demonstrates regional wall motion abnormalities. The left ventricular internal cavity size was normal in size. There is no left ventricular hypertrophy. Left ventricular diastolic parameters are consistent with Grade I diastolic dysfunction (impaired relaxation). Right Ventricle: The right ventricular size is normal. No increase in right ventricular wall thickness. Right ventricular systolic function is normal. Tricuspid regurgitation signal is inadequate for assessing PA pressure. Left Atrium: Left atrial size was mildly dilated. Right Atrium: Right atrial size was normal in size. Pericardium: A small pericardial effusion is present. Mitral Valve: The mitral valve is normal in structure. Mild mitral annular calcification. No evidence of mitral valve regurgitation. No evidence of mitral valve stenosis. Tricuspid Valve: The tricuspid valve is normal in structure. Tricuspid valve regurgitation is not demonstrated. Aortic Valve: The aortic valve is tricuspid. Aortic valve regurgitation is trivial. Aortic regurgitation PHT measures 469 msec. Mild aortic valve sclerosis is present, with no evidence of aortic valve stenosis. Pulmonic Valve: The pulmonic valve was normal in structure. Pulmonic valve regurgitation is trivial. Aorta: The aortic root is normal in size and structure. Venous: The inferior vena cava is dilated in size with greater than 50% respiratory variability, suggesting right atrial pressure of 8 mmHg. IAS/Shunts: No atrial level shunt detected by color flow Doppler.  LEFT VENTRICLE PLAX 2D LVIDd:         4.90 cm  Diastology LVIDs:         4.10 cm  LV e' medial:    8.38 cm/s LV PW:         0.90 cm  LV E/e' medial:  8.5 LV IVS:        1.10 cm  LV e'  lateral:   6.64 cm/s LVOT diam:     1.90 cm  LV E/e' lateral: 10.7 LV SV:         61 LV SV Index:   29 LVOT Area:     2.84 cm  RIGHT VENTRICLE  IVC RV Basal diam:  2.60 cm     IVC diam: 2.50 cm RV S prime:     12.60 cm/s TAPSE (M-mode): 1.8 cm LEFT ATRIUM             Index       RIGHT ATRIUM           Index LA diam:        4.30 cm 2.03 cm/m  RA Area:     11.30 cm LA Vol (A2C):   81.6 ml 38.45 ml/m RA Volume:   20.50 ml  9.66 ml/m LA Vol (A4C):   49.8 ml 23.46 ml/m LA Biplane Vol: 66.6 ml 31.38 ml/m  AORTIC VALVE LVOT Vmax:   101.00 cm/s LVOT Vmean:  74.400 cm/s LVOT VTI:    0.214 m AI PHT:      469 msec  AORTA Ao Root diam: 3.40 cm Ao Asc diam:  3.70 cm MITRAL VALVE MV Area (PHT): 3.91 cm    SHUNTS MV Decel Time: 194 msec    Systemic VTI:  0.21 m MV E velocity: 71.30 cm/s  Systemic Diam: 1.90 cm MV A velocity: 81.50 cm/s MV E/A ratio:  0.87 Marca Ancona MD Electronically signed by Marca Ancona MD Signature Date/Time: 08/27/2020/4:56:49 PM    Final     Cardiac Studies   Echocardiogram: Small pericardial effusion; mid range/mildly reduced EF 45%. Indeterminate VQ scan for PE Cholelithiasis and nephrolithiasis on abdominal CT  Patient Profile     70 y.o. 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  Assessment & Plan    Paroxysmal atrial fibrillation: Does have pericardial effusion and may have pericarditis.  Inflammatory markers will be helpful.  Episodes of A. fib have been relatively short and therefore have not started antiarrhythmic therapy.  He has an elevated stroke risk and is on anticoagulation. Small circumferential pericardial effusion: Uncertain significance.  Inflammatory markers will help.  Hs-CRP and sed rate should be drawn.  If elevated,  will recommend nonsteroidal anti-inflammatory therapy or course of steroids.  His overall clinical condition has reasons to avoid each.  Nonsteroidals in the setting of CKD are probably not a good idea.  Can likely use colchicine, renally adjusted and consider a short course of steroids if we are sure he does not have a bacterial infection. Febrile illness: Not yet characterized.  Could be related to pericarditis or some other illness.  Possibly needs blood cultures. Coronary artery disease: Prior history of coronary stent.  No active ongoing chest pain or evidence of acute MI on this admission.  Minimal increase in troponin I likely related to demand ischemia from A. fib with RVR Elevated Chads vascular score: The patient is on Eliquis. CKD 3B: Stable     For questions or updates, please contact [facility] Please consult [email or web address] for contact info under        Signed, Lesleigh Noe, MD  08/29/2020, 1:45 PM

## 2020-08-29 NOTE — Progress Notes (Signed)
Pt wife called out and stated that the patient feels hot and that he threw up phlegm and is having a hard time even swallowing his drink he throws it back up. RN took vitals and paged MD team they stated they would come and evaluate the patient.

## 2020-08-29 NOTE — Progress Notes (Signed)
Pt complaining of being hot. Pt sweating a lot and wetting his clothes and bed pads. Oral temp taken and it showed to be WDL. Retal temp 101.3. Provider called and made aware and stated will review patient's status with team. Provider also made nurse aware will put in order to notify MD when patient has a HR greater than 150bpm.

## 2020-08-29 NOTE — Progress Notes (Signed)
Subjective:  ON Mr.Schryver was noted to be in afib with RVR (around 3am) and was given a higher dose of his carvedilol around 8:30 am. He also fevered at 101.5oF and was reported to be diaphoretic. Was afebrile during morning rounds.  Patient eating breakfast with son at bedside this am during rounds. Wearing 3L [place] and reports no SOB or chest pain, however he did note wheezing which is new for him. He continues to have red colored urine in foley bag this am.   Objective:  Physical exam:  General: Well appearing male sitting up in bed eating breakfast CV: irregular rhythm but no murmurs, rubs or gallops  Pulm: Wheezing heard bilaterally, No wheezing on exam in afternoon Abdomen: distended but nontender to palpation Extremities: trace edema Skin: No new changes Psych: normal affect and mood   Vital signs in last 24 hours: Vitals:   08/29/20 0619 08/29/20 0627 08/29/20 0719 08/29/20 0835  BP: 98/73  101/63   Pulse: (!) 134  (!) 137   Resp: 20  20   Temp: 99 F (37.2 C)  98.8 F (37.1 C) 97.7 F (36.5 C)  TempSrc: Oral  Oral Oral  SpO2: 97%  98%   Weight:  101.4 kg    Height:       Weight change: 7.052 kg  Intake/Output Summary (Last 24 hours) at 08/29/2020 1019 Last data filed at 08/29/2020 0648 Gross per 24 hour  Intake 1391.53 ml  Output 1000 ml  Net 391.53 ml   CBC Latest Ref Rng & Units 08/29/2020 08/28/2020 08/27/2020  WBC 4.0 - 10.5 K/uL 10.9(H) 11.6(H) 13.7(H)  Hemoglobin 13.0 - 17.0 g/dL 11.6(L) 12.1(L) 14.2  Hematocrit 39.0 - 52.0 % 34.0(L) 36.3(L) 42.1  Platelets 150 - 400 K/uL 217 215 268   BMP Latest Ref Rng & Units 08/29/2020 08/28/2020 08/27/2020  Glucose 70 - 99 mg/dL 117(H) 134(H) 136(H)  BUN 8 - 23 mg/dL 24(H) 21 17  Creatinine 0.61 - 1.24 mg/dL 1.86(H) 1.85(H) 1.74(H)  Sodium 135 - 145 mmol/L 135 137 137  Potassium 3.5 - 5.1 mmol/L 3.8 3.4(L) 3.7  Chloride 98 - 111 mmol/L 107 106 106  CO2 22 - 32 mmol/L 19(L) 23 23  Calcium 8.9 - 10.3 mg/dL 8.3(L) 8.2(L)  8.2(L)   Noncon CT Abd (7/26): Bilateral nonobstructive nephrolithiasis. No hydronephrosis or renal obstruction is noted. 2. Cholelithiasis. 3. Moderate size fat containing periumbilical hernia. 4. Mild pericardial effusion. 5. Aortic Atherosclerosis  CXR (7/27):  No acute cardiopulmonary findings.  Assessment/Plan:  Principal Problem:   Atrial fibrillation with RVR (HCC) Active Problems:   Dyslipidemia   DM II (diabetes mellitus, type II), controlled ([place])   Acute-on-chronic kidney injury ([place])   Dyspnea  34 yoM w/ long pmhx notable for MI, CVA, HLD, T2DM and CKD admitted to the [facility] for observation of new onset atrial fibrillation with RVR. Since converted to SR with IV diltiazem and being rate controlled with carvedilol. Since admission he has developed an AKI.   Paroxysmal atrial fibrillation w/ RVR New oxygen requirement  Hypoxia  Dyspnea  Wheezing  Small pericardial effusion w/o tamponade physiology  Pt noted to have paroxysmal a.fib once yesterday ~6:30 am and today (7/27) around 3am. He is on carvedilol which initially adequately rate controlled him however he now continues to be tachycardic above goal HR. This prompted [place] to increase his carvedilol dose from 6.25 mg to 12.5 mg bid. Per chart review, it is unclear if this is a new onset a.fib. Past  documentation shows that he was scheduled to be monitored with ZioPatch after his strokes (2021) but unclear if that was executed. So consider ZioPatch at discharge. As for the etiology of the a.fib, we are considering non-cardiac (PE) and cardiac (pericardial effusion/pericarditis) processes. He is being anticoagulated, initially with heparin and now with apixaban, and has low likelihood for PE and given recent AKI will hold CTA for now. However, presentation of new O2 requirement, fever, DOE and chest pain does fit with a PE. CXR today was unremarkable. Secondly, TTE and CT abd showed small pericardial effusion which is concerning  for pericarditis. Bcx have been NGTD. Will get inflammatory markers today.  -f/u CMP and ESR -f/u bcx -Replete mag and K with goal of >2 and >4 respectively -increase carvedilol 12.5 mg bid  -duonebs for wheezing  -cont apixaban 81m QD -continue telemetry monitoring -daily weights with strict I/Os -continuous pulse ox -consider outpatient sleep study as recommended by cardiology -consider ZioPatch at discharge -Cards following, appreciate recs   Acute on Chronic kidney disease  Painless hematuria: Pt's creatine on an upward trend (1.49->1.74->1.85->1.86) and GFR decreased from 42->39. Urine is red in color and u/a showed hemoglobin, protein and RBCs. Change in urine color noted after heparin dose which is concerning for heparin induced bleeding. CT abd on 7/26 showed bilateral non-obstructive nephrolithiasis. We suspect bleeding could have been a combination of recent heparin and nephrolithiasis irritation of kidneys. Will continue to monitor.  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 Continue SGLT2i per cards recs. -cont Dapagliflozin 10 mg QD  HLD: Well controlled. On home atorvastatin.   HTN: Blood pressure has been running low since yesterday DBP around 47-77. Carvedilol is being increased with goal of better rate control of a.fib. Will speak with cardiology today about management.  -cont carvedilol 12.557mbid  T2DM: Diet controlled diabetes. Last A1c was 5.9 on 03/29/20.   Gout: Last flare up in 08/2019. Holding home ppx in setting of AKI.   Diet: VTE ppx: Code status:  Prior to Admission Living Arrangement: Home Anticipated Discharge Location: Home Barriers to Discharge: Clinical improvement   Dispo: Anticipated discharge after a.fib workup and resolution of AKI.    LOS: 0 days   GeGilford Silvius, Medical  Student 08/29/2020, 10:19 AM

## 2020-08-29 NOTE — Progress Notes (Addendum)
Provider paged and made aware pt is in a-fibb RVR with HR ranging between 120-150bpm. Pt in bed resting, no acute distress noted. Pt does not have any complaints. Dr. Allena Katz made aware and discussed patient's status with team. No current orders from provider at this time.

## 2020-08-29 NOTE — Progress Notes (Signed)
Colchicine for pericardial effusion dosing: Pt >70 kg so we will continue with 0.6mg  BID.  Ulyses Southward, PharmD, BCIDP, AAHIVP, CPP Infectious Disease Pharmacist 08/29/2020 6:13 PM

## 2020-08-30 ENCOUNTER — Inpatient Hospital Stay (HOSPITAL_COMMUNITY): Payer: Medicare HMO

## 2020-08-30 DIAGNOSIS — I300 Acute nonspecific idiopathic pericarditis: Secondary | ICD-10-CM | POA: Diagnosis not present

## 2020-08-30 DIAGNOSIS — I4891 Unspecified atrial fibrillation: Secondary | ICD-10-CM | POA: Diagnosis not present

## 2020-08-30 DIAGNOSIS — N170 Acute kidney failure with tubular necrosis: Secondary | ICD-10-CM | POA: Diagnosis not present

## 2020-08-30 DIAGNOSIS — N183 Chronic kidney disease, stage 3 unspecified: Secondary | ICD-10-CM | POA: Diagnosis not present

## 2020-08-30 LAB — BASIC METABOLIC PANEL
Anion gap: 4 — ABNORMAL LOW (ref 5–15)
BUN: 24 mg/dL — ABNORMAL HIGH (ref 8–23)
CO2: 25 mmol/L (ref 22–32)
Calcium: 8.2 mg/dL — ABNORMAL LOW (ref 8.9–10.3)
Chloride: 107 mmol/L (ref 98–111)
Creatinine, Ser: 1.62 mg/dL — ABNORMAL HIGH (ref 0.61–1.24)
GFR, Estimated: 46 mL/min — ABNORMAL LOW (ref >60)
Glucose, Bld: 165 mg/dL — ABNORMAL HIGH (ref 70–99)
Potassium: 3.6 mmol/L (ref 3.5–5.1)
Sodium: 136 mmol/L (ref 135–145)

## 2020-08-30 LAB — RESPIRATORY PANEL BY PCR

## 2020-08-30 LAB — URINE CULTURE: Culture: NO GROWTH

## 2020-08-30 LAB — CBC
HCT: 34.6 % — ABNORMAL LOW (ref 39.0–52.0)
Hemoglobin: 11.8 g/dL — ABNORMAL LOW (ref 13.0–17.0)
MCH: 32.7 pg (ref 26.0–34.0)
MCHC: 34.1 g/dL (ref 30.0–36.0)
MCV: 95.8 fL (ref 80.0–100.0)
Platelets: 243 10*3/uL (ref 150–400)
RBC: 3.61 MIL/uL — ABNORMAL LOW (ref 4.22–5.81)
RDW: 14.8 % (ref 11.5–15.5)
WBC: 10.6 10*3/uL — ABNORMAL HIGH (ref 4.0–10.5)
nRBC: 0 % (ref 0.0–0.2)

## 2020-08-30 LAB — SEDIMENTATION RATE: Sed Rate: 86 mm/hr — ABNORMAL HIGH (ref 0–16)

## 2020-08-30 IMAGING — CT CT HEAD W/O CM
4 series · 15 of 47 positions shown, 17 images · non-contrast
Comparison: [DATE]

CLINICAL DATA: Altered mental status

EXAM:
CT HEAD WITHOUT CONTRAST
TECHNIQUE: Contiguous axial images were obtained from the base of the skull
through the vertex without intravenous contrast.

[Series 2: head without · axial · non-contrast · 0.45mm/px · z∈[-168,-48]mm · 7 of 34 slices shown, 9 images]
[im 5/34  brain]
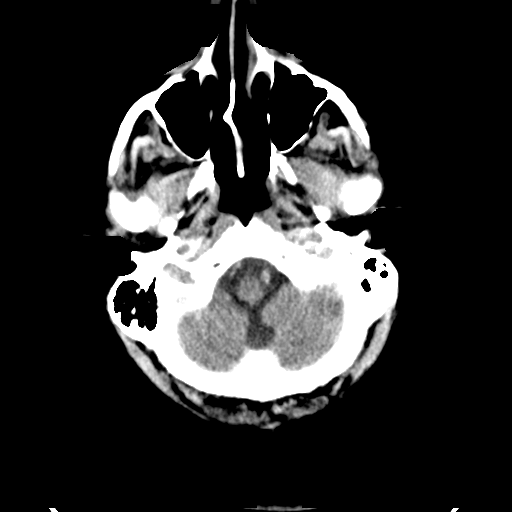
[im 5/34  bone]
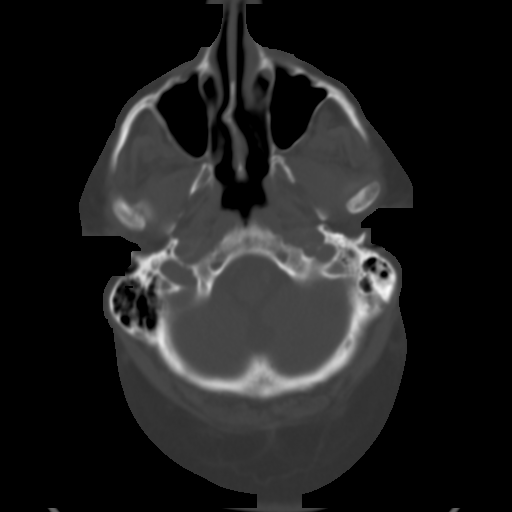
[im 9/34  brain]
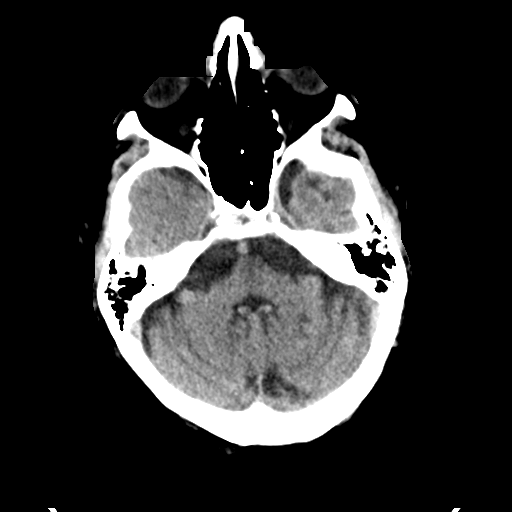
[im 13/34  brain]
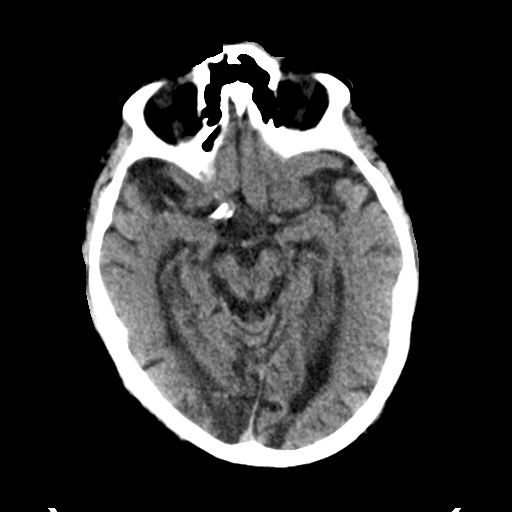
[im 17/34  brain]
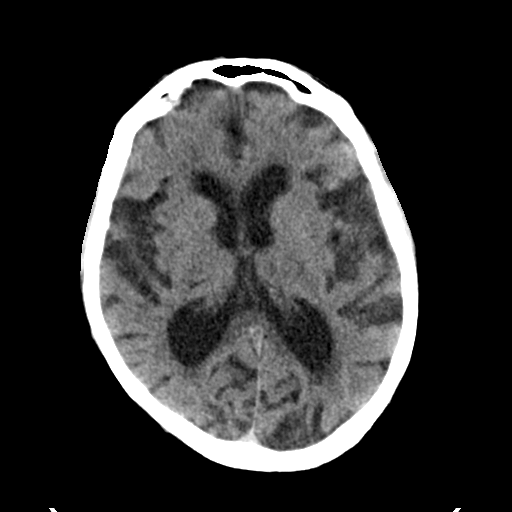
[im 21/34  brain]
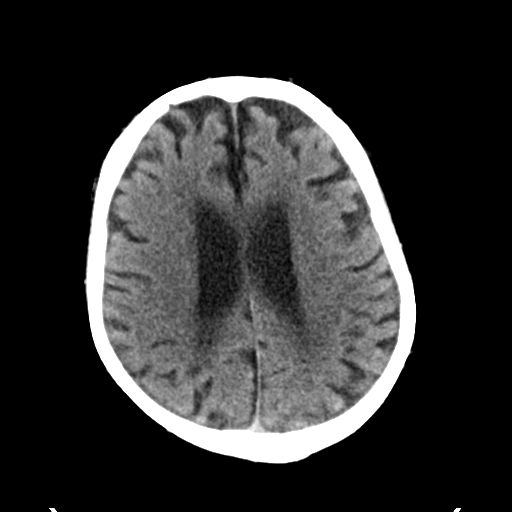
[im 21/34  bone]
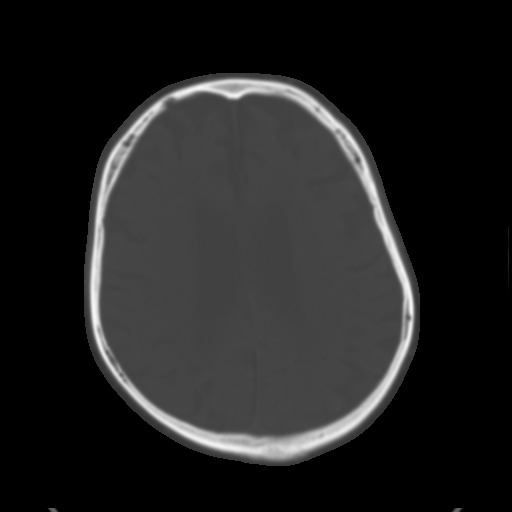
[im 25/34  brain]
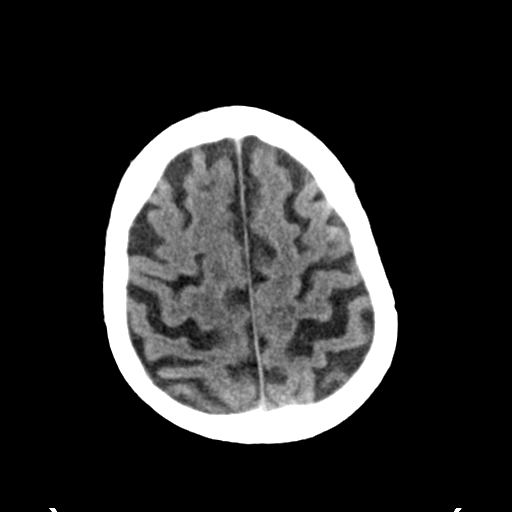
[im 29/34  brain]
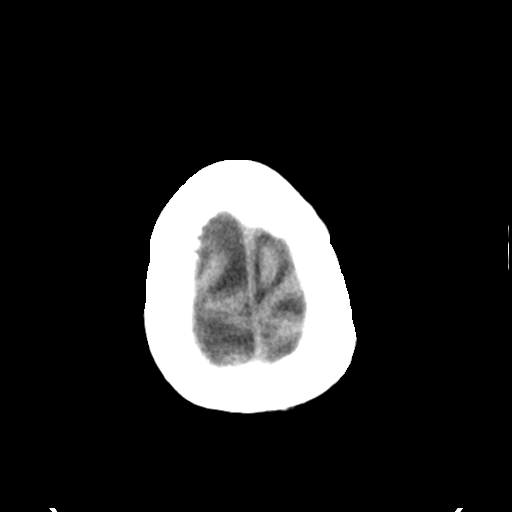

[Series 3: head bone · axial · 0.45mm/px · z∈[-172,-156]mm · 2 of 85 slices shown]
[im 9/85  bone]
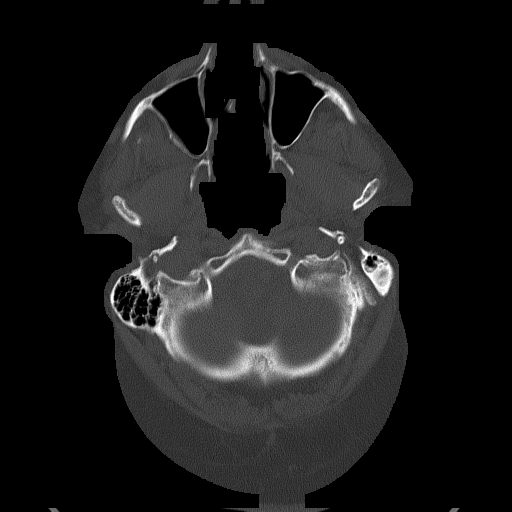
[im 17/85  bone]
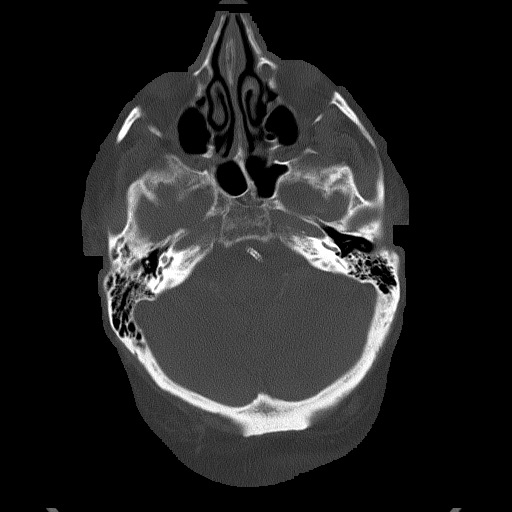

[Series 4: head without cor · coronal · non-contrast · 0.33mm/px · 3 of 71 slices shown]
[im 24/71  brain]
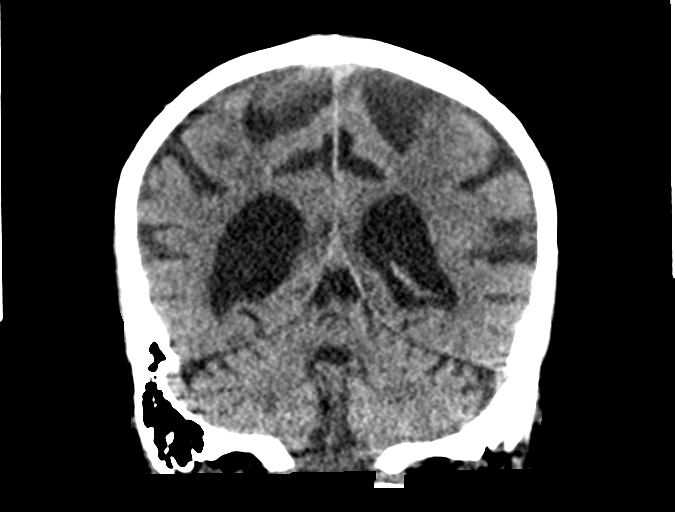
[im 32/71  brain]
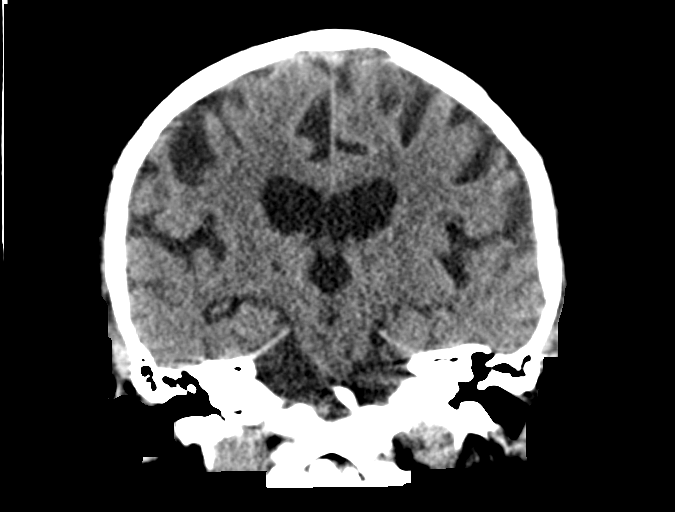
[im 39/71  brain]
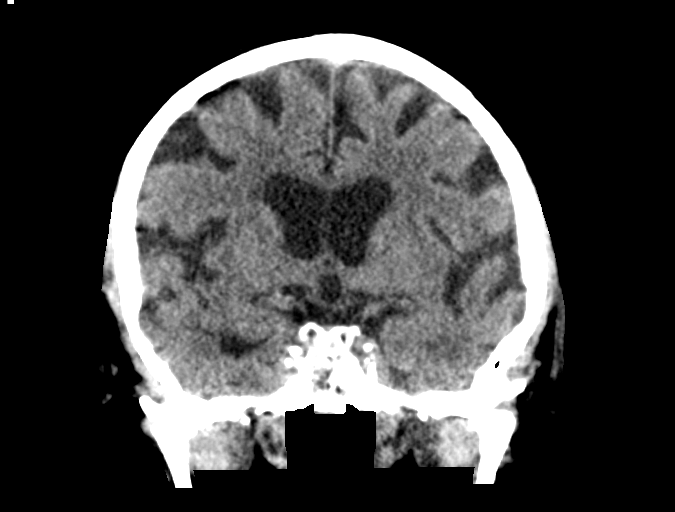

[Series 5: head without sag · sagittal · non-contrast · 0.33mm/px · 3 of 67 slices shown]
[im 23/67  brain]
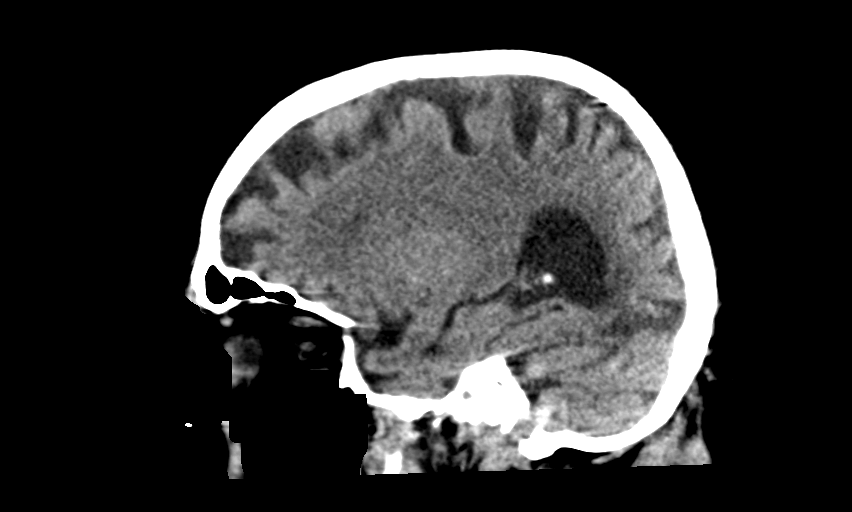
[im 34/67  brain]
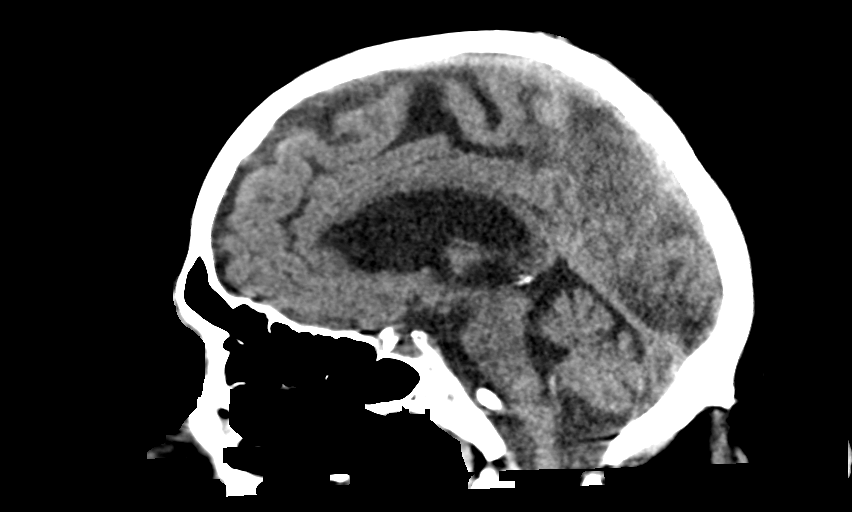
[im 45/67  brain]
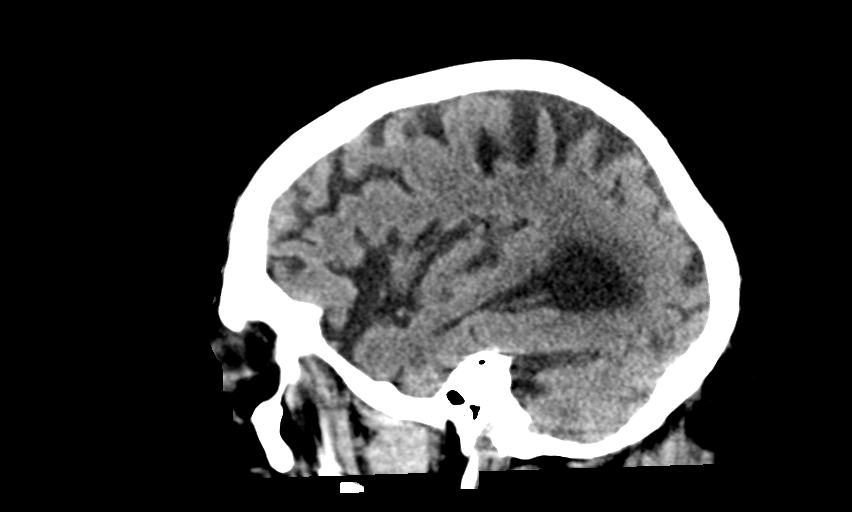

[15 of 47 positions shown; findings below may reference images not displayed]

FINDINGS: Brain: Old bilateral occipital infarcts. Appearance is stable. There
is atrophy and chronic small vessel disease changes. No acute
intracranial abnormality. Specifically, no hemorrhage,
hydrocephalus, mass lesion, acute infarction, or significant
intracranial injury.

Vascular: No hyperdense vessel or unexpected calcification.

Skull: No acute calvarial abnormality.

Sinuses/Orbits: No acute findings

Other: None
IMPRESSION: Remote infarcts in the occipital lobes.

Atrophy, chronic microvascular disease.

No acute intracranial abnormality.

## 2020-08-30 MED ORDER — LACTATED RINGERS IV BOLUS
500.0000 mL | Freq: Once | INTRAVENOUS | Status: AC
  Administered 2020-08-30: 500 mL via INTRAVENOUS

## 2020-08-30 NOTE — Progress Notes (Signed)
Physical Therapy Evaluation Patient Details Name: [name] MRN: [id number] DOB: [birth year] Today's Date: 08/30/2020   History of Present Illness  Pt is a 70yo male who presents to [facility] ED on 7/25 with dizziness and SOB, found to have new atrial fibrillation with RVR. Xray no significant findings. CT abdomen/pelvis found mild pericardial effusion. PMH: multiple CVA, DM, CKD, HTN.  Clinical Impression  Pt presents with the impairments above and problems below. Pt required mod assist +2 for bed mobility and transfers and demonstrated posterolateral lean to the right. Pt was able to take several steps EOB but required cuing for advancement of RLE and struggled to clear foot in both forward and backward walking. Upon further questioning, pt nor pt's wife could determine if these were baseline. Pt demonstrated decreased coordination especially of RUE. Communicated findings to RN and MD. HR and O2 sats were monitored via telemetry and found to be within acceptable limits. Pt is motivated to improve independence with mobility and is a good candidate for CIR-level therapies. Pt has good support from wife at home. Pt will benefit from acute skilled therapy to promote functional independence.     Follow Up Recommendations CIR    Equipment Recommendations  3in1 (PT)    Recommendations for Other Services       Precautions / Restrictions Precautions Precautions: Fall Restrictions Weight Bearing Restrictions: No      Mobility  Bed Mobility Overal bed mobility: Needs Assistance Bed Mobility: Supine to Sit;Sit to Supine     Supine to sit: Mod assist;+2 for physical assistance Sit to supine: Mod assist;+2 for physical assistance   General bed mobility comments: Pt required mod assist +2 for trunk assist and LE assist to sit up at EOB. Required assist to scoot hips to EOB as pt unable.  Pt demonstrated increased SOB upon sitting upright that calmed with pursed lip breathing. Oxygen sats WFL. Noted  R lateral and posterior lean in sitting, requiring min to mod A to maintain balance. Required assist for trunk and LE assist to return to supine.    Transfers Overall transfer level: Needs assistance Equipment used: 2 person hand held assist Transfers: Sit to/from Stand Sit to Stand: Mod assist;+2 physical assistance         General transfer comment: Pt required mod assist +2 for steadying and lift assist to perform transfer. Once standing pt was leaning posteriorly with weight on heels and required verbal and tactile cues to weight shift forward, as well as physical assistance to perform anterior movement. Also noted R lateral lean.  Ambulation/Gait Ambulation/Gait assistance: Min assist;+2 physical assistance;Mod assist Gait Distance (Feet):  (forward/backward walking EOB) Assistive device: 2 person hand held assist Gait Pattern/deviations: Decreased step length - right;Decreased dorsiflexion - right;Decreased weight shift to right;Leaning posteriorly Gait velocity: decreased   General Gait Details: Pt required multimodal cuing to advance R LE, noted decreased R ankle DF, R knee flexion, R hip flexion. Tended to "drag" RLE when taking steps. Unsteady and required min to mod A +2 for steadying.  Stairs            Wheelchair Mobility    Modified Rankin (Stroke Patients Only) Modified Rankin (Stroke Patients Only) Pre-Morbid Rankin Score: No significant disability Modified Rankin: Moderately severe disability     Balance Overall balance assessment: Needs assistance Sitting-balance support: Bilateral upper extremity supported Sitting balance-Leahy Scale: Poor Sitting balance - Comments: Pt had posteriolateral trunk lean to the right upon sitting EOB. Required min to mod A to maintain  sitting balance Postural control: Posterior lean;Right lateral lean Standing balance support: Bilateral upper extremity supported Standing balance-Leahy Scale: Poor Standing balance comment:  Pt required bilateral UE support with HHA +2 to remain standing, leaned posteriorly and required physical assitance to correct, trouble weight-shifting to right.                             Pertinent Vitals/Pain Pain Assessment: No/denies pain    Home Living Family/patient expects to be discharged to:: Private residence Living Arrangements: Spouse/significant other Available Help at Discharge: Family;Available 24 hours/day Type of Home: House Home Access: Stairs to enter Entrance Stairs-Rails: Left Entrance Stairs-Number of Steps: 5 Home Layout: One level Home Equipment: Walker - 2 wheels;Cane - single point;Grab bars - toilet      Prior Function Level of Independence: Independent               Hand Dominance        Extremity/Trunk Assessment   Upper Extremity Assessment Upper Extremity Assessment: Defer to OT evaluation;RUE deficits/detail RUE Coordination: decreased gross motor    Lower Extremity Assessment Lower Extremity Assessment: Generalized weakness;RLE deficits/detail RLE Deficits / Details: Noted functional weakness during gait tasks. Tended to drag RLE.    Cervical / Trunk Assessment Cervical / Trunk Assessment: Normal  Communication   Communication: Expressive difficulties (some trouble with clear phonation, mumbling likely secondary to prior CVA)  Cognition Arousal/Alertness: Awake/alert Behavior During Therapy: WFL for tasks assessed/performed Overall Cognitive Status: History of cognitive impairments - at baseline                                 General Comments: Hx of multiple CVAs      General Comments General comments (skin integrity, edema, etc.): Pt reports reduced vision secondary to history of CVA. Wife present in room. Pt and pt's wife unable to report if R sided deficits are baseline. States "I've never noticed it before"    Exercises     Assessment/Plan    PT Assessment Patient needs continued PT  services  PT Problem List Decreased strength;Decreased range of motion;Decreased activity tolerance;Decreased balance;Decreased mobility;Decreased coordination;Decreased safety awareness;Cardiopulmonary status limiting activity;Decreased cognition       PT Treatment Interventions DME instruction;Gait training;Stair training;Functional mobility training;Therapeutic activities;Therapeutic exercise;Balance training;Patient/family education;Cognitive remediation;Neuromuscular re-education    PT Goals (Current goals can be found in the Care Plan section)  Acute Rehab PT Goals Patient Stated Goal: To improve independence PT Goal Formulation: With patient/family Time For Goal Achievement: 09/13/20 Potential to Achieve Goals: Good    Frequency Min 3X/week   Barriers to discharge        Co-evaluation               AM-PAC PT "6 Clicks" Mobility  Outcome Measure Help needed turning from your back to your side while in a flat bed without using bedrails?: A Lot Help needed moving from lying on your back to sitting on the side of a flat bed without using bedrails?: Total Help needed moving to and from a bed to a chair (including a wheelchair)?: Total Help needed standing up from a chair using your arms (e.g., wheelchair or bedside chair)?: Total Help needed to walk in hospital room?: Total Help needed climbing 3-5 steps with a railing? : Total 6 Click Score: 7    End of Session Equipment Utilized During Treatment: Gait belt Activity  Tolerance: Patient tolerated treatment well Patient left: in bed;with call bell/phone within reach;with family/visitor present Nurse Communication: Mobility status;Other (comment) (Pt demonstrated balance deficits R>L and decreased coordination of the RUE. Pt nor wife were able to report if these were extant prior to current stay; this was communicated to RN and MD.) PT Visit Diagnosis: Unsteadiness on feet (R26.81);Muscle weakness (generalized)  (M62.81);Difficulty in walking, not elsewhere classified (R26.2);Other symptoms and signs involving the nervous system (R29.898)    Time: 8338-2505 PT Time Calculation (min) (ACUTE ONLY): 23 min   Charges:   PT Evaluation $PT Eval Moderate Complexity: 1 Mod PT Treatments $Therapeutic Activity: 8-22 mins       [name], SPT  [name] 08/30/2020, 1:59 PM

## 2020-08-30 NOTE — Progress Notes (Addendum)
Subjective:  Patient had an episode of emesis yesterday, since resolved with ondansetron. Family at bedside this morning during rounds. Pt denies SOB or chest pain this morning.   Objective:  Physical exam: General: Well appearing man sitting up in bed  HEENT: Normocephalic atraumatic head  CV: Regular rate and no murmurs appreciated  Lungs: Clear to auscultation bilaterally  Abdomen: distended abdomen  Legs: No LEE Psych: Happy mood and appropriate affect  Neuro:  Mental: alert, attentive and oriented. CN: CN 3-12 intact. Loss of central vision in right eye with only peripheral vision. Exam in 12/21 found visual fields with dense right homonymous hemianopsia to confrontation.  Motor: Equal strength in upper and lower extremities. Reflexes: Equal bicep and knee reflexes. Ankle reflexes difficult to appreciate bilaterally.  Sensory: Intact sensation in upper and lower extremities.  Coordination: Finger to nose affected by loss of vision in right eye.  Gait: A right leaning posture while sitting and standing. Difficulty sitting up form bed and standing from sitting position.    Vital signs in last 24 hours: Vitals:   08/29/20 1910 08/29/20 2312 08/30/20 0429 08/30/20 0805  BP: 107/69 110/71 108/62 114/70  Pulse: 74 66 64 69  Resp: 16 18 20 18   Temp: 99 F (37.2 C) 98.7 F (37.1 C) 98.3 F (36.8 C) 98 F (36.7 C)  TempSrc: Oral Oral Oral Oral  SpO2: 94% 97% 97% 98%  Weight:   102.4 kg   Height:       Weight change: 1 kg  Intake/Output Summary (Last 24 hours) at 08/30/2020 1123 Last data filed at 08/30/2020 0656 Gross per 24 hour  Intake 1533.1 ml  Output 1350 ml  Net 183.1 ml   Erythrocyte Sedimentation Rate:    Component Value Date/Time   ESRSEDRATE 86 (H) 08/30/2020 0138   CBC Latest Ref Rng & Units 08/30/2020 08/29/2020 08/28/2020  WBC 4.0 - 10.5 K/uL 10.6(H) 10.9(H) 11.6(H)  Hemoglobin 13.0 - 17.0 g/dL 11.8(L) 11.6(L) 12.1(L)  Hematocrit 39.0 - 52.0 % 34.6(L)  34.0(L) 36.3(L)  Platelets 150 - 400 K/uL 243 217 215   BMP Latest Ref Rng & Units 08/30/2020 08/29/2020 08/28/2020  Glucose 70 - 99 mg/dL 08/30/2020) 161(W) 960(A)  BUN 8 - 23 mg/dL 540(J) 81(X) 21  Creatinine 0.61 - 1.24 mg/dL 91(Y) 7.82(N) 5.62(Z)  Sodium 135 - 145 mmol/L 136 135 137  Potassium 3.5 - 5.1 mmol/L 3.6 3.8 3.4(L)  Chloride 98 - 111 mmol/L 107 107 106  CO2 22 - 32 mmol/L 25 19(L) 23  Calcium 8.9 - 10.3 mg/dL 8.2(L) 8.3(L) 8.2(L)   Micro: Ucx: pending  Bcx: NG 24 hrs  Assessment/Plan:  Principal Problem:   Atrial fibrillation with RVR (HCC) Active Problems:   Dyslipidemia   DM II (diabetes mellitus, type II), controlled (HCC)   Acute-on-chronic kidney injury (HCC)   Dyspnea  Paroxysmal atrial fibrillation w/ RVR 2/2 post-viral pericarditis  Resolved new oxygen requirement  Hypoxia  Dyspnea  Wheezing Pt who presented with a.fib. Small circumferential pericardial effusion found on CT, inflammatory markers were elevated. Presentation concerning for a.fib induced by pericarditis. Pt continues to be afebrile. Pt started on colchicine 0.6 mg bid yesterday. Has been adequately rate controlled with current dose of carvedilol. Cardiology would like an echo in the am given risk of bleeding into the pericardial fluid from apixiban.  -echo in am -cont colchicine 0.6 mg BID and discharge with 3 month course  -cont carvedilol 12.5 mg bid -cont apixaban 5mg  daily  -daily weight  and strict I/Os -consider an echocardiogram outpatient at 1 month to assess pericarditis progression  -Cardiology following appreciate recs  -PT/OT consult before discharge   Acute on chronic kidney disease  Painless hematuria: Creatinine improving (1.86->1.62) and approaching baseline of ~1.4. Ucx showed no growth. Kidney function is improving with IV fluids.  -IV fluids  -cont to avoid nephrotoxic drugs   New posterior and R. Lateral lean  R. Foot drag: Patient has history of CVA  X4 and a.fib during  this presentation. On neurologic exam, postural change was appreciated where patient had a right sided lean. Patient anticoagulated on apixiban. Given this acute change during this hospitalization, will get imaging for further evaluation.   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 Continue Dapagliflozin 10 mg QD.   HLD: Well controlled. On home atorvastatin.  HTN: Blood pressure has been stable. Continue carvedilol 12.5mg  bid.   T2DM: Diet controlled diabetes. Last A1c was 5.9 on 03/29/20.   Gout: Last flare up in 08/2019. Holding home ppx in setting of AKI.   Diet: heart healthy  VTE ppx: apixaban  Code status: full   Prior to Admission Living Arrangement: Home Anticipated Discharge Location: Home Barriers to Discharge: Clinical improvement   Dispo: Anticipated discharge tomorrow.    LOS: 1 day   [name], Medical Student 08/30/2020, 11:23 AM

## 2020-08-30 NOTE — Progress Notes (Addendum)
The patient has been seen in conjunction with Laverda Page, NP-C. All aspects of care have been considered and discussed. The patient has been personally interviewed, examined, and all clinical data has been reviewed.  Agree with contents of this note. Will repeat limited echo tomorrow to ensure that pericardial effusion is not increasing on anticoagulation therapy. Current diagnosis is probable viral syndrome with acute pericarditis.  Progress Note  Patient Name: [name] Date of Encounter: 08/30/2020  [facility] Cardiologist: None   Subjective   Actually reports feeling much better this morning. Chest pain and breathing improved.   His nausea now resolved. Has been keeping fluids down this morning.   Family at the bedside.   Inpatient Medications    Scheduled Meds:  apixaban  5 mg Oral BID   aspirin EC  81 mg Oral Daily   atorvastatin  80 mg Oral q1800   carvedilol  12.5 mg Oral BID WC   colchicine  0.6 mg Oral BID   dapagliflozin propanediol  10 mg Oral Daily   FLUoxetine  10 mg Oral Daily   multivitamin with minerals  1 tablet Oral Daily   Continuous Infusions:  promethazine (PHENERGAN) injection (IM or IVPB)     PRN Meds: acetaminophen, ipratropium-albuterol, polyethylene glycol **OR** senna, promethazine (PHENERGAN) injection (IM or IVPB)   Vital Signs    Vitals:   08/29/20 1910 08/29/20 2312 08/30/20 0429 08/30/20 0805  BP: 107/69 110/71 108/62 114/70  Pulse: 74 66 64 69  Resp: 16 18 20 18   Temp: 99 F (37.2 C) 98.7 F (37.1 C) 98.3 F (36.8 C) 98 F (36.7 C)  TempSrc: Oral Oral Oral Oral  SpO2: 94% 97% 97% 98%  Weight:   102.4 kg   Height:        Intake/Output Summary (Last 24 hours) at 08/30/2020 0927 Last data filed at 08/30/2020 0656 Gross per 24 hour  Intake 1533.1 ml  Output 1350 ml  Net 183.1 ml   Last 3 Weights 08/30/2020 08/29/2020 08/28/2020  Weight (lbs) 225 lb 12 oz 223 lb 8.7 oz 220 lb 7.4 oz  Weight (kg) 102.4 kg 101.4 kg  100 kg      Telemetry    SR - Personally Reviewed  ECG    No new tracing.  Physical Exam   GEN: Looks drained, but positive demeanor.  Neck: No JVD Cardiac: RRR, no murmurs, rubs, or gallops.  Respiratory: Clear to auscultation bilaterally. GI: Soft, nontender, non-distended  MS: No edema; No deformity. Neuro:  Nonfocal  Psych: Normal affect   Labs    High Sensitivity Troponin:   Recent Labs  Lab 08/27/20 0907 08/27/20 1107  TROPONINIHS 27* 23*      Chemistry Recent Labs  Lab 08/27/20 0907 08/28/20 0443 08/29/20 0603  NA 137 137 135  K 3.7 3.4* 3.8  CL 106 106 107  CO2 23 23 19*  GLUCOSE 136* 134* 117*  BUN 17 21 24*  CREATININE 1.74* 1.85* 1.86*  CALCIUM 8.2* 8.2* 8.3*  PROT 6.4*  --   --   ALBUMIN 2.6*  --   --   AST 28  --   --   ALT 22  --   --   ALKPHOS 107  --   --   BILITOT 1.1  --   --   GFRNONAA 42* 39* 39*  ANIONGAP 8 8 9      Hematology Recent Labs  Lab 08/28/20 0443 08/29/20 0400 08/30/20 0138  WBC 11.6* 10.9* 10.6*  RBC 3.79* 3.55* 3.61*  HGB 12.1* 11.6* 11.8*  HCT 36.3* 34.0* 34.6*  MCV 95.8 95.8 95.8  MCH 31.9 32.7 32.7  MCHC 33.3 34.1 34.1  RDW 14.9 14.6 14.8  PLT 215 217 243    BNP Recent Labs  Lab 08/27/20 0907  BNP 236.8*     DDimer  Recent Labs  Lab 08/27/20 0907  DDIMER 1.82*     Radiology    CT ABDOMEN PELVIS WO CONTRAST  Result Date: 08/28/2020 CLINICAL DATA:  Gross hematuria. EXAM: CT ABDOMEN AND PELVIS WITHOUT CONTRAST TECHNIQUE: Multidetector CT imaging of the abdomen and pelvis was performed following the standard protocol without IV contrast. COMPARISON:  None. FINDINGS: Lower chest: Mild bilateral posterior basilar subsegmental atelectasis is noted. Mild pericardial effusion is noted. Hepatobiliary: Cholelithiasis is noted. No biliary dilatation is noted. The liver is unremarkable. Pancreas: Unremarkable. No pancreatic ductal dilatation or surrounding inflammatory changes. Spleen: Normal in size  without focal abnormality. Adrenals/Urinary Tract: Adrenal glands appear normal. Right renal cyst is noted. Nonobstructive bilateral nephrolithiasis is noted. Right renal cyst is noted. No hydronephrosis or renal obstruction is noted. Urinary bladder is unremarkable. Stomach/Bowel: Stomach is within normal limits. Appendix appears normal. No evidence of bowel wall thickening, distention, or inflammatory changes. Vascular/Lymphatic: Aortic atherosclerosis. No enlarged abdominal or pelvic lymph nodes. Reproductive: Prostate is unremarkable. Other: Moderate size fat containing periumbilical hernia is noted. No ascites is noted. Musculoskeletal: No acute or significant osseous findings. IMPRESSION: Bilateral nonobstructive nephrolithiasis. No hydronephrosis or renal obstruction is noted. Cholelithiasis. Moderate size fat containing periumbilical hernia. Mild pericardial effusion. Aortic Atherosclerosis (ICD10-I70.0). Electronically Signed   By: Lupita Raider M.D.   On: 08/28/2020 18:38   DG Chest 1 View  Result Date: 08/29/2020 CLINICAL DATA:  Tachycardia and shortness of breath. EXAM: CHEST  1 VIEW COMPARISON:  08/27/2020 FINDINGS: The heart is upper limits of normal in size given the AP projection and portable technique. No acute pulmonary findings. The bony structures are intact. IMPRESSION: No acute cardiopulmonary findings. Electronically Signed   By: Rudie Meyer M.D.   On: 08/29/2020 09:49   NM Pulmonary Perfusion  Result Date: 08/28/2020 CLINICAL DATA:  Suspected P. Positive D-dimer. EXAM: NUCLEAR MEDICINE PERFUSION LUNG SCAN TECHNIQUE: Perfusion images were obtained in multiple projections after intravenous injection of radiopharmaceutical. Ventilation scans intentionally deferred if perfusion scan and chest x-ray adequate for interpretation during COVID 19 epidemic. RADIOPHARMACEUTICALS:  4.4 mCi Tc-36m MAA IV COMPARISON:  Chest x-ray 08/27/2020. FINDINGS: Findings again noted consistent with  cardiomegaly. A faint perfusion defect on the left cannot be excluded. Pulmonary embolus cannot be excluded. IMPRESSION: Cardiomegaly. A faint perfusion defect on the left cannot be excluded. Pulmonary embolus cannot be excluded. Electronically Signed   By: Maisie Fus  Register   On: 08/28/2020 10:00    Cardiac Studies   Echo: 08/27/20  IMPRESSIONS     1. Left ventricular ejection fraction, by estimation, is 40 to 45%. The  left ventricle has mildly decreased function. The left ventricle  demonstrates regional wall motion abnormalities with apical hypokinesis.  Left ventricular diastolic parameters are  consistent with Grade I diastolic dysfunction (impaired relaxation).   2. Right ventricular systolic function is normal. The right ventricular  size is normal. Tricuspid regurgitation signal is inadequate for assessing  PA pressure.   3. Left atrial size was mildly dilated.   4. The mitral valve is normal in structure. No evidence of mitral valve  regurgitation. No evidence of mitral stenosis.   5. The aortic valve  is tricuspid. Aortic valve regurgitation is trivial.  Mild aortic valve sclerosis is present, with no evidence of aortic valve  stenosis.   6. The inferior vena cava is dilated in size with >50% respiratory  variability, suggesting right atrial pressure of 8 mmHg.   7. A small pericardial effusion is present. There does appear to be  respirophasic variation of the interventricular septum visually, and the  mitral valve E inflow doppler velocity varies > 25% with inspiration. I do  not think that there is pericardial  tamponade, however there may be a component of pericardial constriction.   Patient Profile     70 y.o. 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  Assessment & Plan    Paroxysmal Afib: episodes were relatively short, currently maintaining SR.  --On Eliquis for stroke risk reduction, along with coreg 12.5mg  BID -- recommendations for outpt sleep study  Pericardial effusion/pericarditis: Echo with small pericardial effusion. Presented with chest pain. CRP/Sed rate were significantly elevated yesterday.  -- Started on colchicine 0.6mg  BID and reports significant improvement in symptoms. Have avoided NSAIDs with his CKD history. Given improved with symptoms, will defer addition of steroids for now.   Fever with n/v: unclear etiology. Related to pericarditis? temp max 101.5 yesterday. No fevers overnight or this morning. Also with improvement in nausea/vomiting tolerating clear liquids.  -- Blood cultures NTD -- management per primary  CKD stage 3: baseline Cr around 1.4-1.5 -- Cr up to 1.7>>1.8 in the setting of vomiting/dehydration. Has been receiving IVFs -- BMET pending  DM: Hgb A1c 6.2 -- started on SGLT2, close monitoring in the setting of n/v/volume depletion  HLD: on statin  Hx of CVA: on ASA, statin. Seems initially planned for [facility] to monitor for afib. Now with confirmed Dx on [facility]  For questions or updates, please contact [facility] Please consult [email or web address] for contact info under        Signed, Laverda Page, NP  08/30/2020, 9:27 AM

## 2020-08-30 NOTE — Progress Notes (Signed)
Rehab Admissions Coordinator Note:  Patient was screened by Clois Dupes for appropriateness for an Inpatient Acute Rehab Consult per therapy recommendations. OT eval pending.   It is unlikely that [facility] will approve hospital based rehab/CIR for this diagnosis. Await therapy progression over next 24 hrs. If fails to progress, I will place rehab consult and we can attempt approval for CIR with payor.I will not place rehab consult today.  Clois Dupes RN MSN 08/30/2020, 2:19 PM  I can be reached at [phone].

## 2020-08-30 NOTE — Progress Notes (Signed)
Patient woke up slightly confused but reoriented and he went back to bed. Assessments remained unchanged.

## 2020-08-30 NOTE — Progress Notes (Signed)
Pt's HR is in 110s-140s was sustaining in the 60s-70s all day. Will notify team and wait for further instructions.

## 2020-08-31 ENCOUNTER — Inpatient Hospital Stay (HOSPITAL_COMMUNITY): Payer: Medicare HMO

## 2020-08-31 DIAGNOSIS — R079 Chest pain, unspecified: Secondary | ICD-10-CM | POA: Diagnosis present

## 2020-08-31 DIAGNOSIS — I4891 Unspecified atrial fibrillation: Secondary | ICD-10-CM | POA: Diagnosis not present

## 2020-08-31 DIAGNOSIS — I313 Pericardial effusion (noninflammatory): Secondary | ICD-10-CM

## 2020-08-31 DIAGNOSIS — N170 Acute kidney failure with tubular necrosis: Secondary | ICD-10-CM | POA: Diagnosis not present

## 2020-08-31 DIAGNOSIS — I300 Acute nonspecific idiopathic pericarditis: Secondary | ICD-10-CM | POA: Diagnosis not present

## 2020-08-31 DIAGNOSIS — E1122 Type 2 diabetes mellitus with diabetic chronic kidney disease: Secondary | ICD-10-CM | POA: Diagnosis not present

## 2020-08-31 LAB — BASIC METABOLIC PANEL
Anion gap: 9 (ref 5–15)
BUN: 28 mg/dL — ABNORMAL HIGH (ref 8–23)
CO2: 22 mmol/L (ref 22–32)
Calcium: 8.3 mg/dL — ABNORMAL LOW (ref 8.9–10.3)
Chloride: 106 mmol/L (ref 98–111)
Creatinine, Ser: 1.68 mg/dL — ABNORMAL HIGH (ref 0.61–1.24)
GFR, Estimated: 44 mL/min — ABNORMAL LOW (ref >60)
Glucose, Bld: 128 mg/dL — ABNORMAL HIGH (ref 70–99)
Potassium: 4.4 mmol/L (ref 3.5–5.1)
Sodium: 137 mmol/L (ref 135–145)

## 2020-08-31 LAB — CBC
HCT: 32.9 % — ABNORMAL LOW (ref 39.0–52.0)
Hemoglobin: 11 g/dL — ABNORMAL LOW (ref 13.0–17.0)
MCH: 32.1 pg (ref 26.0–34.0)
MCHC: 33.4 g/dL (ref 30.0–36.0)
MCV: 95.9 fL (ref 80.0–100.0)
Platelets: 263 10*3/uL (ref 150–400)
RBC: 3.43 MIL/uL — ABNORMAL LOW (ref 4.22–5.81)
RDW: 14.9 % (ref 11.5–15.5)
WBC: 8.8 10*3/uL (ref 4.0–10.5)
nRBC: 0 % (ref 0.0–0.2)

## 2020-08-31 LAB — ECHOCARDIOGRAM LIMITED
Height: 70 in
Weight: 3622.6 oz

## 2020-08-31 LAB — CK: Total CK: 146 U/L (ref 49–397)

## 2020-08-31 IMAGING — MR MR HEAD W/O CM
12 of 13 series · 44 of 48 positions shown · non-contrast
Comparison: Comparison made with prior CT from [DATE] as well
as previous MRI from [DATE].

CLINICAL DATA: Initial evaluation for neuro deficit, stroke
suspected.

EXAM:
MRI HEAD WITHOUT CONTRAST
TECHNIQUE: Multiplanar, multiecho pulse sequences of the brain and surrounding
structures were obtained without intravenous contrast.

[Series 13: DWI · axial · 3.0mm · 0.92mm/px · z∈[-77,+69]mm · 8 of 100 slices shown (1 of 4)]
[im 1/100]
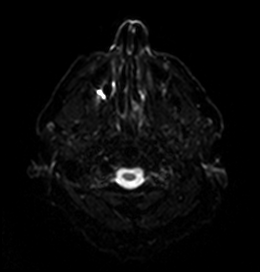
[im 15/100]
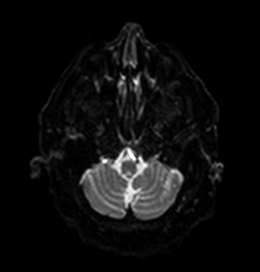
[im 29/100]
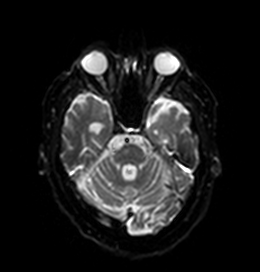
[im 43/100]
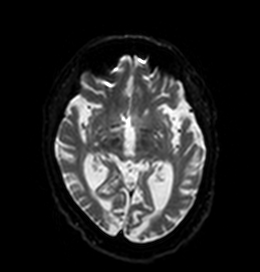
[im 57/100]
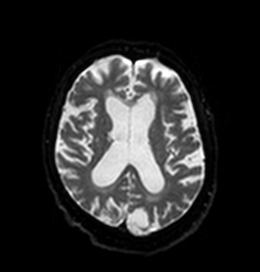
[im 71/100]
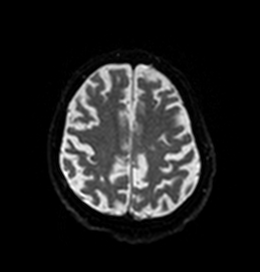
[im 85/100]
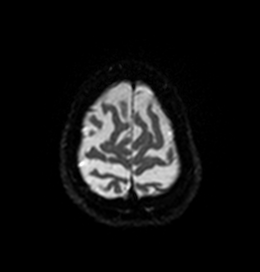
[im 100/100]
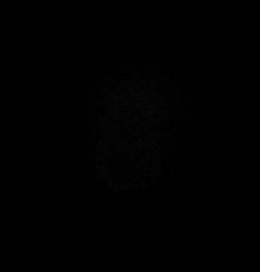

[Series 14: DWI · axial · 3.0mm · 0.92mm/px · z∈[-77,+69]mm · 4 of 50 slices shown (2 of 4)]
[im 1/50]
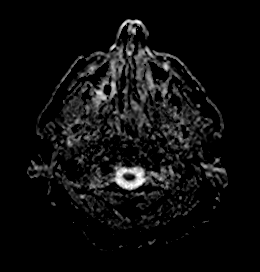
[im 17/50]
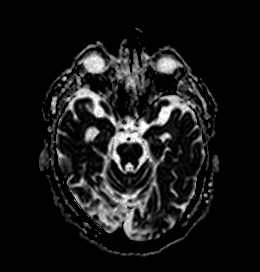
[im 33/50]
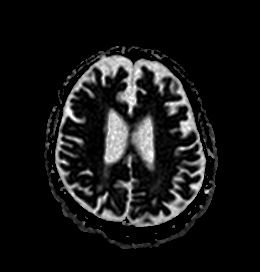
[im 50/50]
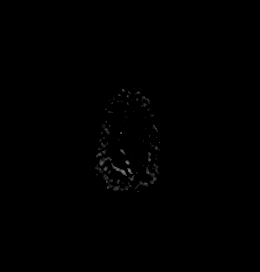

[Series 15: DWI · coronal · 4.0mm · 0.88mm/px · 5 of 72 slices shown (3 of 4)]
[im 1/72]
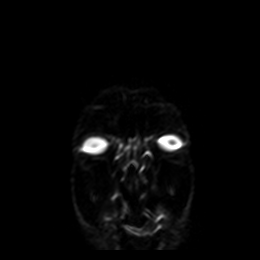
[im 18/72]
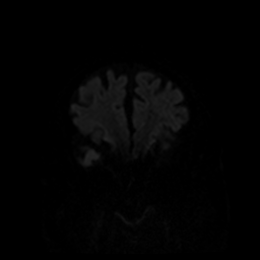
[im 36/72]
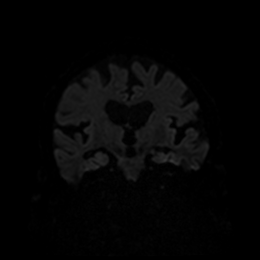
[im 54/72]
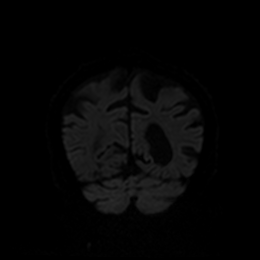
[im 72/72]
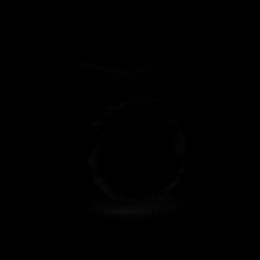

[Series 16: DWI · coronal · 4.0mm · 0.88mm/px · 3 of 36 slices shown (4 of 4)]
[im 1/36]
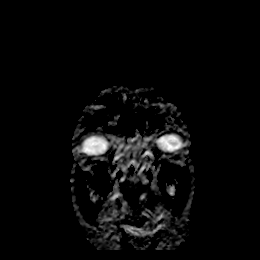
[im 18/36]
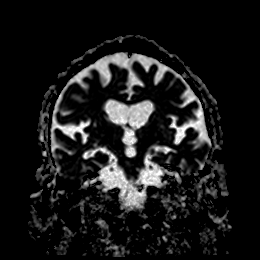
[im 36/36]
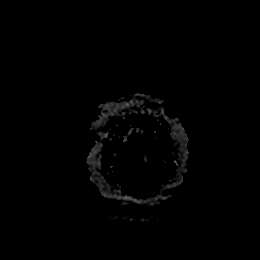

[Series 17: FLAIR · axial · 5.0mm · 0.45mm/px · z∈[-74,+70]mm · 2 of 25 slices shown]
[im 1/25]
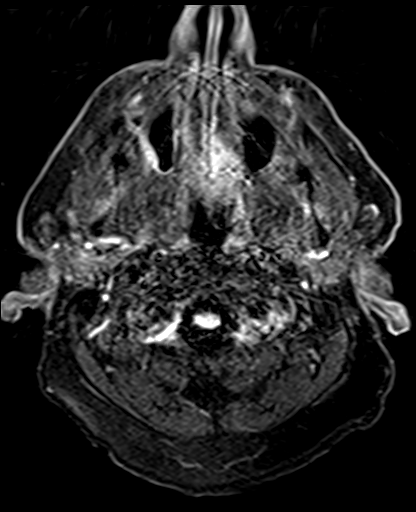
[im 25/25]
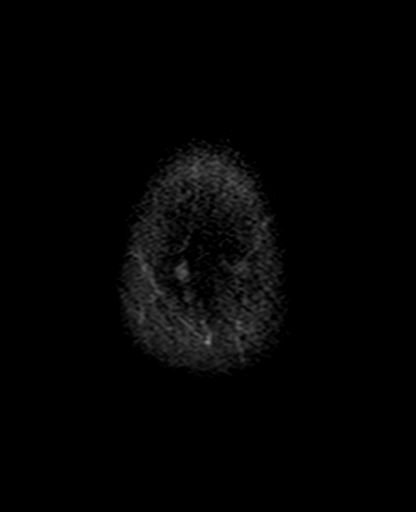

[Series 18: mag_images · axial · 3.0mm · 0.90mm/px · z∈[-90,+86]mm · 4 of 60 slices shown]
[im 1/60]
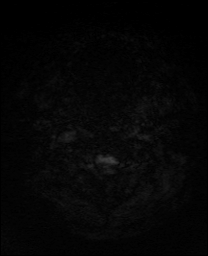
[im 20/60]
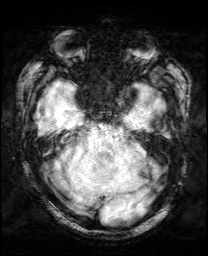
[im 40/60]
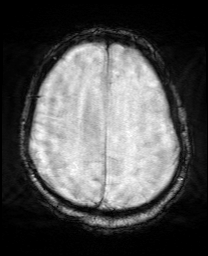
[im 60/60]
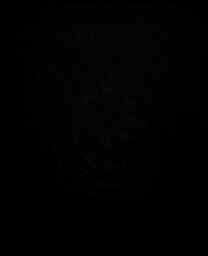

[Series 19: pha_images · axial · 3.0mm · 0.90mm/px · z∈[-87,+77]mm · 4 of 56 slices shown]
[im 1/56]
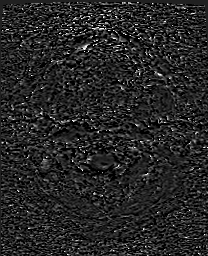
[im 19/56]
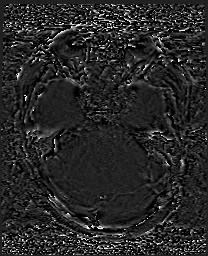
[im 37/56]
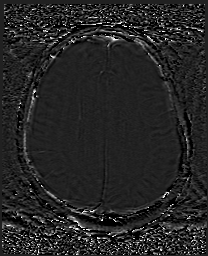
[im 56/56]
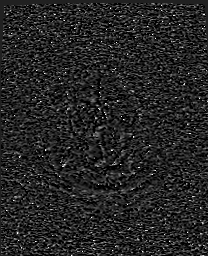

[Series 20: swi_images · axial · 3.0mm · 0.90mm/px · z∈[-90,+86]mm · 4 of 60 slices shown]
[im 1/60]
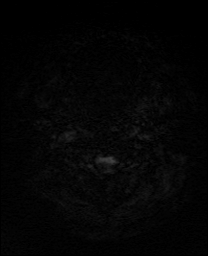
[im 20/60]
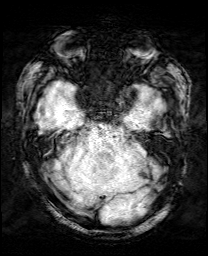
[im 40/60]
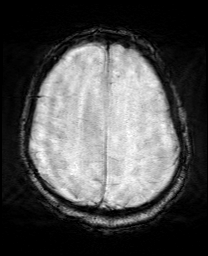
[im 60/60]
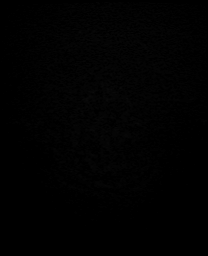

[Series 21: mip_images(sw) · axial · 24.0mm · 0.90mm/px · z∈[-80,+76]mm · 4 of 53 slices shown]
[im 1/53]
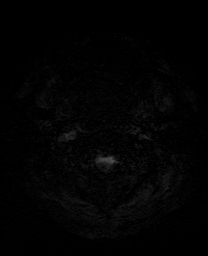
[im 18/53]
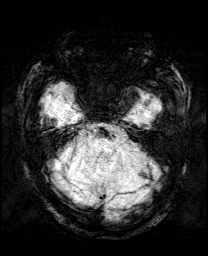
[im 35/53]
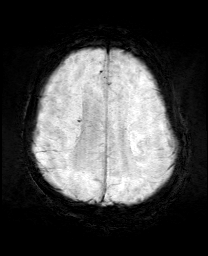
[im 53/53]
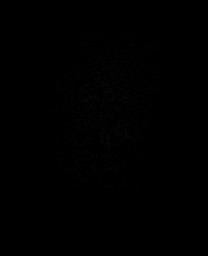

[Series 22: T1 · sagittal · 5.0mm · 0.75mm/px · 2 of 25 slices shown]
[im 1/25]
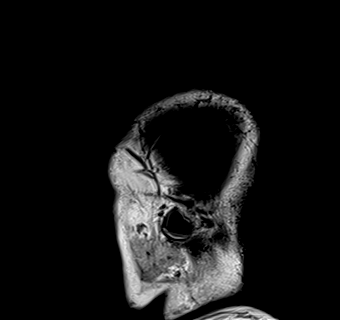
[im 25/25]
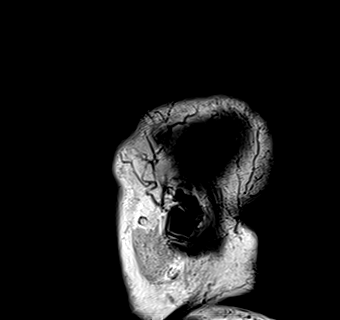

[Series 23: T2 · axial · 5.0mm · 0.72mm/px · z∈[-75,+68]mm · 2 of 25 slices shown (1 of 2)]
[im 1/25]
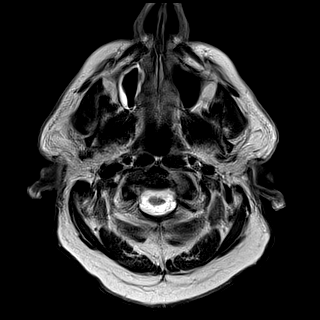
[im 25/25]
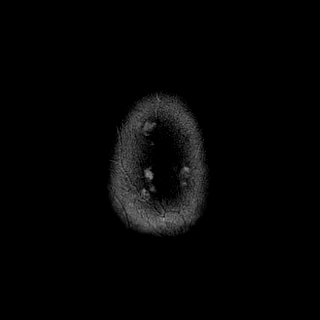

[Series 25: T2 · coronal · 5.0mm · 0.34mm/px · 2 of 31 slices shown (2 of 2)]
[im 1/31]
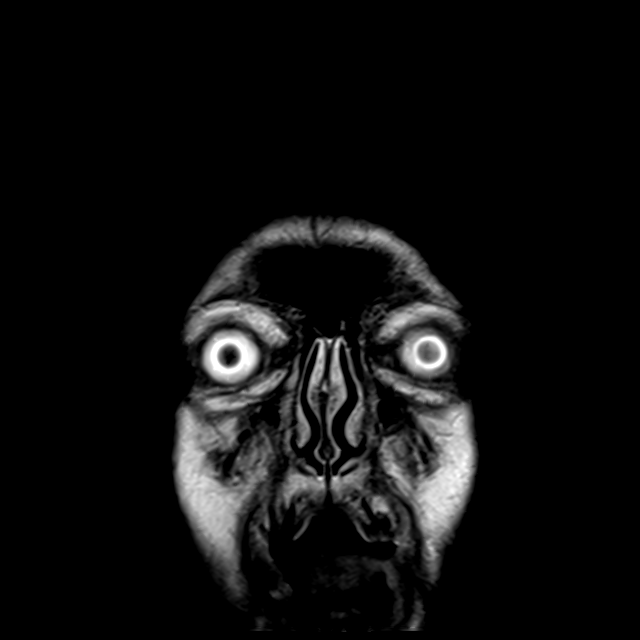
[im 31/31]
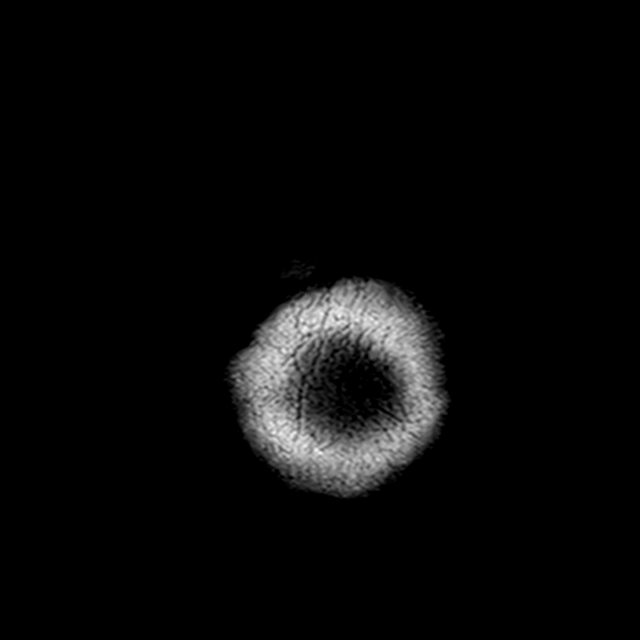

[44 of 48 positions shown; findings below may reference images not displayed]

FINDINGS: Brain: Diffuse prominence of the CSF containing spaces compatible
with generalized age-related cerebral atrophy, moderately advanced
in nature. Patchy and confluent T2/FLAIR hyperintensity involving
the periventricular white matter most consistent with chronic
microvascular ischemic disease, mild in nature. Mild patchy
involvement of the pons noted as well. Few scattered superimposed
remote lacunar infarcts present at the left anterior genu of the
corpus callosum, white matter of the bilateral centrum semi ovale,
thalami, and brainstem. Few additional small remote cerebellar
infarcts noted. Prominent areas of encephalomalacia and gliosis
involving the bilateral occipital lobes consistent with chronic PCA
territory infarcts. Persistent diffusion abnormality seen about
these chronic infarcts, right greater than left, similar to previous
exam, and most likely related to chronic underlying blood products
and/or laminar necrosis.

No abnormal foci of restricted diffusion to suggest acute or
subacute ischemia elsewhere within the brain. Gray-white matter
differentiation otherwise maintained. No acute intracranial
hemorrhage. Few scattered chronic micro hemorrhages noted about the
cerebellum and deep gray nuclei, most likely related to chronic
poorly controlled hypertension, similar to previous.

No mass lesion, midline shift or mass effect. Diffuse ventricular
prominence related to global parenchymal volume loss without
hydrocephalus. No extra-axial fluid collection. Pituitary gland and
suprasellar region normal. Midline structures intact.

Vascular: Major intracranial vascular flow voids are maintained and
stable in appearance. Susceptibility artifact related to left V4
segment stent again noted. Hypoplastic right vertebral artery with
irregular flow void noted as well.

Skull and upper cervical spine: Craniocervical junction within
normal limits. Mild spondylosis noted at C3-4 without significant
stenosis. Remainder the visualized upper cervical spine otherwise
unremarkable. Bone marrow signal intensity within normal limits. No
scalp soft tissue abnormality.

Sinuses/Orbits: Globes and orbital soft tissues demonstrate no acute
finding. Mild scattered mucosal thickening noted within the
ethmoidal air cells and right maxillary sinus. Small bilateral
mastoid effusions noted, of doubtful significance. Visualized
nasopharynx unremarkable. Inner ear structures grossly within normal
limits.

Other: None.
IMPRESSION: 1. No acute intracranial infarct or other abnormality.
2. Chronic bilateral PCA territory infarcts, stable.
3. Underlying age-related cerebral atrophy with chronic
microvascular ischemic disease, with multiple additional remote
lacunar infarcts as above, stable.

## 2020-08-31 MED ORDER — IPRATROPIUM-ALBUTEROL 0.5-2.5 (3) MG/3ML IN SOLN
3.0000 mL | RESPIRATORY_TRACT | Status: DC
  Administered 2020-08-31 (×3): 3 mL via RESPIRATORY_TRACT
  Filled 2020-08-31 (×3): qty 3

## 2020-08-31 MED ORDER — IPRATROPIUM-ALBUTEROL 0.5-2.5 (3) MG/3ML IN SOLN: 3.0000 mL | RESPIRATORY_TRACT | Status: DC | PRN

## 2020-08-31 NOTE — Care Management Important Message (Signed)
Important Message  Patient Details  Name: [name] MRN: [id number] Date of Birth: [birth year]   Medicare Important Message Given:  Yes     Mardene Sayer 08/31/2020, 5:14 PM

## 2020-08-31 NOTE — Therapy (Signed)
Occupational Therapy Evaluation Patient Details Name: [name] MRN: [id number] DOB: 1950-07-15 Today's Date: 08/31/2020    History of Present Illness Pt is a 70yo male who [facility] ED on 7/25 with dizziness and SOB, found to have new atrial fibrillation with RVR. Xray no significant findings. CT abdomen/pelvis found mild pericardial effusion. PMH: multiple CVA, DM, CKD, HTN.   Clinical Impression   Pt admitted as above with extensive PMH impacting his ability to perform ADL's, self care tasks and functional mobility/transfers. Pt is currently Min-Mod A +1 for transfers & LB ADL's but confusion can impact his level of independence at times. Previous CVA's have also impacted cognition and coordination bilaterally but R more than L noted. Pt did not have R posterior and lateral lean as was observed during PT assessment yesterday however. Currently recommend SNF for short term Rehab to assist in maximizing independence w/ ADL's prior to d/c home with supportive spouse/family. Will follow acutely for OT.    Follow Up Recommendations  SNF;Supervision/Assistance - 24 hour;Other (comment) (If Family is not agreeable to SNF, pt will require 24/7 assist, supervision and HHOT)    Equipment Recommendations  Other (comment);3 in 1 bedside commode (Defer to next venue)    Recommendations for Other Services       Precautions / Restrictions Precautions Precautions: Fall Restrictions Weight Bearing Restrictions: No      Mobility Bed Mobility Overal bed mobility: Needs Assistance Bed Mobility: Supine to Sit     Supine to sit: Min assist;HOB elevated     General bed mobility comments: Pt required Min guard +1 supine to sit and Min A to scoot to EOB and place feet on floor/sitting. No hands on assist sitting at EOB. Pt was able to scoot on left side, R side required assist to place both feet on the floor.    Transfers Overall transfer level: Needs assistance Equipment used: Rolling  walker (2 wheeled) Transfers: Sit to/from [employer] Sit to Stand: Min assist;From elevated surface Stand pivot transfers: Min assist (Verbal and tactile cues)       General transfer comment: Pt was overall Min A for sit to stand and steady/power up using UE's/RW. Pt also required vc's/tc;s to place R hand in correct position on RW prior to moving. While right side appears weak, pt was noted w/ only slight  lean to right during transfers and amb in room.    Balance Overall balance assessment: Needs assistance Sitting-balance support: Single extremity supported;Feet supported Sitting balance-Leahy Scale: Fair Sitting balance - Comments: Pt able to maintain balance w/o hands on assist in static sitting at EOB.   Standing balance support: Bilateral upper extremity supported;During functional activity Standing balance-Leahy Scale: Fair       ADL either performed or assessed with clinical judgement   ADL Overall ADL's : Needs assistance/impaired Eating/Feeding: Set up;Sitting   Grooming: Wash/dry hands;Sitting;Supervision/safety;Set up   Upper Body Bathing: Minimal assistance;Sitting;Cueing for safety;Cueing for sequencing   Lower Body Bathing: Moderate assistance;Cueing for safety;Cueing for sequencing;Sitting/lateral leans;Sit to/from stand   Upper Body Dressing : Minimal assistance;Sitting;Cueing for sequencing   Lower Body Dressing: Minimal assistance;Moderate assistance;Sitting/lateral leans;Sit to/from stand;Cueing for sequencing;Cueing for safety   Toilet Transfer: Minimal assistance;Cueing for safety;Cueing for sequencing;Ambulation;BSC;RW (VC/TC's for sequencing as pt appears confused)   Toileting- Clothing Manipulation and Hygiene: Minimal assistance;Sitting/lateral lean;Sit to/from stand       Functional mobility during ADLs: Minimal assistance;Rolling walker;Cueing for safety;Cueing for sequencing (verbal and tactile cues required) General ADL  Comments: Pt is overall Min-Mod assist for ADL/functional transfers using RW. He appears to demonstrate right sided weakness and decreased coordination bilaterally but family did not indicate that this is new for him. He required min vc's and tc's for RUE hand placement on RW during transfer and frequent/consistent redirection to tasks.     Vision Baseline Vision/History: Wears glasses              Pertinent Vitals/Pain Pain Assessment: No/denies pain     Hand Dominance  Right    Extremity/Trunk Assessment Upper Extremity Assessment Upper Extremity Assessment: RUE deficits/detail;Generalized weakness;LUE deficits/detail RUE Coordination: decreased fine motor;decreased gross motor LUE Coordination: decreased fine motor   Lower Extremity Assessment Lower Extremity Assessment: Defer to PT evaluation   Cervical / Trunk Assessment Cervical / Trunk Assessment: Normal   Communication Communication Communication: Expressive difficulties (Difficulty with clear phonation, ?secondary to previous CVA's)   Cognition Arousal/Alertness: Awake/alert Behavior During Therapy: WFL for tasks assessed/performed Overall Cognitive Status: History of cognitive impairments - at baseline       General Comments: Hx of multiple CVAs   General Comments  Pt wife states "Look at that, you are doing more today than yesterday" re:sit to stand and amb in room & transfer to chair to sit up for lunch. Pt was noted to be confused and with difficulty following commands consistantly at times (ie: pt was asked to side step to left in peparation for sitting in chair. Pt instead stepped to right multiple times and began backing up to bed).            Home Living Family/patient expects to be discharged to:: Private residence Living Arrangements: Spouse/significant other Available Help at Discharge: Family;Available 24 hours/day Type of Home: House Home Access: Stairs to enter [employer] of Steps:  5 Entrance Stairs-Rails: Left Home Layout: One level     Bathroom Shower/Tub: Producer, television/film/video: Handicapped height     Home Equipment: Environmental consultant - 2 wheels;Shower seat          Prior Functioning/Environment Level of Independence: Independent                OT Problem List: Decreased activity tolerance;Impaired balance (sitting and/or standing);Decreased coordination;Decreased safety awareness;Decreased knowledge of use of DME or AE;Decreased knowledge of precautions      OT Treatment/Interventions: Self-care/ADL training;Therapeutic activities;Patient/family education;Energy conservation;DME and/or AE instruction    OT Goals(Current goals can be found in the care plan section) Acute Rehab OT Goals Patient Stated Goal: Get better OT Goal Formulation: With patient/family Time For Goal Achievement: 09/21/20 Potential to Achieve Goals: Good ADL Goals Pt Will Perform Grooming: with modified independence;with set-up;sitting Pt Will Perform Lower Body Bathing: with supervision;with min guard assist;sitting/lateral leans;sit to/from stand Pt Will Perform Lower Body Dressing: with supervision;with min guard assist;with caregiver independent in assisting;sitting/lateral leans;sit to/from stand Pt Will Transfer to Toilet: with supervision;ambulating;regular height toilet;bedside commode Pt Will Perform Toileting - Clothing Manipulation and hygiene: with supervision;sitting/lateral leans;sit to/from stand;with caregiver independent in assisting Pt Will Perform Tub/Shower Transfer: Shower transfer;shower seat;ambulating;rolling walker;with caregiver independent in assisting;with min assist;with min guard assist  OT Frequency: Min 2X/week   Barriers to D/C: Other (comment) (Discussed SNF Rehab vs home w/ HHOT & was pt near baseline w/ spouse. Pt spouse stated that pt"will need to be near [facility] if he goes to Rehab" but never stated which she preferred. OT then stated  that OT/PT were recommending short term Rehab at SNF.)  AM-PAC OT "6 Clicks" Daily Activity     Outcome Measure Help from another person eating meals?: A Little Help from another person taking care of personal grooming?: A Little Help from another person toileting, which includes using toliet, bedpan, or urinal?: A Little Help from another person bathing (including washing, rinsing, drying)?: A Lot Help from another person to put on and taking off regular upper body clothing?: A Little Help from another person to put on and taking off regular lower body clothing?: A Lot 6 Click Score: 16   End of Session Equipment Utilized During Treatment: Gait belt;Rolling walker Nurse Communication: Mobility status;Other (comment) (Pt family requesting condom cath. Placed urinal at bedside in meantime. Pt sitting up in chair eating lunch.)  Activity Tolerance: Patient tolerated treatment well Patient left: in chair;with call bell/phone within reach;with family/visitor present  OT Visit Diagnosis: Unsteadiness on feet (R26.81);Other abnormalities of gait and mobility (R26.89);Muscle weakness (generalized) (M62.81);Other symptoms and signs involving the nervous system (R29.898);Other symptoms and signs involving cognitive function                Time: 0539-7673 OT Time Calculation (min): 29 min Charges:  OT General Charges $OT Visit: 1 Visit OT Evaluation $OT Eval Moderate Complexity: 1 Mod OT Treatments $Therapeutic Activity: 8-22 mins  Star Resler Beth Dixon, OTR/L 08/31/2020, 1:12 PM

## 2020-08-31 NOTE — Progress Notes (Signed)
Inpatient Rehabilitation Admissions Coordinator  SNF recommended by OT today. We will not pursue CIR admit.  Ottie Glazier, RN, MSN Rehab Admissions Coordinator [phone] 08/31/2020 1:28 PM

## 2020-08-31 NOTE — Progress Notes (Signed)
  Echocardiogram 2D Echocardiogram has been performed.  Gerda Diss 08/31/2020, 10:59 AM

## 2020-08-31 NOTE — Progress Notes (Signed)
Progress Note  Patient Name: [name] Date of Encounter: 08/31/2020  [facility] Cardiologist: None Darci Needle, III  Subjective   Feels better.  His wife and daughter-in-law are in the room.  Appetite is improving.  He denies chills.  The family is concerned about generalized weakness.  I explained that this would not be typical with a viral syndrome induced acute pericarditis.  Other etiologies might be at play.  Inpatient Medications    Scheduled Meds:  apixaban  5 mg Oral BID   aspirin EC  81 mg Oral Daily   atorvastatin  80 mg Oral q1800   carvedilol  12.5 mg Oral BID WC   colchicine  0.6 mg Oral BID   dapagliflozin propanediol  10 mg Oral Daily   FLUoxetine  10 mg Oral Daily   ipratropium-albuterol  3 mL Nebulization Q4H   multivitamin with minerals  1 tablet Oral Daily   Continuous Infusions:  promethazine (PHENERGAN) injection (IM or IVPB)     PRN Meds: acetaminophen, polyethylene glycol **OR** senna, promethazine (PHENERGAN) injection (IM or IVPB)   Vital Signs    Vitals:   08/30/20 2027 08/30/20 2230 08/31/20 0230 08/31/20 0626  BP: 94/70 99/70 101/71 111/68  Pulse: (!) 113 (!) 110 84 73  Resp: 20 19 18 18   Temp: 100.3 F (37.9 C) 98.9 F (37.2 C) 99.7 F (37.6 C) 98.6 F (37 C)  TempSrc: Oral Oral Oral Oral  SpO2: 95% 96% 96% 96%  Weight:    102.7 kg  Height:        Intake/Output Summary (Last 24 hours) at 08/31/2020 1207 Last data filed at 08/31/2020 0630 Gross per 24 hour  Intake 762.67 ml  Output 650 ml  Net 112.67 ml   Last 3 Weights 08/31/2020 08/30/2020 08/29/2020  Weight (lbs) 226 lb 6.6 oz 225 lb 12 oz 223 lb 8.7 oz  Weight (kg) 102.7 kg 102.4 kg 101.4 kg      Telemetry    Intermittent episodes of atrial fibrillation, short-lived.- Personally Reviewed  ECG    ECG this a.m. demonstrated right bundle with atrial fibs and poor rate control.- Personally Reviewed  Physical Exam  Obese, having limited echo  performed. GEN: Somewhat apathetic/bland affect. Neck: No JVD Cardiac: RRR, no murmurs, pericardial rub, or gallop.  Respiratory: Clear to auscultation bilaterally. GI: Soft, nontender, non-distended  MS: No edema; No deformity. Neuro:  Nonfocal  Psych: Normal affect   Labs    High Sensitivity Troponin:   Recent Labs  Lab 08/27/20 0907 08/27/20 1107  TROPONINIHS 27* 23*      Chemistry Recent Labs  Lab 08/27/20 0907 08/28/20 0443 08/29/20 0603 08/30/20 0836 08/31/20 0650  NA 137   < > 135 136 137  K 3.7   < > 3.8 3.6 4.4  CL 106   < > 107 107 106  CO2 23   < > 19* 25 22  GLUCOSE 136*   < > 117* 165* 128*  BUN 17   < > 24* 24* 28*  CREATININE 1.74*   < > 1.86* 1.62* 1.68*  CALCIUM 8.2*   < > 8.3* 8.2* 8.3*  PROT 6.4*  --   --   --   --   ALBUMIN 2.6*  --   --   --   --   AST 28  --   --   --   --   ALT 22  --   --   --   --  ALKPHOS 107  --   --   --   --   BILITOT 1.1  --   --   --   --   GFRNONAA 42*   < > 39* 46* 44*  ANIONGAP 8   < > 9 4* 9   < > = values in this interval not displayed.     Hematology Recent Labs  Lab 08/29/20 0400 08/30/20 0138 08/31/20 0405  WBC 10.9* 10.6* 8.8  RBC 3.55* 3.61* 3.43*  HGB 11.6* 11.8* 11.0*  HCT 34.0* 34.6* 32.9*  MCV 95.8 95.8 95.9  MCH 32.7 32.7 32.1  MCHC 34.1 34.1 33.4  RDW 14.6 14.8 14.9  PLT 217 243 263    CRP: 23.7  BNP Recent Labs  Lab 08/27/20 0907  BNP 236.8*     DDimer  Recent Labs  Lab 08/27/20 0907  DDIMER 1.82*     Radiology    CT HEAD WO CONTRAST  Result Date: 08/30/2020 CLINICAL DATA:  Altered mental status EXAM: CT HEAD WITHOUT CONTRAST TECHNIQUE: Contiguous axial images were obtained from the base of the skull through the vertex without intravenous contrast. COMPARISON:  08/27/2019 FINDINGS: Brain: Old bilateral occipital infarcts. Appearance is stable. There is atrophy and chronic small vessel disease changes. No acute intracranial abnormality. Specifically, no hemorrhage,  hydrocephalus, mass lesion, acute infarction, or significant intracranial injury. Vascular: No hyperdense vessel or unexpected calcification. Skull: No acute calvarial abnormality. Sinuses/Orbits: No acute findings Other: None IMPRESSION: Remote infarcts in the occipital lobes. Atrophy, chronic microvascular disease. No acute intracranial abnormality. Electronically Signed   By: Charlett Nose M.D.   On: 08/30/2020 16:11    Cardiac Studies   08/31/2020 echocardiogram: Limited echo demonstrates no change in size of small pericardial effusion. Indeterminate VQ scan for PE Cholelithiasis and nephrolithiasis on abdominal CT 08/30/2020 CT scan of the head: No acute abnormality  Patient Profile     70 y.o. 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  Assessment & Plan    Paroxysmal atrial fibrillation: Continued episodes that are short-lived.  Continue anticoagulation therapy  Small circumferential pericardial effusion: No increase in size of pericardial effusion on anticoagulation therapy.  Clinically, there is a pericarditis phenotype.  Etiology suspected to be viral.  With complaints of generalized weakness, may need to consider connective tissue disease.  Check CPK and autoimmune markers.  Polymyalgia rheumatica also comes to mind.  As currently receiving colchicine 0.6 mg twice daily with improvement in symptoms, would continue that dose and not taper until repeat markers in 2 to 3 weeks demonstrate a decline in inflammation (CRP and sed rate). Febrile illness: Not yet characterized.  Suspect related to pericarditis/underlying inflammatory syndrome.   Coronary artery disease: Once more stable, will need an ischemic work-up.  Suspect as an outpatient with a myocardial  perfusion study.   Elevated Chads Vascular score: The patient is on Eliquis.  Agree with thought of repeating echo in 3 to 4 weeks. CKD 3B: Stable.  Trying to avoid using nonsteroidals.     For questions or updates, please contact [facility] Please consult [email or web address] for contact info under        Signed, Lesleigh Noe, MD  08/31/2020, 12:07 PM

## 2020-08-31 NOTE — Progress Notes (Signed)
Subjective:  [name] was sitting up in bed this am. He reports no chest pain or trouble breathing. He reports feeling fine this am.   Objective:  Physical exam: General: well appearing man sitting up in bed HEENT: Normocephalic atraumatic head, No sclera icterus.  CV: Irregular rhythm. No murmurs. Lungs: Normal lung sounds with wheezing noted.  Abdomen: Soft and distended abdomen with no tenderness to palpation: Extremities: No lower extremity edema. MSK: Equal strength of lower extremities.  Skin: No skin changes noted on visible skin.    Vital signs in last 24 hours: Vitals:   08/30/20 2027 08/30/20 2230 08/31/20 0230 08/31/20 0626  BP: 94/70 99/70 101/71 111/68  Pulse: (!) 113 (!) 110 84 73  Resp: '20 19 18 18  ' Temp: 100.3 F (37.9 C) 98.9 F (37.2 C) 99.7 F (37.6 C) 98.6 F (37 C)  TempSrc: Oral Oral Oral Oral  SpO2: 95% 96% 96% 96%  Weight:    102.7 kg  Height:       Weight change: 0.3 kg  Intake/Output Summary (Last 24 hours) at 08/31/2020 1052 Last data filed at 08/31/2020 0630 Gross per 24 hour  Intake 762.67 ml  Output 650 ml  Net 112.67 ml   CBC Latest Ref Rng & Units 08/31/2020 08/30/2020 08/29/2020  WBC 4.0 - 10.5 K/uL 8.8 10.6(H) 10.9(H)  Hemoglobin 13.0 - 17.0 g/dL 11.0(L) 11.8(L) 11.6(L)  Hematocrit 39.0 - 52.0 % 32.9(L) 34.6(L) 34.0(L)  Platelets 150 - 400 K/uL 263 243 217   BMP Latest Ref Rng & Units 08/31/2020 08/30/2020 08/29/2020  Glucose 70 - 99 mg/dL 128(H) 165(H) 117(H)  BUN 8 - 23 mg/dL 28(H) 24(H) 24(H)  Creatinine 0.61 - 1.24 mg/dL 1.68(H) 1.62(H) 1.86(H)  Sodium 135 - 145 mmol/L 137 136 135  Potassium 3.5 - 5.1 mmol/L 4.4 3.6 3.8  Chloride 98 - 111 mmol/L 106 107 107  CO2 22 - 32 mmol/L 22 25 19(L)  Calcium 8.9 - 10.3 mg/dL 8.3(L) 8.2(L) 8.3(L)   ESR: 52->86 CRP: 23.7->26.5  UCX: No growth   CT head WO con (7/28): Brain: Old bilateral occipital infarcts. Appearance is stable. There is atrophy and chronic small vessel disease  changes. No acute intracranial abnormality. Specifically, no hemorrhage, hydrocephalus, mass lesion, acute infarction, or significant intracranial injury. Vascular: No hyperdense vessel or unexpected calcification. Skull: No acute calvarial abnormality. Sinuses/Orbits: No acute findings Other: None  Assessment/Plan:  Principal Problem:   Atrial fibrillation with RVR (HCC) Active Problems:   Dyslipidemia   DM II (diabetes mellitus, type II), controlled (HCC)   Acute-on-chronic kidney injury ([place])   Dyspnea  Paroxysmal atrial fibrillation w/ RVR 2/2 post-viral pericarditis Resolved new oxygen requirement  Hypoxia  Dyspnea  Wheezing Pt with afib 2/2 post-viral pericarditis being treated with colchicine 0.81m bid. Pt continues to be afebrile. Some tachycardia noted early this am however back to goal HR in the afternoon. Echo today to assess for size of pericardial effusion given apixiban.  -f/u echo -Scheduled duonebs for continued wheezing  -cont colchicine 0.6 mg BID and discharge with 3 month course  -cont carvedilol 12.5 mg bid -cont apixaban 57mdaily -daily weight and strict I/Os -consider an echocardiogram outpatient at 1 month to assess pericarditis progression -consider outpatient sleep study  -Cardiology following appreciate recs  -PT/OT consult before discharge   Improving Acute kidney injury: Creatinine stable today (1.62->1.68), nearing baseline. Ucx with no growth. Continuing to monitor.  -bmp tomorrow   New posterior and R. Lateral lean  R. Foot  drag: Hx of CVAx4. Presented with a.fib and new finding of right lean and foot drag concerning for new ischemic even during hospitalization. CT on 7/28 showed no acute intracranial changes. MRI brain pending.  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 Continue Dapagliflozin 10 mg QD.   HLD: Well controlled. On home  atorvastatin.   HTN: Pt continues to have stable BP. Continue carvedilol 12.88m bid.   T2DM: Diet controlled diabetes. Last A1c was 5.9 on 03/29/20.   Gout: Last flare up in 08/2019. Patient receiving colchicine for treatment of pericarditis. Can resume home colchicine after current course.    Diet: heart healthy  VTE ppx: apixaban  Code status: full   Prior to Admission Living Arrangement: Home Anticipated Discharge Location: Home Barriers to Discharge: Clinical improvement   Dispo: Anticipated discharge today or tomorrow, depending on MRI.    LOS: 2 days   GGilford SilviusT, Medical Student 08/31/2020, 10:52 AM

## 2020-08-31 NOTE — Progress Notes (Signed)
Patient had intermittent confusions overnight and attempted to get out of bed. Reoriented and made him comfortable in bed. Will continue to monitor.

## 2020-09-01 DIAGNOSIS — I4891 Unspecified atrial fibrillation: Secondary | ICD-10-CM | POA: Diagnosis not present

## 2020-09-01 DIAGNOSIS — I313 Pericardial effusion (noninflammatory): Secondary | ICD-10-CM | POA: Diagnosis present

## 2020-09-01 DIAGNOSIS — I3139 Other pericardial effusion (noninflammatory): Secondary | ICD-10-CM | POA: Diagnosis present

## 2020-09-01 DIAGNOSIS — I309 Acute pericarditis, unspecified: Secondary | ICD-10-CM

## 2020-09-01 LAB — CBC
HCT: 31.6 % — ABNORMAL LOW (ref 39.0–52.0)
Hemoglobin: 10.8 g/dL — ABNORMAL LOW (ref 13.0–17.0)
MCH: 32.6 pg (ref 26.0–34.0)
MCHC: 34.2 g/dL (ref 30.0–36.0)
MCV: 95.5 fL (ref 80.0–100.0)
Platelets: 279 10*3/uL (ref 150–400)
RBC: 3.31 MIL/uL — ABNORMAL LOW (ref 4.22–5.81)
RDW: 14.8 % (ref 11.5–15.5)
WBC: 8.1 10*3/uL (ref 4.0–10.5)
nRBC: 0 % (ref 0.0–0.2)

## 2020-09-01 LAB — BASIC METABOLIC PANEL
Anion gap: 8 (ref 5–15)
BUN: 25 mg/dL — ABNORMAL HIGH (ref 8–23)
CO2: 21 mmol/L — ABNORMAL LOW (ref 22–32)
Calcium: 8.2 mg/dL — ABNORMAL LOW (ref 8.9–10.3)
Chloride: 108 mmol/L (ref 98–111)
Creatinine, Ser: 1.52 mg/dL — ABNORMAL HIGH (ref 0.61–1.24)
GFR, Estimated: 49 mL/min — ABNORMAL LOW (ref >60)
Glucose, Bld: 136 mg/dL — ABNORMAL HIGH (ref 70–99)
Potassium: 3.5 mmol/L (ref 3.5–5.1)
Sodium: 137 mmol/L (ref 135–145)

## 2020-09-01 LAB — CK: Total CK: 165 U/L (ref 49–397)

## 2020-09-01 LAB — RHEUMATOID FACTOR: Rheumatoid fact SerPl-aCnc: 15.1 IU/mL — ABNORMAL HIGH (ref <14.0)

## 2020-09-01 MED ORDER — PHENOL 1.4 % MT LIQD: 1.0000 | OROMUCOSAL | Status: DC | PRN

## 2020-09-01 NOTE — NC FL2 (Signed)
[place] MEDICAID [id number] LEVEL OF CARE SCREENING TOOL     IDENTIFICATION  Patient Name: [name] Birthdate: 1950-06-05 Sex: male Admission Date (Current Location): 08/27/2020  [facility] and [place] Number:  Producer, television/film/video and Address:  [facility], 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   Altered mental status 08/30/2019   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/27/2019   Palliative care by specialist    Goals of care, counseling/discussion    Stroke (HCC) 08/26/2019   Slurred speech 08/26/2019   Dyslipidemia 08/26/2019   CKD (chronic kidney disease), stage III (HCC) 08/26/2019   DM II (diabetes mellitus, type II), controlled (HCC) 08/26/2019   Cerebrovascular disease 08/26/2019    Orientation RESPIRATION BLADDER Height & Weight     Self, Time, Situation, Place  Normal Incontinent, External catheter Weight: 223 lb 5.2 oz (101.3 kg) Height:  5\' 10"  (177.8 cm)  BEHAVIORAL SYMPTOMS/MOOD NEUROLOGICAL BOWEL NUTRITION STATUS      Continent Diet (See dc summary)  AMBULATORY STATUS COMMUNICATION OF NEEDS Skin   Extensive Assist Verbally Normal                       Personal Care Assistance Level of Assistance  Bathing, Feeding, Dressing Bathing Assistance: Maximum assistance Feeding assistance: Independent Dressing Assistance:  Maximum assistance Total Care Assistance: Limited assistance   Functional Limitations Info  Sight, Hearing, Speech Sight Info: Impaired Hearing Info: Adequate Speech Info: Adequate    SPECIAL CARE FACTORS FREQUENCY  PT (By licensed PT), OT (By licensed OT)     PT Frequency: 5x week OT Frequency: 5x week            Contractures Contractures Info: Not present    Additional Factors Info  Code Status, Allergies Code Status Info: Full Allergies Info: NKA           Current Medications (09/01/2020):  This is the current hospital active medication list Current Facility-Administered Medications  Medication Dose Route Frequency Provider Last Rate Last Admin   acetaminophen (TYLENOL) tablet 650 mg  650 mg Oral Q4H PRN Rehman, Areeg N, DO   650 mg at 09/01/20 0049   apixaban (ELIQUIS) tablet 5 mg  5 mg Oral BID 09/03/20 B, MD   5 mg at 09/01/20 09/03/20   aspirin EC tablet 81 mg  81 mg Oral Daily Rehman, Areeg N, DO   81 mg at 09/01/20 0930   atorvastatin (LIPITOR) tablet 80 mg  80 mg Oral q1800 Rehman, Areeg N, DO   80 mg at 08/31/20 1725   carvedilol (COREG) tablet 12.5 mg  12.5 mg Oral BID WC Rehman, Areeg N, DO   12.5 mg at 09/01/20 0805   colchicine tablet 0.6 mg  0.6 mg Oral BID 09/03/20, MD   0.6 mg at 09/01/20 0930  dapagliflozin propanediol (FARXIGA) tablet 10 mg  10 mg Oral Daily Lyn Records, MD   10 mg at 09/01/20 0930   FLUoxetine (PROZAC) capsule 10 mg  10 mg Oral Daily Rehman, Areeg N, DO   10 mg at 09/01/20 0930   ipratropium-albuterol (DUONEB) 0.5-2.5 (3) MG/3ML nebulizer solution 3 mL  3 mL Nebulization Q4H PRN Inez Catalina, MD       multivitamin with minerals tablet 1 tablet  1 tablet Oral Daily Rehman, Areeg N, DO   1 tablet at 09/01/20 0930   polyethylene glycol (MIRALAX / GLYCOLAX) packet 17 g  17 g Oral Daily PRN Rehman, Areeg N, DO       Or   senna (SENOKOT) tablet 8.6 mg  1 tablet Oral Daily PRN Rehman, Areeg N, DO   8.6 mg at 09/01/20 1105    promethazine (PHENERGAN) 12.5 mg in sodium chloride 0.9 % 50 mL IVPB  12.5 mg Intravenous Q4H PRN Rehman, Areeg N, DO         Discharge Medications: Please see discharge summary for a list of discharge medications.  Relevant Imaging Results:  Relevant Lab Results:   Additional Information SSN [id number]  Carmina Miller, LCSWA

## 2020-09-01 NOTE — Progress Notes (Signed)
   09/01/20 2326  Provider Notification  Provider Name/Title 1st Contact: Rickey Barbara , MD Pager: [phone]  Date Provider Notified 09/01/20  Time Provider Notified 2326  Notification Type Page  Notification Reason Other (Comment)  Test performed and critical result heart rate up to high 150's. Patient is restless at times and moves a lot in the bed. Denies CP or SOB.  Provider response Other (Comment) (waiting for response)

## 2020-09-01 NOTE — Progress Notes (Signed)
Subjective:  Patient sitting up in chair this morning. His son was at bed side. We spoke with them about his treatment plan and discharge plan to SNF. They were agreeable. They shared their preference for a rehab center in [place].   Objective: Physical Exam General: well appearing man sitting up in chair HEENT: Normocephalic head. CV: Irregular rhythm, no murmurs appreciated Lungs. Wearing 3LNC. Normal work of breathing. Wheezing noted.  Abdomen: soft and distended Extremities: No LEE Skin: no new skin changes   Vital signs in last 24 hours: Vitals:   08/31/20 1932 08/31/20 2319 09/01/20 0500 09/01/20 1228  BP:  117/84 107/66 (!) 122/91  Pulse:  77 64 68  Resp:  20 20 19   Temp:  99.3 F (37.4 C) 98 F (36.7 C) 97.7 F (36.5 C)  TempSrc:  Oral Axillary Oral  SpO2: 96% 96% 97% 99%  Weight:   101.3 kg   Height:       Weight change: -1.4 kg  Intake/Output Summary (Last 24 hours) at 09/01/2020 1304 Last data filed at 09/01/2020 1010 Gross per 24 hour  Intake 480 ml  Output 1300 ml  Net -820 ml   CBC Latest Ref Rng & Units 09/01/2020 08/31/2020 08/30/2020  WBC 4.0 - 10.5 K/uL 8.1 8.8 10.6(H)  Hemoglobin 13.0 - 17.0 g/dL 10.8(L) 11.0(L) 11.8(L)  Hematocrit 39.0 - 52.0 % 31.6(L) 32.9(L) 34.6(L)  Platelets 150 - 400 K/uL 279 263 243   BMP Latest Ref Rng & Units 09/01/2020 08/31/2020 08/30/2020  Glucose 70 - 99 mg/dL 09/01/2020) 557(D) 220(U)  BUN 8 - 23 mg/dL 542(H) 06(C) 37(S)  Creatinine 0.61 - 1.24 mg/dL 28(B) 1.51(V) 6.16(W)  Sodium 135 - 145 mmol/L 137 137 136  Potassium 3.5 - 5.1 mmol/L 3.5 4.4 3.6  Chloride 98 - 111 mmol/L 108 106 107  CO2 22 - 32 mmol/L 21(L) 22 25  Calcium 8.9 - 10.3 mg/dL 8.2(L) 8.3(L) 8.2(L)   RF: 15.1 Anti-jo 1 abs: pending CK: 146  MRI Brain (7/30): 1. No acute intracranial infarct or other abnormality. 2. Chronic bilateral PCA territory infarcts, stable. 3. Underlying age-related cerebral atrophy with chronic microvascular ischemic  disease, with multiple additional remote lacunar infarcts as above, stable.  Echo (7/30): 1. Anterolateral and posterolateral hypokinesis. Left ventricular ejection fraction, by estimation, is 40 to 45%. The left ventricle has mildly decreased function. The left ventricle demonstrates regional wall motion abnormalities (see scoring diagram/findings for description).   2. Right ventricular systolic function is normal. The right ventricular size is normal.   3. Left atrial size was mildly dilated.   4. Moderate pericardial effusion measuring 0.5 cm anteriorly and 1.3 cm posteriorly. Unchanged from 08/27/20. Moderate pericardial effusion. The pericardial effusion is circumferential. There is no evidence of cardiac tamponade.   5. The mitral valve is normal in structure. Trivial mitral valve regurgitation. No evidence of mitral stenosis.   6. The aortic valve is tricuspid. Aortic valve regurgitation is not visualized. No aortic stenosis is present.   7. The inferior vena cava is normal in size with greater than 50% respiratory variability, suggesting right atrial pressure of 3 mmHg.   Assessment/Plan:  Principal Problem:   Atrial fibrillation with RVR (HCC) Active Problems:   Dyslipidemia   DM II (diabetes mellitus, type II), controlled (HCC)   Acute-on-chronic kidney injury (HCC)   Dyspnea   Chest pain  69 yoM w/ long pmhx notable for MI, CVA, HLD, T2DM and CKD admitted to the IMT[facility] fwith paroxysmal atrial fibrillation  with RVR 2/2 postviral pericarditis and resolving AKI.  Paroxysmal atrial fibrillation w/ RVR 2/2 post-viral pericarditis Resolved new oxygen requirement  Hypoxia  Dyspnea  Wheezing Pt with afib 2/2 post-viral pericarditis being treated with colchicine 0.6mg  bid. Pt continues to be afebrile. Rate controlled with carvedilol. Echo showed no change in size of pericardial effusion. Will aim to wean O2 as tolerated with SpO2 goal of >90%.  -cont colchicine 0.6 mg BID and discharge  with 3 month course  -cont carvedilol 12.5 mg bid -cont apixaban 5mg  daily -daily weight and strict I/Os -consider an echocardiogram outpatient at 1 month to assess pericarditis progression -consider outpatient sleep study  -Cardiology following appreciate recs    Improving Acute kidney injury: Creatinine stable today (1.62->1.68->1.52), nearing baseline. Ucx with no growth. Continuing to monitor. -bmp tomorrow    New posterior and R. Lateral lean  R. Foot drag Generalized weakness Hx of CVAx4. Presented with a.fib and new finding of right lean and foot drag concerning for new ischemic even during hospitalization. CT on 7/28 showed no acute intracranial changes. MRI brain with no acute intracranial changes. OT on 7/30 noted improvement of right lateral lean. Given patient's concern of generalized weakness, work up for connective tissue disease showed elevated RF and normal CK. Can consider further rheumatologic work up as an outpatient.   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 Continue Dapagliflozin 10 mg QD.  Ischemic cardiomyopathy: Patient has a history of CAD s/p PCI, and HFmrEF from suspected ischemic etiology. Currently on carvedilol and on home Asprin. Started on SGLT2i while inpatient. Cardiology following and patient could benefit from an ischemic work (LHC or NM stress test) up as an outpatient.  -consider outpatient ischemic disease workup    HLD: Well controlled. On home atorvastatin.   HTN: Pt continues to have stable BP. Continue carvedilol 12.5mg  bid.   T2DM: Diet controlled diabetes. Last A1c was 5.9 on 03/29/20.   Gout: Last flare up in 08/2019. Patient receiving colchicine for treatment of pericarditis. Can resume home colchicine after current course.    Diet: heart healthy  VTE ppx: apixaban  Code status: full   Prior to Admission Living Arrangement:  Home Anticipated Discharge Location: Home Barriers to Discharge: Clinical improvement   Dispo: Anticipated discharge after SNF placement.    LOS: 3 days   09/2019 T, Medical Student 09/01/2020, 1:04 PM

## 2020-09-01 NOTE — TOC Initial Note (Signed)
Transition of Care [facility]) - Initial/Assessment Note    Patient Details  Name: [name] MRN: [id number] Date of Birth: [birth year]  Transition of Care [facility]) CM/SW Contact:    [name], LCSWA Phone Number: 09/01/2020, 12:39 PM  Clinical Narrative:                 CSW received consult for possible SNF placement at time of discharge. CSW spoke with patient's wife [name]. [name] reported that she is currently unable to care for patient at their home given patient's current physical needs and fall risk. Patient expressed understanding of PT recommendation and is agreeable to SNF placement at time of discharge. Patient reports preference for SNFs in the [facility] area. CSW discussed insurance authorization process and provided Medicare SNF ratings list. Patient has not received the COVID vaccines. [name] expressed being hopeful for rehab and for husband to feel better soon. No further questions reported at this time.   Skilled Nursing Rehab Facilities-   [email or web address] *Ratings updated quarterly   Ratings out of 5 possible   Name Address  Phone # Quality Care Staffing Health Inspection Overall  [facility] [address], [place] [phone] 5  4 4   [facility]  [address], [place] [phone] 4 2 4 4   [facility] [address] Center, [address] [place] 2 3 1 1   [facility] [address] 3 3 4 4   [facility] [address] [phone] 3  2 2   [facility] [phone] [address] [phone] 2 2 4 4   [facility] [address] [place] 5 2 2 3   [facility] [address], [facility] [place] 4 2 2 2   [facility] [address], [facility] 5 2 2 3   [facility] [phone], [phone] 5 1 2 2   [facility] [address] [facility] [phone] 5  2 2 3   [facility] [address] [facility], [place] [phone] 3 1 1 1            Patient Goals and CMS Choice        Expected Discharge Plan and Services                                                Prior Living Arrangements/Services                       Activities of Daily Living Home Assistive Devices/Equipment: None ADL Screening (condition at time of admission) Patient's cognitive ability adequate to safely complete daily activities?: Yes Is the patient deaf or have difficulty hearing?: No Does the patient have difficulty seeing, even when wearing glasses/contacts?: Yes Does the patient have difficulty concentrating, remembering, or making decisions?: No Patient able to express need for assistance with ADLs?: Yes Does the patient have difficulty dressing or bathing?: No Independently performs ADLs?: Yes (appropriate for developmental age) Does the patient have difficulty walking or climbing stairs?: Yes Weakness of Legs: None Weakness of Arms/Hands: None  Permission Sought/Granted                  Emotional Assessment              Admission diagnosis:  Atrial fibrillation with RVR (HCC) [I48.91]  Dyspnea, unspecified type [R06.00] Chest pain, unspecified type [R07.9] Patient Active Problem List   Diagnosis Date Noted   Chest pain    Acute-on-chronic kidney injury (HCC) 08/28/2020   Dyspnea    Atrial fibrillation with RVR (HCC) 08/27/2020   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   Altered mental status 08/30/2019   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/27/2019   Palliative care by specialist    Goals of care, counseling/discussion    Stroke (HCC) 08/26/2019   Slurred speech 08/26/2019   Dyslipidemia 08/26/2019   CKD (chronic kidney disease), stage III (HCC) 08/26/2019   DM II (diabetes mellitus, type II), controlled (HCC) 08/26/2019   Cerebrovascular disease 08/26/2019    PCP:  Raymond Basset, PA-C Pharmacy:   [facility] [id number] - [place] - [address] AT [facility] OF [address] [place] [address] Phone: [phone] Fax: [phone]     Social Determinants of Health (SDOH) Interventions    Readmission Risk Interventions No flowsheet data found.

## 2020-09-01 NOTE — Progress Notes (Signed)
Progress Note  Patient Name: [name] Date of Encounter: 09/01/2020  Primary Cardiologist: None  New to Dr. Katrinka Blazing Former [facility] (echo in 2021, distant f/u)  Subjective   Notes pain with his foley prior.  No further CP.  Notes no sx with in AF this AM.  Inpatient Medications    Scheduled Meds:  apixaban  5 mg Oral BID   aspirin EC  81 mg Oral Daily   atorvastatin  80 mg Oral q1800   carvedilol  12.5 mg Oral BID WC   colchicine  0.6 mg Oral BID   dapagliflozin propanediol  10 mg Oral Daily   FLUoxetine  10 mg Oral Daily   multivitamin with minerals  1 tablet Oral Daily   Continuous Infusions:  promethazine (PHENERGAN) injection (IM or IVPB)     PRN Meds: acetaminophen, ipratropium-albuterol, polyethylene glycol **OR** senna, promethazine (PHENERGAN) injection (IM or IVPB)   Vital Signs    Vitals:   08/31/20 1810 08/31/20 1932 08/31/20 2319 09/01/20 0500  BP: (!) 163/70  117/84 107/66  Pulse: 80  77 64  Resp: 16  20 20   Temp: 98.3 F (36.8 C)  99.3 F (37.4 C) 98 F (36.7 C)  TempSrc: Oral  Oral Axillary  SpO2: 95% 96% 96% 97%  Weight:    101.3 kg  Height:        Intake/Output Summary (Last 24 hours) at 09/01/2020 1107 Last data filed at 09/01/2020 1010 Gross per 24 hour  Intake 480 ml  Output 1300 ml  Net -820 ml   Filed Weights   08/30/20 0429 08/31/20 0626 09/01/20 0500  Weight: 102.4 kg 102.7 kg 101.3 kg    Telemetry    AF rvr this AM, short run of SVT, otherwise SR - Personally Reviewed  ECG    SR 78 RBBB no PR changes 08/31/20 - Personally Reviewed  Physical Exam   GEN: No acute distress.   Neck: No JVD Cardiac: RRR, no murmurs, or gallops; subtle friction rubs Respiratory: Clear to auscultation bilaterally. GI: Soft, nontender, non-distended  MS: No edema; No deformity. Neuro:  Nonfocal  Psych: Normal affect   Labs    Chemistry Recent Labs  Lab 08/27/20 [id number] 08/28/20 0443 08/30/20 0836 08/31/20 0650 09/01/20 0332  NA 137    < > 136 137 137  K 3.7   < > 3.6 4.4 3.5  CL 106   < > 107 106 108  CO2 23   < > 25 22 21*  GLUCOSE 136*   < > 165* 128* 136*  BUN 17   < > 24* 28* 25*  CREATININE 1.74*   < > 1.62* 1.68* 1.52*  CALCIUM 8.2*   < > 8.2* 8.3* 8.2*  PROT 6.4*  --   --   --   --   ALBUMIN 2.6*  --   --   --   --   AST 28  --   --   --   --   ALT 22  --   --   --   --   ALKPHOS 107  --   --   --   --   BILITOT 1.1  --   --   --   --   GFRNONAA 42*   < > 46* 44* 49*  ANIONGAP 8   < > 4* 9 8   < > = values in this interval not displayed.     Hematology Recent Labs  Lab 08/30/20 [phone]  08/31/20 0405 09/01/20 0332  WBC 10.6* 8.8 8.1  RBC 3.61* 3.43* 3.31*  HGB 11.8* 11.0* 10.8*  HCT 34.6* 32.9* 31.6*  MCV 95.8 95.9 95.5  MCH 32.7 32.1 32.6  MCHC 34.1 33.4 34.2  RDW 14.8 14.9 14.8  PLT 243 263 279    Cardiac EnzymesNo results for input(s): TROPONINI in the last 168 hours. No results for input(s): TROPIPOC in the last 168 hours.   BNP Recent Labs  Lab 08/27/20 0907  BNP 236.8*     DDimer  Recent Labs  Lab 08/27/20 0907  DDIMER 1.82*     Radiology    CT HEAD WO CONTRAST  Result Date: 08/30/2020 CLINICAL DATA:  Altered mental status EXAM: CT HEAD WITHOUT CONTRAST TECHNIQUE: Contiguous axial images were obtained from the base of the skull through the vertex without intravenous contrast. COMPARISON:  08/27/2019 FINDINGS: Brain: Old bilateral occipital infarcts. Appearance is stable. There is atrophy and chronic small vessel disease changes. No acute intracranial abnormality. Specifically, no hemorrhage, hydrocephalus, mass lesion, acute infarction, or significant intracranial injury. Vascular: No hyperdense vessel or unexpected calcification. Skull: No acute calvarial abnormality. Sinuses/Orbits: No acute findings Other: None IMPRESSION: Remote infarcts in the occipital lobes. Atrophy, chronic microvascular disease. No acute intracranial abnormality. Electronically Signed   By: Charlett Nose  M.D.   On: 08/30/2020 16:11   MR BRAIN WO CONTRAST  Result Date: 09/01/2020 CLINICAL DATA:  Initial evaluation for neuro deficit, stroke suspected. EXAM: MRI HEAD WITHOUT CONTRAST TECHNIQUE: Multiplanar, multiecho pulse sequences of the brain and surrounding structures were obtained without intravenous contrast. COMPARISON:  Comparison made with prior CT from 08/30/2020 as well as previous MRI from 01/26/2020. FINDINGS: Brain: Diffuse prominence of the CSF containing spaces compatible with generalized age-related cerebral atrophy, moderately advanced in nature. Patchy and confluent T2/FLAIR hyperintensity involving the periventricular white matter most consistent with chronic microvascular ischemic disease, mild in nature. Mild patchy involvement of the pons noted as well. Few scattered superimposed remote lacunar infarcts present at the left anterior genu of the corpus callosum, white matter of the bilateral centrum semi ovale, thalami, and brainstem. Few additional small remote cerebellar infarcts noted. Prominent areas of encephalomalacia and gliosis involving the bilateral occipital lobes consistent with chronic PCA territory infarcts. Persistent diffusion abnormality seen about these chronic infarcts, right greater than left, similar to previous exam, and most likely related to chronic underlying blood products and/or laminar necrosis. No abnormal foci of restricted diffusion to suggest acute or subacute ischemia elsewhere within the brain. Gray-white matter differentiation otherwise maintained. No acute intracranial hemorrhage. Few scattered chronic micro hemorrhages noted about the cerebellum and deep gray nuclei, most likely related to chronic poorly controlled hypertension, similar to previous. No mass lesion, midline shift or mass effect. Diffuse ventricular prominence related to global parenchymal volume loss without hydrocephalus. No extra-axial fluid collection. Pituitary gland and suprasellar  region normal. Midline structures intact. Vascular: Major intracranial vascular flow voids are maintained and stable in appearance. Susceptibility artifact related to left V4 segment stent again noted. Hypoplastic right vertebral artery with irregular flow void noted as well. Skull and upper cervical spine: Craniocervical junction within normal limits. Mild spondylosis noted at C3-4 without significant stenosis. Remainder the visualized upper cervical spine otherwise unremarkable. Bone marrow signal intensity within normal limits. No scalp soft tissue abnormality. Sinuses/Orbits: Globes and orbital soft tissues demonstrate no acute finding. Mild scattered mucosal thickening noted within the ethmoidal air cells and right maxillary sinus. Small bilateral mastoid effusions noted, of doubtful significance. Visualized  nasopharynx unremarkable. Inner ear structures grossly within normal limits. Other: None. IMPRESSION: 1. No acute intracranial infarct or other abnormality. 2. Chronic bilateral PCA territory infarcts, stable. 3. Underlying age-related cerebral atrophy with chronic microvascular ischemic disease, with multiple additional remote lacunar infarcts as above, stable. Electronically Signed   By: Rise Mu M.D.   On: 09/01/2020 00:59   ECHOCARDIOGRAM LIMITED  Result Date: 08/31/2020    ECHOCARDIOGRAM LIMITED REPORT   Patient Name:   [name] Date of Exam: 08/31/2020 Medical Rec #:  [id number]    Height:       70.0 in Accession #:    [id number]   Weight:       226.4 lb Date of Birth:  [birth year]    BSA:          2.200 m Patient Age:    69 years     BP:           111/68 mmHg Patient Gender: M            HR:           73 bpm. Exam Location:  Inpatient Procedure: Limited Echo, Cardiac Doppler and Color Doppler Indications:    Pericardial effusion  History:        Patient has prior history of Echocardiogram examinations.                 Previous Myocardial Infarction, Arrythmias:Atrial Fibrillation;                  Risk Factors:Dyslipidemia and Hypertension. Stroke.  Sonographer:    Ross Ludwig RDCS (AE) Referring Phys: [phone] [facility]  Sonographer Comments: Dr. Verdis Prime bedside during echo. IMPRESSIONS  1. Anterolateral and posterolateral hypokinesis. Left ventricular ejection fraction, by estimation, is 40 to 45%. The left ventricle has mildly decreased function. The left ventricle demonstrates regional wall motion abnormalities (see scoring diagram/findings for description).  2. Right ventricular systolic function is normal. The right ventricular size is normal.  3. Left atrial size was mildly dilated.  4. Moderate pericardial effusion measuring 0.5 cm anteriorly and 1.3 cm posteriorly. Unchanged from 08/27/20. Moderate pericardial effusion. The pericardial effusion is circumferential. There is no evidence of cardiac tamponade.  5. The mitral valve is normal in structure. Trivial mitral valve regurgitation. No evidence of mitral stenosis.  6. The aortic valve is tricuspid. Aortic valve regurgitation is not visualized. No aortic stenosis is present.  7. The inferior vena cava is normal in size with greater than 50% respiratory variability, suggesting right atrial pressure of 3 mmHg. FINDINGS  Left Ventricle: Anterolateral and posterolateral hypokinesis. Left ventricular ejection fraction, by estimation, is 40 to 45%. The left ventricle has mildly decreased function. The left ventricle demonstrates regional wall motion abnormalities. Right Ventricle: The right ventricular size is normal. No increase in right ventricular wall thickness. Right ventricular systolic function is normal. Left Atrium: Left atrial size was mildly dilated. Right Atrium: Right atrial size was normal in size. Pericardium: Moderate pericardial effusion measuring 0.5 cm anteriorly and 1.3 cm posteriorly. Unchanged from 08/27/20. A moderately sized pericardial effusion is present. The pericardial effusion is circumferential. There is  excessive respiratory variation in the tricuspid valve spectral Doppler velocities. There is no evidence of cardiac tamponade. Mitral Valve: The mitral valve is normal in structure. Mild mitral annular calcification. Trivial mitral valve regurgitation. No evidence of mitral valve stenosis. Tricuspid Valve: The tricuspid valve is grossly normal. Aortic Valve: The aortic valve is tricuspid. Aortic valve regurgitation  is not visualized. No aortic stenosis is present. Pulmonic Valve: The pulmonic valve was grossly normal. Pulmonic valve regurgitation is trivial. Venous: The inferior vena cava is normal in size with greater than 50% respiratory variability, suggesting right atrial pressure of 3 mmHg. Chilton Siiffany [place] MD Electronically signed by Chilton Siiffany [place] MD Signature Date/Time: 08/31/2020/4:59:41 PM    Final     Patient Profile     70 y.o. male Mi s/p PCI in 1990, Ischemic cardiomyopathy with anterolateral WMA, HTN and DM, Prior CVA with basilar artery stenting and HLD who presented with CP and new AF  Assessment & Plan    Pericardial effusion and Pericarditis FUO- none in last 24 hours CKD Stage 3b - on colchicine 0.6 mg BID with improvement in sx - if CP resumes options for treatment include ibuprofen 600 mg TID with early kidney follow up or ASA 625 mg ( with eliquis) and early CBC follow up for bleeding - slight elevating in RA and elevation in Sed Rate; consider Rheum f/u if no clear etiology can be identified  New PAF CHASDVASC 7  - on eliquis and coreg  Ischemic Cardiomyopathy CAD with prior PCI Hx of CVD and HLD HTN and DM - NYHA class I, Stage A, euvolemic, etiology from ischemia suspected - continue coreg 12.5 mg PO BID; defer ARNI/ARB/ACEi/MRA given potential need to add NSAIDs - continue [place] - outpatient will need evaluation for return of ischemic disease: LHC vs NM Stress based on sx and kidney fx  Discussed with patient and family  For questions or updates, please  contact [facility] Please consult [email or web address] for contact info under Cardiology/STEMI.      Signed, Christell ConstantMahesh A Kennia Vanvorst, MD  09/01/2020, 11:07 AM

## 2020-09-01 NOTE — Progress Notes (Signed)
Occupational Therapy Treatment Patient Details Name: [name] MRN: [id number] DOB: 08-Nov-1950 Today's Date: 09/01/2020    History of present illness Pt is a 70yo male who presents to [facility] ED on 7/25 with dizziness and SOB, found to have new atrial fibrillation with RVR. Xray no significant findings. CT abdomen/pelvis found mild pericardial effusion. PMH: multiple CVA, DM, CKD, HTN.   OT comments  Patient met seated EOB upon entry. Son present at bedside. Patient/family education on pushing call bell and awaiting assistance from nursing staff and presence of condom cath. Patient/family expressed verbal understanding. Session with focus on self-care re-education, functional mobility with use of RW and therapeutic exercise with patient tolerating 1 set x10 reps each of BUE/BLE HEP. Son present at bedside confirms desire for patient to d/c to short-term rehab prior to return home. OT will continue to follow acutely.    Follow Up Recommendations  SNF    Equipment Recommendations  Other (comment);3 in 1 bedside commode (Defer to next venue)    Recommendations for Other Services      Precautions / Restrictions Precautions Precautions: Fall Restrictions Weight Bearing Restrictions: No       Mobility Bed Mobility Overal bed mobility: Needs Assistance             General bed mobility comments: Patient seated EOB upon entry attempting to exit bed to use bathroom despite presece of condom cath.    Transfers Overall transfer level: Needs assistance Equipment used: Rolling walker (2 wheeled) Transfers: Sit to/from [facility] Sit to Stand: From elevated surface;Min guard;Min assist Stand pivot transfers: Min guard;Min assist (Verbal and tactile cues)       General transfer comment: Able to stand from EOB and from low recliner with Min guard and cues for hand placement.    Balance Overall balance assessment: Needs assistance Sitting-balance support: Single  extremity supported;Feet supported Sitting balance-Leahy Scale: Fair Sitting balance - Comments: Pt able to maintain balance w/o hands on assist in static sitting at EOB.   Standing balance support: Bilateral upper extremity supported;During functional activity Standing balance-Leahy Scale: Fair Standing balance comment: Reliant on BUE on RW.                           ADL either performed or assessed with clinical judgement   ADL Overall ADL's : Needs assistance/impaired                         Toilet Transfer: Minimal assistance;Cueing for safety;Cueing for sequencing;Ambulation;BSC;RW Toilet Transfer Details (indicate cue type and reason): Simulated with transfer to RW.           General ADL Comments: Patient continues to require Min A to Mod A grossly for ADLs.     Vision       Perception     Praxis      Cognition Arousal/Alertness: Awake/alert Behavior During Therapy: WFL for tasks assessed/performed Overall Cognitive Status: History of cognitive impairments - at baseline                                 General Comments: Hx of multiple CVAs; patient attempting to exit bed to void bladder in bathroom despite presence of condom cath requiring re-education.        Exercises Exercises: General Upper Extremity;General Lower Extremity General Exercises - Upper Extremity Shoulder Flexion: Strengthening;Both;10 reps;Seated Elbow  Flexion: Strengthening;Both;10 reps;Seated Elbow Extension: Strengthening;Both;10 reps;Seated General Exercises - Lower Extremity Quad Sets: Both;10 reps;Seated   Shoulder Instructions       General Comments Son present at bedside.    Pertinent Vitals/ Pain       Pain Assessment: Faces Faces Pain Scale: Hurts a little bit Pain Location: Generalized with movement Pain Descriptors / Indicators: Grimacing Pain Intervention(s): Limited activity within patient's tolerance;Monitored during  session;Repositioned  Home Living                                          Prior Functioning/Environment              Frequency  Min 2X/week        Progress Toward Goals  OT Goals(current goals can now be found in the care plan section)  Progress towards OT goals: Progressing toward goals  Acute Rehab OT Goals Patient Stated Goal: Get better OT Goal Formulation: With patient/family Time For Goal Achievement: 09/21/20 Potential to Achieve Goals: Good ADL Goals Pt Will Perform Grooming: with modified independence;with set-up;sitting Pt Will Perform Lower Body Bathing: with supervision;with min guard assist;sitting/lateral leans;sit to/from stand Pt Will Perform Lower Body Dressing: with supervision;with min guard assist;with caregiver independent in assisting;sitting/lateral leans;sit to/from stand Pt Will Transfer to Toilet: with supervision;ambulating;regular height toilet;bedside commode Pt Will Perform Toileting - Clothing Manipulation and hygiene: with supervision;sitting/lateral leans;sit to/from stand;with caregiver independent in assisting Pt Will Perform Tub/Shower Transfer: Shower transfer;shower seat;ambulating;rolling walker;with caregiver independent in assisting;with min assist;with min guard assist  Plan Discharge plan remains appropriate;Frequency remains appropriate    Co-evaluation                 AM-PAC OT "6 Clicks" Daily Activity     Outcome Measure   Help from another person eating meals?: A Little Help from another person taking care of personal grooming?: A Little Help from another person toileting, which includes using toliet, bedpan, or urinal?: A Little Help from another person bathing (including washing, rinsing, drying)?: A Lot Help from another person to put on and taking off regular upper body clothing?: A Little Help from another person to put on and taking off regular lower body clothing?: A Lot 6 Click Score:  16    End of Session Equipment Utilized During Treatment: Gait belt;Rolling walker  OT Visit Diagnosis: Unsteadiness on feet (R26.81);Other abnormalities of gait and mobility (R26.89);Muscle weakness (generalized) (M62.81);Other symptoms and signs involving the nervous system (R29.898);Other symptoms and signs involving cognitive function   Activity Tolerance Patient tolerated treatment well   Patient Left in chair;with call bell/phone within reach;with family/visitor present   Nurse Communication Mobility status;Other (comment) (Response to treatment; need for breathing treatment)        Time: 1093-2355 OT Time Calculation (min): 24 min  Charges: OT General Charges $OT Visit: 1 Visit OT Treatments $Self Care/Home Management : 8-22 mins $Therapeutic Activity: 8-22 mins  Kenetha Cozza H. OTR/L Supplemental OT, Department of rehab services [phone]   Denica Web R H. 09/01/2020, 8:53 AM

## 2020-09-02 ENCOUNTER — Inpatient Hospital Stay (HOSPITAL_COMMUNITY): Payer: Medicare HMO

## 2020-09-02 DIAGNOSIS — I309 Acute pericarditis, unspecified: Secondary | ICD-10-CM | POA: Diagnosis not present

## 2020-09-02 DIAGNOSIS — I4891 Unspecified atrial fibrillation: Secondary | ICD-10-CM | POA: Diagnosis not present

## 2020-09-02 LAB — BASIC METABOLIC PANEL
Anion gap: 8 (ref 5–15)
BUN: 24 mg/dL — ABNORMAL HIGH (ref 8–23)
CO2: 20 mmol/L — ABNORMAL LOW (ref 22–32)
Calcium: 8.2 mg/dL — ABNORMAL LOW (ref 8.9–10.3)
Chloride: 110 mmol/L (ref 98–111)
Creatinine, Ser: 1.48 mg/dL — ABNORMAL HIGH (ref 0.61–1.24)
GFR, Estimated: 51 mL/min — ABNORMAL LOW (ref >60)
Glucose, Bld: 119 mg/dL — ABNORMAL HIGH (ref 70–99)
Potassium: 4 mmol/L (ref 3.5–5.1)
Sodium: 138 mmol/L (ref 135–145)

## 2020-09-02 LAB — CULTURE, BLOOD (ROUTINE X 2)
Culture: NO GROWTH
Culture: NO GROWTH
Special Requests: ADEQUATE
Special Requests: ADEQUATE

## 2020-09-02 LAB — CBC
HCT: 32.5 % — ABNORMAL LOW (ref 39.0–52.0)
Hemoglobin: 11 g/dL — ABNORMAL LOW (ref 13.0–17.0)
MCH: 32.3 pg (ref 26.0–34.0)
MCHC: 33.8 g/dL (ref 30.0–36.0)
MCV: 95.3 fL (ref 80.0–100.0)
Platelets: 311 10*3/uL (ref 150–400)
RBC: 3.41 MIL/uL — ABNORMAL LOW (ref 4.22–5.81)
RDW: 15 % (ref 11.5–15.5)
WBC: 9.7 10*3/uL (ref 4.0–10.5)
nRBC: 0 % (ref 0.0–0.2)

## 2020-09-02 IMAGING — DX DG CHEST 1V
1 series · 1 of 1 positions shown · non-contrast
Comparison: [DATE]

CLINICAL DATA: Shortness of breath

EXAM:
CHEST  1 VIEW

[chest]
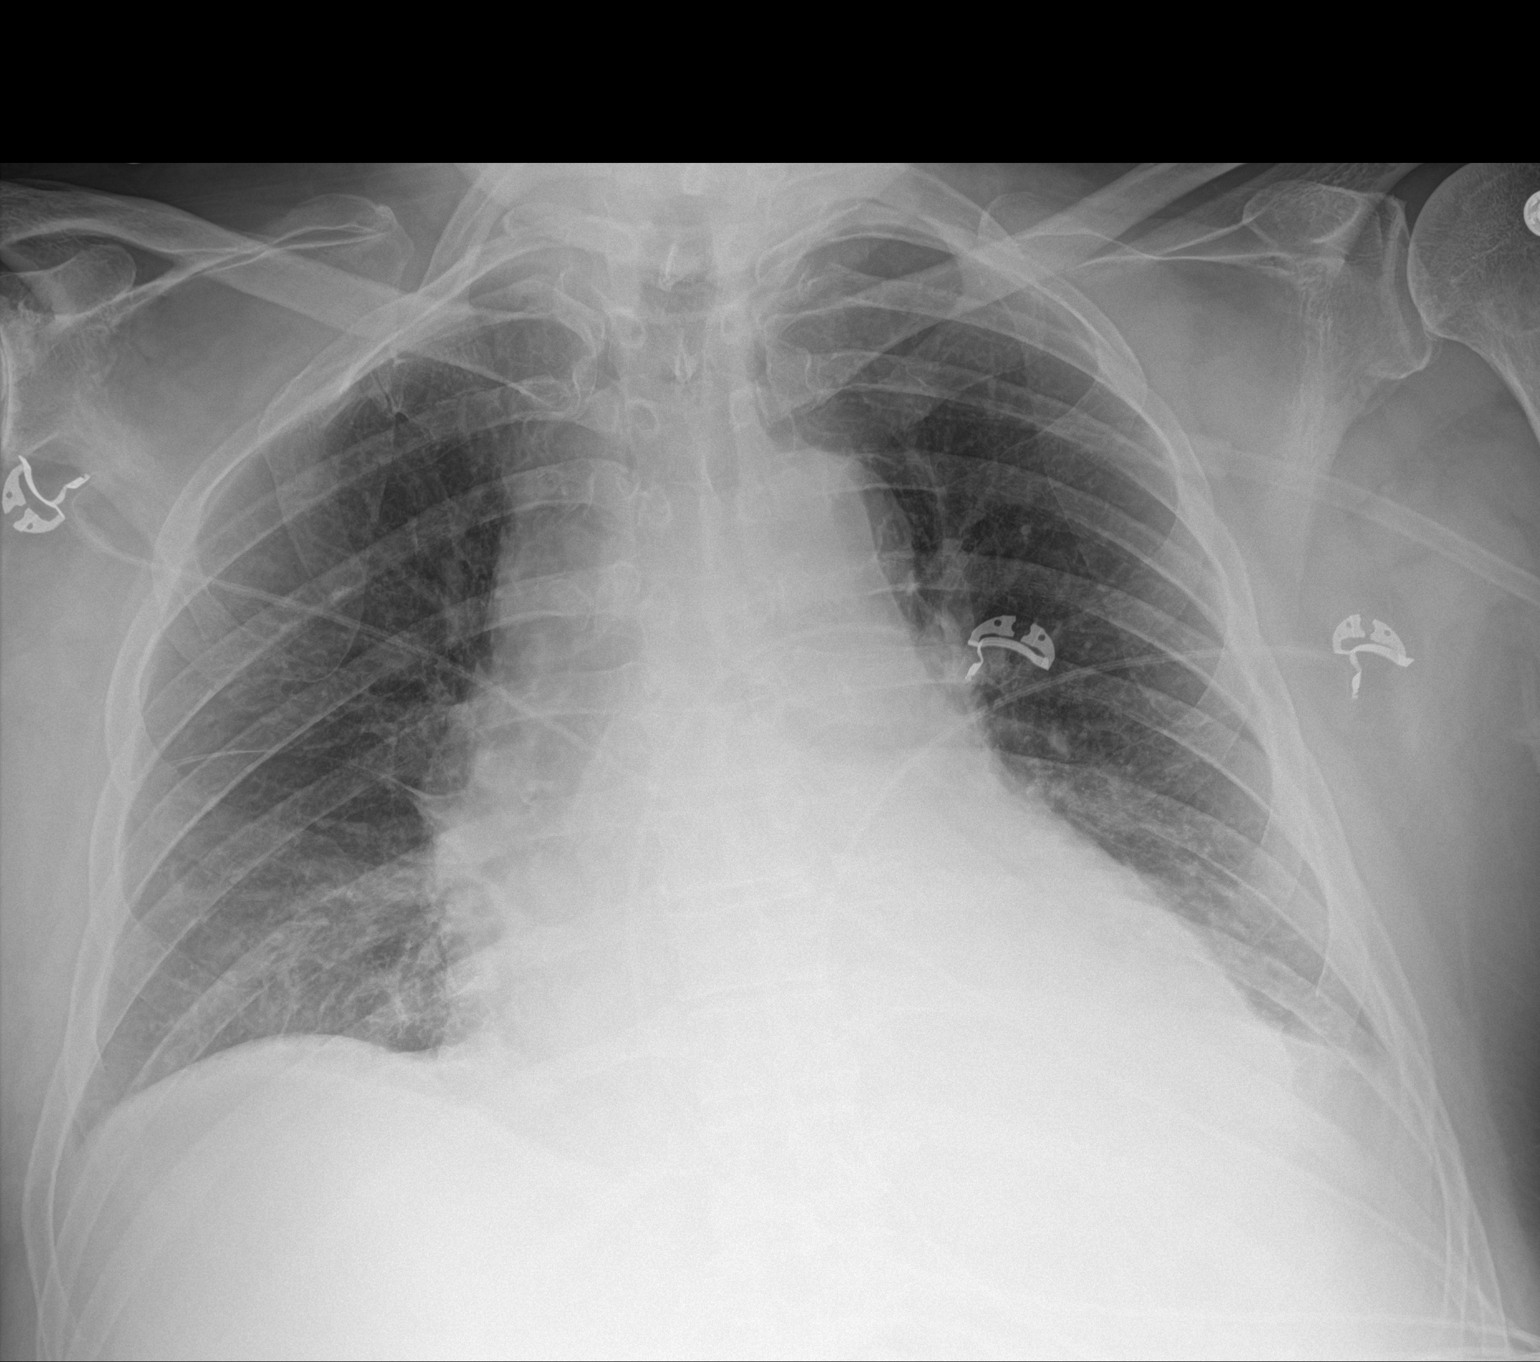

[1 of 1 positions shown; findings below may reference images not displayed]

FINDINGS: Midline trachea. Cardiomegaly accentuated by AP portable technique.
No pleural effusion or pneumothorax. Suspect developing bibasilar
airspace disease. Upper lungs clear.
IMPRESSION: Worsening bibasilar aeration, with suspicion of developing
atelectasis versus infection.

Cardiomegaly without congestive failure.

## 2020-09-02 MED ORDER — CARVEDILOL 25 MG PO TABS
25.0000 mg | ORAL_TABLET | Freq: Once | ORAL | Status: AC
  Administered 2020-09-02: 25 mg via ORAL
  Filled 2020-09-02: qty 1

## 2020-09-02 MED ORDER — FUROSEMIDE 10 MG/ML IJ SOLN
40.0000 mg | Freq: Every day | INTRAMUSCULAR | Status: DC
  Administered 2020-09-02: 40 mg via INTRAVENOUS
  Filled 2020-09-02 (×2): qty 4

## 2020-09-02 MED ORDER — CARVEDILOL 25 MG PO TABS: 25.0000 mg | ORAL_TABLET | Freq: Two times a day (BID) | ORAL | Status: DC

## 2020-09-02 MED ORDER — FUROSEMIDE 10 MG/ML IJ SOLN
40.0000 mg | Freq: Once | INTRAMUSCULAR | Status: AC
  Administered 2020-09-02: 40 mg via INTRAVENOUS
  Filled 2020-09-02: qty 4

## 2020-09-02 MED ORDER — CARVEDILOL 25 MG PO TABS
25.0000 mg | ORAL_TABLET | Freq: Two times a day (BID) | ORAL | Status: DC
  Administered 2020-09-02 – 2020-09-03 (×2): 25 mg via ORAL
  Filled 2020-09-02 (×2): qty 1

## 2020-09-02 MED ORDER — CARVEDILOL 12.5 MG PO TABS
12.5000 mg | ORAL_TABLET | Freq: Once | ORAL | Status: AC
  Administered 2020-09-02: 12.5 mg via ORAL
  Filled 2020-09-02: qty 1

## 2020-09-02 NOTE — Progress Notes (Addendum)
   09/02/20 0032  Rapid Response Notification  Name of Rapid Response RN Notified Onalee Hua, Rapid Response  Date Rapid Response Notified 09/02/20  Time Rapid Response Notified 0033   Spoke with Onalee Hua, Rapid Response about patient converting back to Afib with RVR. Heart rate jumping between 120s to 150s. Denies CP or SOB. Md on call aware. See new order.   0038. David, Rapid Response at bedside to check in on patient.

## 2020-09-02 NOTE — Progress Notes (Signed)
Progress Note  Patient Name: Raymond EndsRobert [place] Date of Encounter: 09/02/2020  Primary Cardiologist: None  New to Dr. Katrinka BlazingSmith [facility] (echo in 2021, distant f/u)  Subjective   Rapid rseponse for AF RVR early this AM.  Rates have been elevated. BP has been elevated save for one reading ~ 5 AM.  Self  converted but family has noted new tachypnea since AF RVR  Inpatient Medications    Scheduled Meds:  apixaban  5 mg Oral BID   aspirin EC  81 mg Oral Daily   atorvastatin  80 mg Oral q1800   carvedilol  12.5 mg Oral Once   carvedilol  25 mg Oral BID WC   colchicine  0.6 mg Oral BID   dapagliflozin propanediol  10 mg Oral Daily   FLUoxetine  10 mg Oral Daily   multivitamin with minerals  1 tablet Oral Daily   Continuous Infusions:  promethazine (PHENERGAN) injection (IM or IVPB)     PRN Meds: acetaminophen, ipratropium-albuterol, phenol, polyethylene glycol **OR** senna, promethazine (PHENERGAN) injection (IM or IVPB)   Vital Signs    Vitals:   09/02/20 0051 09/02/20 0313 09/02/20 0536 09/02/20 0637  BP: (!) 151/126 (!) 134/104 95/70   Pulse: (!) 115 64 100   Resp: 20 18 18    Temp: 98.4 F (36.9 C) 97.7 F (36.5 C) 97.6 F (36.4 C)   TempSrc: Axillary Axillary Axillary   SpO2: 96% 97% 95%   Weight:    103.6 kg  Height:        Intake/Output Summary (Last 24 hours) at 09/02/2020 0937 Last data filed at 09/02/2020 0600 Gross per 24 hour  Intake 120 ml  Output 1875 ml  Net -1755 ml   Filed Weights   08/31/20 0626 09/01/20 0500 09/02/20 0637  Weight: 102.7 kg 101.3 kg 103.6 kg    Telemetry    AF RVR -> SR- Personally Reviewed  ECG    AF RVR Rate 137- Personally Reviewed  Physical Exam   GEN: No acute distress.   Neck: No JVD Cardiac: RRR, no murmurs, or gallops no rub Respiratory: Coarse breath sounds in bases, tachpnea noted GI: Soft, nontender, non-distended  MS: No edema; No deformity. Neuro:  Nonfocal  Psych: Normal affect   Labs     Chemistry Recent Labs  Lab 08/27/20 [phone] 08/28/20 0443 08/31/20 0650 09/01/20 0332 09/02/20 0157  NA 137   < > 137 137 138  K 3.7   < > 4.4 3.5 4.0  CL 106   < > 106 108 110  CO2 23   < > 22 21* 20*  GLUCOSE 136*   < > 128* 136* 119*  BUN 17   < > 28* 25* 24*  CREATININE 1.74*   < > 1.68* 1.52* 1.48*  CALCIUM 8.2*   < > 8.3* 8.2* 8.2*  PROT 6.4*  --   --   --   --   ALBUMIN 2.6*  --   --   --   --   AST 28  --   --   --   --   ALT 22  --   --   --   --   ALKPHOS 107  --   --   --   --   BILITOT 1.1  --   --   --   --   GFRNONAA 42*   < > 44* 49* 51*  ANIONGAP 8   < > 9 8 8    < > =  values in this interval not displayed.     Hematology Recent Labs  Lab 08/31/20 0405 09/01/20 0332 09/02/20 0157  WBC 8.8 8.1 9.7  RBC 3.43* 3.31* 3.41*  HGB 11.0* 10.8* 11.0*  HCT 32.9* 31.6* 32.5*  MCV 95.9 95.5 95.3  MCH 32.1 32.6 32.3  MCHC 33.4 34.2 33.8  RDW 14.9 14.8 15.0  PLT 263 279 311    Cardiac EnzymesNo results for input(s): TROPONINI in the last 168 hours. No results for input(s): TROPIPOC in the last 168 hours.   BNP Recent Labs  Lab 08/27/20 0907  BNP 236.8*     DDimer  Recent Labs  Lab 08/27/20 0907  DDIMER 1.82*     Radiology    DG Chest 1 View  Result Date: 09/02/2020 CLINICAL DATA:  Shortness of breath EXAM: CHEST  1 VIEW COMPARISON:  08/29/2020 FINDINGS: Midline trachea. Cardiomegaly accentuated by AP portable technique. No pleural effusion or pneumothorax. Suspect developing bibasilar airspace disease. Upper lungs clear. IMPRESSION: Worsening bibasilar aeration, with suspicion of developing atelectasis versus infection. Cardiomegaly without congestive failure. Electronically Signed   By: Jeronimo Greaves M.D.   On: 09/02/2020 09:12   MR BRAIN WO CONTRAST  Result Date: 09/01/2020 CLINICAL DATA:  Initial evaluation for neuro deficit, stroke suspected. EXAM: MRI HEAD WITHOUT CONTRAST TECHNIQUE: Multiplanar, multiecho pulse sequences of the brain and  surrounding structures were obtained without intravenous contrast. COMPARISON:  Comparison made with prior CT from 08/30/2020 as well as previous MRI from 01/26/2020. FINDINGS: Brain: Diffuse prominence of the CSF containing spaces compatible with generalized age-related cerebral atrophy, moderately advanced in nature. Patchy and confluent T2/FLAIR hyperintensity involving the periventricular white matter most consistent with chronic microvascular ischemic disease, mild in nature. Mild patchy involvement of the pons noted as well. Few scattered superimposed remote lacunar infarcts present at the left anterior genu of the corpus callosum, white matter of the bilateral centrum semi ovale, thalami, and brainstem. Few additional small remote cerebellar infarcts noted. Prominent areas of encephalomalacia and gliosis involving the bilateral occipital lobes consistent with chronic PCA territory infarcts. Persistent diffusion abnormality seen about these chronic infarcts, right greater than left, similar to previous exam, and most likely related to chronic underlying blood products and/or laminar necrosis. No abnormal foci of restricted diffusion to suggest acute or subacute ischemia elsewhere within the brain. Gray-white matter differentiation otherwise maintained. No acute intracranial hemorrhage. Few scattered chronic micro hemorrhages noted about the cerebellum and deep gray nuclei, most likely related to chronic poorly controlled hypertension, similar to previous. No mass lesion, midline shift or mass effect. Diffuse ventricular prominence related to global parenchymal volume loss without hydrocephalus. No extra-axial fluid collection. Pituitary gland and suprasellar region normal. Midline structures intact. Vascular: Major intracranial vascular flow voids are maintained and stable in appearance. Susceptibility artifact related to left V4 segment stent again noted. Hypoplastic right vertebral artery with irregular flow  void noted as well. Skull and upper cervical spine: Craniocervical junction within normal limits. Mild spondylosis noted at C3-4 without significant stenosis. Remainder the visualized upper cervical spine otherwise unremarkable. Bone marrow signal intensity within normal limits. No scalp soft tissue abnormality. Sinuses/Orbits: Globes and orbital soft tissues demonstrate no acute finding. Mild scattered mucosal thickening noted within the ethmoidal air cells and right maxillary sinus. Small bilateral mastoid effusions noted, of doubtful significance. Visualized nasopharynx unremarkable. Inner ear structures grossly within normal limits. Other: None. IMPRESSION: 1. No acute intracranial infarct or other abnormality. 2. Chronic bilateral PCA territory infarcts, stable. 3. Underlying age-related cerebral  atrophy with chronic microvascular ischemic disease, with multiple additional remote lacunar infarcts as above, stable. Electronically Signed   By: Rise Mu M.D.   On: 09/01/2020 00:59   ECHOCARDIOGRAM LIMITED  Result Date: 08/31/2020    ECHOCARDIOGRAM LIMITED REPORT   Patient Name:   KELLEY KNOTH Date of Exam: 08/31/2020 Medical Rec #:  [id number]    Height:       70.0 in Accession #:    [id number]   Weight:       226.4 lb Date of Birth:  [birth year]    BSA:          2.200 m Patient Age:    69 years     BP:           111/68 mmHg Patient Gender: M            HR:           73 bpm. Exam Location:  Inpatient Procedure: Limited Echo, Cardiac Doppler and Color Doppler Indications:    Pericardial effusion  History:        Patient has prior history of Echocardiogram examinations.                 Previous Myocardial Infarction, Arrythmias:Atrial Fibrillation;                 Risk Factors:Dyslipidemia and Hypertension. Stroke.  Sonographer:    Ross Ludwig RDCS (AE) Referring Phys: [phone] Barry Dienes [facility]  Sonographer Comments: Dr. Verdis Prime bedside during echo. IMPRESSIONS  1. Anterolateral and posterolateral  hypokinesis. Left ventricular ejection fraction, by estimation, is 40 to 45%. The left ventricle has mildly decreased function. The left ventricle demonstrates regional wall motion abnormalities (see scoring diagram/findings for description).  2. Right ventricular systolic function is normal. The right ventricular size is normal.  3. Left atrial size was mildly dilated.  4. Moderate pericardial effusion measuring 0.5 cm anteriorly and 1.3 cm posteriorly. Unchanged from 08/27/20. Moderate pericardial effusion. The pericardial effusion is circumferential. There is no evidence of cardiac tamponade.  5. The mitral valve is normal in structure. Trivial mitral valve regurgitation. No evidence of mitral stenosis.  6. The aortic valve is tricuspid. Aortic valve regurgitation is not visualized. No aortic stenosis is present.  7. The inferior vena cava is normal in size with greater than 50% respiratory variability, suggesting right atrial pressure of 3 mmHg. FINDINGS  Left Ventricle: Anterolateral and posterolateral hypokinesis. Left ventricular ejection fraction, by estimation, is 40 to 45%. The left ventricle has mildly decreased function. The left ventricle demonstrates regional wall motion abnormalities. Right Ventricle: The right ventricular size is normal. No increase in right ventricular wall thickness. Right ventricular systolic function is normal. Left Atrium: Left atrial size was mildly dilated. Right Atrium: Right atrial size was normal in size. Pericardium: Moderate pericardial effusion measuring 0.5 cm anteriorly and 1.3 cm posteriorly. Unchanged from 08/27/20. A moderately sized pericardial effusion is present. The pericardial effusion is circumferential. There is excessive respiratory variation in the tricuspid valve spectral Doppler velocities. There is no evidence of cardiac tamponade. Mitral Valve: The mitral valve is normal in structure. Mild mitral annular calcification. Trivial mitral valve regurgitation.  No evidence of mitral valve stenosis. Tricuspid Valve: The tricuspid valve is grossly normal. Aortic Valve: The aortic valve is tricuspid. Aortic valve regurgitation is not visualized. No aortic stenosis is present. Pulmonic Valve: The pulmonic valve was grossly normal. Pulmonic valve regurgitation is trivial. Venous: The inferior vena cava is normal in size with  greater than 50% respiratory variability, suggesting right atrial pressure of 3 mmHg. Chilton Si MD Electronically signed by Chilton Si MD Signature Date/Time: 08/31/2020/4:59:41 PM    Final     Patient Profile     70 y.o. male Mi s/p PCI in 1990, Ischemic cardiomyopathy with anterolateral WMA, HTN and DM, Prior CVA with basilar artery stenting and HLD who presented with CP and new AF  Assessment & Plan    New PAF CHASDVASC 7  - on eliquis; increasing coreg to 25 mg PO BID given RVR, asymptomatic in RVR - if rates are persistently elevated despite increase in coreg; would digoxin load and monitor with kidney function - may end up needing amiodarone for supression  Ischemic Cardiomyopathy CAD with prior PCI Hx of CVD and HLD HTN and DM CKD Stabe III b - NYHA class III, Stage C, hypervolemic, etiology from ischemia; but appears to decompensate with RVR (denies sx) - Increased coreg as above defer ARNI/ARB/ACEi/MRA given potential need to add NSAIDs  - received 40 iV lasix X1; I have scheduled 40 IV lasix Daily with a dose this PM - continue [place] - outpatient will need evaluation for return of ischemic disease: LHC vs NM Stress based on sx and kidney fx   Pericardial effusion and Pericarditis FUO- none in last 24 hours CKD Stage 3b - on colchicine 0.6 mg BID with no sx - if CP resumes options for treatment include ibuprofen 600 mg TID with early kidney follow up or ASA 625 mg ( with eliquis) and early CBC follow up for bleeding - slight elevating in RA and elevation in Sed Rate; consider Rheum f/u if no clear  etiology can be identified  Discussed with family  For questions or updates, please contact [facility] Please consult [email or web address] for contact info under Cardiology/STEMI.      Signed, Christell Constant, MD  09/02/2020, 9:37 AM

## 2020-09-02 NOTE — Progress Notes (Signed)
   09/02/20 0313  Vitals  Temp 97.7 F (36.5 C)  Temp Source Axillary  BP (!) 134/104  MAP (mmHg) 112  BP Location Left Arm  BP Method Automatic  Patient Position (if appropriate) Lying  Pulse Rate 64  Pulse Rate Source Dinamap  Resp 18  Level of Consciousness  Level of Consciousness Alert  MEWS COLOR  MEWS Score Color Green  Oxygen Therapy  SpO2 97 %  O2 Device Nasal Cannula  O2 Flow Rate (L/min) 2 L/min  Patient Activity (if Appropriate) In bed  Pain Assessment  Pain Scale 0-10  Pain Score 0  MEWS Score  MEWS Temp 0  MEWS Systolic 0  MEWS Pulse 0  MEWS RR 0  MEWS LOC 0  MEWS Score 0

## 2020-09-02 NOTE — Progress Notes (Signed)
HD#4 Subjective:  Patient Summary:   Overnight Events: Noted to be in A fib with RVR for which he received carvedilol 25mg  with improvement.    This morning, Mr [name] is resting comfortably in bed. He denies any chest pain or palpitations but does note that he has some shortness of breath. Notes had a large bowel movement that he described as diarrhea last night. No abdominal pain, nausea or vomiting.   Objective:  Vital signs in last 24 hours: Vitals:   09/02/20 0051 09/02/20 0313 09/02/20 0536 09/02/20 0637  BP: (!) 151/126 (!) 134/104 95/70   Pulse: (!) 115 64 100   Resp: 20 18 18    Temp: 98.4 F (36.9 C) 97.7 F (36.5 C) 97.6 F (36.4 C)   TempSrc: Axillary Axillary Axillary   SpO2: 96% 97% 95%   Weight:    103.6 kg  Height:       Supplemental O2: Nasal Cannula SpO2: 95 % O2 Flow Rate (L/min): 2 L/min   Physical Exam:  Physical Exam  Constitutional: Appears well-developed and well-nourished. Mild respiratory distress  HENT: Normocephalic and atraumatic, EOMI,moist mucous membranes Cardiovascular: tachycardic, irregular rate, S1 and S2 present, no murmurs, rubs, gallops.  Distal pulses intact Respiratory: tachypneic with some accessory muscle use; bibasilar rhonchi, on 2L O2 GI: Nondistended, soft, nontender to palpation, active bowel sounds Musculoskeletal: Normal bulk and tone.  No peripheral edema noted. Neurological: Is alert and oriented x4, no apparent focal deficits noted. Skin: Warm and dry.  No rash, erythema, lesions noted. Psychiatric: Normal mood and affect. Behavior is normal. Judgment and thought content normal.   Filed Weights   08/31/20 0626 09/01/20 0500 09/02/20 0637  Weight: 102.7 kg 101.3 kg 103.6 kg     Intake/Output Summary (Last 24 hours) at 09/02/2020 0723 Last data filed at 09/02/2020 0600 Gross per 24 hour  Intake 120 ml  Output 1875 ml  Net -1755 ml   Net IO Since Admission: -438.01 mL [09/02/20 0723]  Pertinent  Labs: CBC Latest Ref Rng & Units 09/02/2020 09/01/2020 08/31/2020  WBC 4.0 - 10.5 K/uL 9.7 8.1 8.8  Hemoglobin 13.0 - 17.0 g/dL 11.0(L) 10.8(L) 11.0(L)  Hematocrit 39.0 - 52.0 % 32.5(L) 31.6(L) 32.9(L)  Platelets 150 - 400 K/uL 311 279 263    CMP Latest Ref Rng & Units 09/02/2020 09/01/2020 08/31/2020  Glucose 70 - 99 mg/dL 09/03/2020) 09/02/2020) 993(Z)  BUN 8 - 23 mg/dL 169(C) 789(F) 81(O)  Creatinine 0.61 - 1.24 mg/dL 17(P) 10(C) 5.85(I)  Sodium 135 - 145 mmol/L 138 137 137  Potassium 3.5 - 5.1 mmol/L 4.0 3.5 4.4  Chloride 98 - 111 mmol/L 110 108 106  CO2 22 - 32 mmol/L 20(L) 21(L) 22  Calcium 8.9 - 10.3 mg/dL 8.2(L) 8.2(L) 8.3(L)  Total Protein 6.5 - 8.1 g/dL - - -  Total Bilirubin 0.3 - 1.2 mg/dL - - -  Alkaline Phos 38 - 126 U/L - - -  AST 15 - 41 U/L - - -  ALT 0 - 44 U/L - - -    Imaging: No results found.  Assessment/Plan:   Principal Problem:   Acute pericarditis Active Problems:   Dyslipidemia   DM II (diabetes mellitus, type II), controlled (HCC)   Atrial fibrillation with RVR (HCC)   Acute-on-chronic kidney injury (HCC)   Dyspnea   Chest pain   Pericardial effusion   Patient Summary: [name] is a 69 y.o. with a pertinent PMH of CVA, CAD s/p PCI, type II  DM, hyperlipidemia and CKD admitted for paroxysmal atrial fibrillation with RVR 2/2 postviral pericarditis and resolving AKI.   Paroxysmal atrial fibrillation w/RVR 2/2 postviral pericarditis Acute hypoxic respiratory failure  Patient noted to be in Afib with RVR HR up to 150's overnight that improved with one dose of coreg. This morning, continues to be in Afib with RVR with elevated HR to 140-150s. He does have some dyspnea as well with oxygen requirement of 2L this morning. CXR obtained which shows some pulmonary edema with atelectasis. Patient given another dose of carvedilol 12.5mg  this morning. Coreg dosing increased to 25mg  twice daily.  - Cardiology consulted, appreciate their recommendations - Coreg  increased to 25mg  twice daily, can consider increasing night time dose or longer acting beta blocker - If persistent Afib with RVR, recommendation for digoxin load vs amiodarone  - IV Lasix 40mg  x1 dose - Continue colchicine 0.6mg  twice daily for 3 months - Continue Eliquis 5mg  bid  - Daily weights and strict intake/output - F/u Echo in 1 month as outpatient   AKI on CKDIII (improving) sCr stable this morning, 1.52>1.48, this appears to be close to his baseline of ~1.4. Patient noted to have some pulmonary edema on CXR so will diurese and monitor renal funciton. 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 TOC recs: SNF Family Update: son updated at bedside   Prior to Admission Living Arrangement: Home Anticipated Discharge Location: SNF Barriers to Discharge: Continued medical management, SNF placement Dispo: Anticipated discharge to Skilled nursing facility in 2-3 days pending clinical improvement and SNF placement.   , MD Internal Medicine Resident PGY-3 Pager# [phone]  Please contact the on call pager after 5 pm and on weekends at [phone].

## 2020-09-02 NOTE — Progress Notes (Signed)
   09/01/20 2333  Vitals  Temp 98.2 F (36.8 C)  Temp Source Axillary  BP 95/61  MAP (mmHg) 75  BP Location Right Arm  BP Method Automatic  Patient Position (if appropriate) Lying  Pulse Rate (!) 125  Pulse Rate Source Dinamap  Resp 20  Level of Consciousness  Level of Consciousness Alert  MEWS COLOR  MEWS Score Color Yellow  Oxygen Therapy  SpO2 94 %  O2 Device Nasal Cannula  O2 Flow Rate (L/min) 2 L/min  Patient Activity (if Appropriate) In bed  Pulse Oximetry Type Continuous  Pain Assessment  Pain Scale 0-10  Pain Score 0  Complaints & Interventions  Complains of Other (Comment) (denies CP or SOB)  Interventions Other (comment) (EKG done, MD on call notified, Rapid Response Onalee Hua) notified. Charge RN, Windell Moulding, aware of patient status.)  Patients response to intervention Effective  PCA/Epidural/Spinal Assessment  Respiratory Pattern Regular;Unlabored  Glasgow Coma Scale  Eye Opening 4  Best Verbal Response (NON-intubated) 5  Best Motor Response 6  Glasgow Coma Scale Score 15  MEWS Score  MEWS Temp 0  MEWS Systolic 1  MEWS Pulse 2  MEWS RR 0  MEWS LOC 0  MEWS Score 3  Rapid Response Notification  Name of Rapid Response RN Notified David  Date Rapid Response Notified 09/02/20  Time Rapid Response Notified 0030

## 2020-09-03 DIAGNOSIS — I313 Pericardial effusion (noninflammatory): Secondary | ICD-10-CM

## 2020-09-03 DIAGNOSIS — R0600 Dyspnea, unspecified: Secondary | ICD-10-CM | POA: Diagnosis not present

## 2020-09-03 DIAGNOSIS — I309 Acute pericarditis, unspecified: Secondary | ICD-10-CM | POA: Diagnosis not present

## 2020-09-03 DIAGNOSIS — R0789 Other chest pain: Secondary | ICD-10-CM

## 2020-09-03 DIAGNOSIS — I4891 Unspecified atrial fibrillation: Secondary | ICD-10-CM | POA: Diagnosis not present

## 2020-09-03 LAB — CBC
HCT: 34.6 % — ABNORMAL LOW (ref 39.0–52.0)
Hemoglobin: 11.5 g/dL — ABNORMAL LOW (ref 13.0–17.0)
MCH: 31.5 pg (ref 26.0–34.0)
MCHC: 33.2 g/dL (ref 30.0–36.0)
MCV: 94.8 fL (ref 80.0–100.0)
Platelets: 376 10*3/uL (ref 150–400)
RBC: 3.65 MIL/uL — ABNORMAL LOW (ref 4.22–5.81)
RDW: 14.9 % (ref 11.5–15.5)
WBC: 10.1 10*3/uL (ref 4.0–10.5)
nRBC: 0 % (ref 0.0–0.2)

## 2020-09-03 LAB — BASIC METABOLIC PANEL
Anion gap: 7 (ref 5–15)
BUN: 28 mg/dL — ABNORMAL HIGH (ref 8–23)
CO2: 25 mmol/L (ref 22–32)
Calcium: 8.2 mg/dL — ABNORMAL LOW (ref 8.9–10.3)
Chloride: 105 mmol/L (ref 98–111)
Creatinine, Ser: 1.69 mg/dL — ABNORMAL HIGH (ref 0.61–1.24)
GFR, Estimated: 43 mL/min — ABNORMAL LOW (ref >60)
Glucose, Bld: 133 mg/dL — ABNORMAL HIGH (ref 70–99)
Potassium: 3.4 mmol/L — ABNORMAL LOW (ref 3.5–5.1)
Sodium: 137 mmol/L (ref 135–145)

## 2020-09-03 LAB — EXTRACTABLE NUCLEAR ANTIGEN ANTIBODY
ENA SM Ab Ser-aCnc: <0.2 AI (ref 0.0–0.9)
Ribonucleic Protein: 0.2 AI (ref 0.0–0.9)
SSA (Ro) (ENA) Antibody, IgG: <0.2 AI (ref 0.0–0.9)
SSB (La) (ENA) Antibody, IgG: <0.2 AI (ref 0.0–0.9)
Scleroderma (Scl-70) (ENA) Antibody, IgG: 0.5 AI (ref 0.0–0.9)
ds DNA Ab: 2 IU/mL (ref 0–9)

## 2020-09-03 LAB — ANTI-JO 1 ANTIBODY, IGG: Anti JO-1: <0.2 AI (ref 0.0–0.9)

## 2020-09-03 MED ORDER — FUROSEMIDE 20 MG PO TABS
20.0000 mg | ORAL_TABLET | Freq: Every day | ORAL | Status: DC
  Administered 2020-09-03 – 2020-09-04 (×2): 20 mg via ORAL
  Filled 2020-09-03 (×2): qty 1

## 2020-09-03 MED ORDER — CARVEDILOL 25 MG PO TABS: 25.0000 mg | ORAL_TABLET | Freq: Two times a day (BID) | ORAL | Status: DC

## 2020-09-03 MED ORDER — POTASSIUM CHLORIDE CRYS ER 20 MEQ PO TBCR
30.0000 meq | EXTENDED_RELEASE_TABLET | Freq: Four times a day (QID) | ORAL | Status: AC
  Administered 2020-09-03 (×2): 30 meq via ORAL
  Filled 2020-09-03 (×2): qty 1

## 2020-09-03 MED ORDER — HYDROCORTISONE 1 % EX CREA
TOPICAL_CREAM | Freq: Two times a day (BID) | CUTANEOUS | Status: DC
  Filled 2020-09-03: qty 28

## 2020-09-03 NOTE — TOC Progression Note (Signed)
Transition of Care [facility]) - Progression Note    Patient Details  Name: [name] MRN: [id number] Date of Birth: 1950/04/15  [facility] CM/SW Contact  Jimmy Picket, [place] Phone Number: 09/03/2020, 5:12 PM  Clinical Narrative:     Pt has no bed offers at this time. CSW faxed to facilities further out.   Expected Discharge Plan: Skilled Nursing Facility Barriers to Discharge: Continued Medical Work up, English as a second language teacher, SNF Pending bed offer  Expected Discharge Plan and Services Expected Discharge Plan: Skilled Nursing Facility In-house Referral: Clinical Social Work     Living arrangements for the past 2 months: Single Family Home                                       Social Determinants of Health (SDOH) Interventions    Readmission Risk Interventions No flowsheet data found.  Jimmy Picket, Theresia Majors, [place] Clinical Social Worker [phone]

## 2020-09-03 NOTE — Progress Notes (Addendum)
Progress Note  Patient Name: [name] Date of Encounter: 09/03/2020  [facility] Cardiologist: None   Subjective   Doing well today. Up in the chair, hoping to ambulate with PT.   Inpatient Medications    Scheduled Meds:  apixaban  5 mg Oral BID   aspirin EC  81 mg Oral Daily   atorvastatin  80 mg Oral q1800   carvedilol  25 mg Oral BID WC   colchicine  0.6 mg Oral BID   dapagliflozin propanediol  10 mg Oral Daily   FLUoxetine  10 mg Oral Daily   furosemide  20 mg Oral Daily   multivitamin with minerals  1 tablet Oral Daily   potassium chloride  30 mEq Oral Q6H   Continuous Infusions:  promethazine (PHENERGAN) injection (IM or IVPB)     PRN Meds: acetaminophen, ipratropium-albuterol, phenol, polyethylene glycol **OR** senna, promethazine (PHENERGAN) injection (IM or IVPB)   Vital Signs    Vitals:   09/02/20 1657 09/03/20 0036 09/03/20 0521 09/03/20 0741  BP: 103/67 106/78 110/74 123/79  Pulse: 78 78 71 70  Resp: 17 18 18    Temp: 99.2 F (37.3 C) 98.6 F (37 C) 98.1 F (36.7 C)   TempSrc: Oral Oral Oral   SpO2: 99% 94% 96% 94%  Weight:   98 kg   Height:        Intake/Output Summary (Last 24 hours) at 09/03/2020 1028 Last data filed at 09/03/2020 0200 Gross per 24 hour  Intake 240 ml  Output 4600 ml  Net -4360 ml   Last 3 Weights 09/03/2020 09/02/2020 09/01/2020  Weight (lbs) 216 lb 0.8 oz 228 lb 6.3 oz 223 lb 5.2 oz  Weight (kg) 98 kg 103.6 kg 101.3 kg      Telemetry    SR ( last episode of Afib morning of 7/31) - Personally Reviewed  ECG    No new tracing this morning  Physical Exam   GEN: No acute distress. Sitting up in chair, on [place] @2L  Neck: No JVD Cardiac: RRR, no murmurs, rubs, or gallops.  Respiratory: Clear to auscultation bilaterally. GI: Soft, nontender, non-distended  MS: No edema; No deformity. Neuro:  Nonfocal  Psych: Normal affect   Labs    High Sensitivity Troponin:   Recent Labs  Lab 08/27/20 0907 08/27/20 1107   TROPONINIHS 27* 23*      Chemistry Recent Labs  Lab 09/01/20 0332 09/02/20 0157 09/03/20 0348  NA 137 138 137  K 3.5 4.0 3.4*  CL 108 110 105  CO2 21* 20* 25  GLUCOSE 136* 119* 133*  BUN 25* 24* 28*  CREATININE 1.52* 1.48* 1.69*  CALCIUM 8.2* 8.2* 8.2*  GFRNONAA 49* 51* 43*  ANIONGAP 8 8 7      Hematology Recent Labs  Lab 09/01/20 0332 09/02/20 0157 09/03/20 0348  WBC 8.1 9.7 10.1  RBC 3.31* 3.41* 3.65*  HGB 10.8* 11.0* 11.5*  HCT 31.6* 32.5* 34.6*  MCV 95.5 95.3 94.8  MCH 32.6 32.3 31.5  MCHC 34.2 33.8 33.2  RDW 14.8 15.0 14.9  PLT 279 311 376    BNPNo results for input(s): BNP, PROBNP in the last 168 hours.   DDimer No results for input(s): DDIMER in the last 168 hours.   Radiology    DG Chest 1 View  Result Date: 09/02/2020 CLINICAL DATA:  Shortness of breath EXAM: CHEST  1 VIEW COMPARISON:  08/29/2020 FINDINGS: Midline trachea. Cardiomegaly accentuated by AP portable technique. No pleural effusion or pneumothorax. Suspect developing  bibasilar airspace disease. Upper lungs clear. IMPRESSION: Worsening bibasilar aeration, with suspicion of developing atelectasis versus infection. Cardiomegaly without congestive failure. Electronically Signed   By: Jeronimo Greaves M.D.   On: 09/02/2020 09:12    Cardiac Studies   Echo: 08/31/20  IMPRESSIONS     1. Anterolateral and posterolateral hypokinesis. Left ventricular  ejection fraction, by estimation, is 40 to 45%. The left ventricle has  mildly decreased function. The left ventricle demonstrates regional wall  motion abnormalities (see scoring  diagram/findings for description).   2. Right ventricular systolic function is normal. The right ventricular  size is normal.   3. Left atrial size was mildly dilated.   4. Moderate pericardial effusion measuring 0.5 cm anteriorly and 1.3 cm  posteriorly. Unchanged from 08/27/20. Moderate pericardial effusion. The  pericardial effusion is circumferential. There is no  evidence of cardiac  tamponade.   5. The mitral valve is normal in structure. Trivial mitral valve  regurgitation. No evidence of mitral stenosis.   6. The aortic valve is tricuspid. Aortic valve regurgitation is not  visualized. No aortic stenosis is present.   7. The inferior vena cava is normal in size with greater than 50%  respiratory variability, suggesting right atrial pressure of 3 mmHg.   FINDINGS   Left Ventricle: Anterolateral and posterolateral hypokinesis. Left  ventricular ejection fraction, by estimation, is 40 to 45%. The left  ventricle has mildly decreased function. The left ventricle demonstrates  regional wall motion abnormalities.   Right Ventricle: The right ventricular size is normal. No increase in  right ventricular wall thickness. Right ventricular systolic function is  normal.   Left Atrium: Left atrial size was mildly dilated.   Right Atrium: Right atrial size was normal in size.   Pericardium: Moderate pericardial effusion measuring 0.5 cm anteriorly and  1.3 cm posteriorly. Unchanged from 08/27/20. A moderately sized pericardial  effusion is present. The pericardial effusion is circumferential. There is  excessive respiratory  variation in the tricuspid valve spectral Doppler velocities. There is no  evidence of cardiac tamponade.   Mitral Valve: The mitral valve is normal in structure. Mild mitral annular  calcification. Trivial mitral valve regurgitation. No evidence of mitral  valve stenosis.   Tricuspid Valve: The tricuspid valve is grossly normal.   Aortic Valve: The aortic valve is tricuspid. Aortic valve regurgitation is  not visualized. No aortic stenosis is present.   Pulmonic Valve: The pulmonic valve was grossly normal. Pulmonic valve  regurgitation is trivial.   Venous: The inferior vena cava is normal in size with greater than 50%  respiratory variability, suggesting right atrial pressure of 3 mmHg.   Patient Profile     70 y.o.  male with PMH of MI s/p PCI in 1990, Ischemic cardiomyopathy with anterolateral WMA, HTN and DM, Prior CVA with basilar artery stenting and HLD who presented with CP and new AF  Assessment & Plan    Paroxysmal atrial fibrillation: Has had intermittent episodes during admission.  Currently in sinus rhythm this morning.  Treated with Coreg 25 mg twice daily along with Eliquis 5 mg twice daily.  Suspect his viral illness along with pericarditis contributing to recurrent episodes of A. fib. --At this time would continue Coreg 25 mg twice daily along with Eliquis 5 mg twice daily as he is maintaining sinus rhythm.  If he develops recurrent episodes may need EP evaluation as an outpatient.  Ischemic cardiomyopathy: EF of 40 to 45%.  Treated with beta-blocker.  Not on ACE/ARB/Spiro  given CKD, and need for colchicine. --Was started on SGLT2 --Given IV Lasix 40 mg yesterday with 4.6 L urine output.  We will add Lasix 20 mg daily  Pericardial effusion/pericarditis: Was started on colchicine 0.6 mg twice daily with significant improvement in symptoms.  Deferred initiation of steroids given resolution of symptoms.  -- Limited echo 7/29 with unchanged moderate pericardial effusion  CAD: Status post MI with stenting in '90.  Recommendations for outpatient work-up with Myoview  CKD stage IIIb: Creatinine stable at 1.6  Hypokalemia: K+ 3.4 --Supplement  Suspect he is nearing discharge, will arrange outpatient follow-up in the office.   For questions or updates, please contact [facility] Please consult [email or web address] for contact info under        Signed, Laverda Page, NP  09/03/2020, 10:28 AM    Agree with note by Laverda Page NP-C  Patient being seen for pericarditis started on colchicine with improvement in pain.  He does have moderate LV dysfunction.  Good diuresis on IV Lasix transitioning to p.o.  Currently in sinus rhythm on beta-blockade and Eliquis.  SGLT2 started.  Holding off on  ACE/ARB for now.  If he maintains sinus rhythm probably stable for discharge tomorrow.  Runell Gess, M.D., FACP, [facility], [facility] [address] [place], [place]  [address]  [phone] 09/03/2020 12:05 PM

## 2020-09-03 NOTE — Progress Notes (Signed)
Physical Therapy Treatment Patient Details Name: [name] MRN: [id number] DOB: 1950-04-18 Today's Date: 09/03/2020    History of Present Illness Pt is a 70yo male who presents to [facility] ED on 7/25 with dizziness and SOB, found to have new atrial fibrillation with RVR. Xray no significant findings. CT abdomen/pelvis found mild pericardial effusion. MRI no acute findings. PMH: multiple CVA, DM, CKD, HTN.    PT Comments    Pt progressing towards goals. Noted improved strength and balance this session; however, continues to be limited by fatigue. Performed transfers and short-distance ambulation with min guard assist for safety, utilizing RW. Pt completed five sit-to-stand exercises with rest breaks as needed as well as UE exercises in standing. Updated recommendations to SNF as wife prefers to be closer to [facility]. We will continue to follow acutely to promote independence with functional mobility.   Follow Up Recommendations  SNF     Equipment Recommendations  3in1 (PT)    Recommendations for Other Services       Precautions / Restrictions Precautions Precautions: Fall Restrictions Weight Bearing Restrictions: No    Mobility  Bed Mobility               General bed mobility comments: Patient seated in chair upon entry    Transfers Overall transfer level: Needs assistance Equipment used: Rolling walker (2 wheeled) Transfers: Sit to/from Stand Sit to Stand: Min guard         General transfer comment: Pt required min gaurd for safety; required multimodal cuing for hand placement on sit to stand and stand to sit. Pt performed sit<>stand X 5 during session.  Ambulation/Gait Ambulation/Gait assistance: Min guard Gait Distance (Feet): 15 Feet Assistive device: Rolling walker (2 wheeled) Gait Pattern/deviations: Decreased step length - right;Decreased dorsiflexion - right;Decreased weight shift to right Gait velocity: decreased   General Gait Details: Pt required min  guard for safety. Pt is moving R LE much better than during initial eval.  R ankle DF more functional though continue to note weakness when taking steps. Pt demonstrating difficulty with sidestepping to the right. (see exercises for more sidestepping detail). Pt fatiguing which limited further gait.   Stairs             Wheelchair Mobility    Modified Rankin (Stroke Patients Only)       Balance Overall balance assessment: Needs assistance Sitting-balance support: Feet supported Sitting balance-Leahy Scale: Fair Sitting balance - Comments: Improved sitting posture from initial eval   Standing balance support: Bilateral upper extremity supported;During functional activity Standing balance-Leahy Scale: Poor Standing balance comment: Pt is reliant on BUE on RW during standing             High level balance activites: Side stepping;Backward walking High Level Balance Comments: pt demonstrated decreased step length with backward stepping; pt demonstrated decreased step width with side stepping. multimodal cuing for appropriate use of RW. More difficulty noted during R side stepping than L.            Cognition Arousal/Alertness: Awake/alert Behavior During Therapy: WFL for tasks assessed/performed Overall Cognitive Status: History of cognitive impairments - at baseline                                 General Comments: history of multiple CVA      Exercises Other Exercises Other Exercises: D1 UE flexion & extension x20, bilaterally, in standing, utilizing RW for support  of contralateral UE. Min guard for safety Other Exercises: D2 UE flexion and extension x20 ,bilaterally, in standing, utilizing RW for support of contralateral UE. Min guard for safety Other Exercises: Sit to stand and return from chair, utilizing RW, x5. multimodal cues for hand placement, forward weight-shift in chair, feet shifted posterior for increased power to stand    General  Comments General comments (skin integrity, edema, etc.): Pt's wife in room, pt demonstrated decreased sounds of breathing during session      Pertinent Vitals/Pain Pain Assessment: No/denies pain    Home Living                      Prior Function            PT Goals (current goals can now be found in the care plan section) Acute Rehab PT Goals Patient Stated Goal: to get better PT Goal Formulation: With patient/family Time For Goal Achievement: 09/13/20 Potential to Achieve Goals: Good Progress towards PT goals: Progressing toward goals    Frequency    Min 2X/week      PT Plan Discharge plan needs to be updated;Frequency needs to be updated    Co-evaluation              AM-PAC PT "6 Clicks" Mobility   Outcome Measure  Help needed turning from your back to your side while in a flat bed without using bedrails?: A Lot Help needed moving from lying on your back to sitting on the side of a flat bed without using bedrails?: A Lot Help needed moving to and from a bed to a chair (including a wheelchair)?: A Lot Help needed standing up from a chair using your arms (e.g., wheelchair or bedside chair)?: A Little Help needed to walk in hospital room?: A Little Help needed climbing 3-5 steps with a railing? : A Lot 6 Click Score: 14    End of Session Equipment Utilized During Treatment: Gait belt;Oxygen (2L via [place]) Activity Tolerance: Patient limited by fatigue Patient left: in chair;with call bell/phone within reach;with family/visitor present;with chair alarm set Nurse Communication: Mobility status PT Visit Diagnosis: Unsteadiness on feet (R26.81);Muscle weakness (generalized) (M62.81);Difficulty in walking, not elsewhere classified (R26.2);Other symptoms and signs involving the nervous system (R29.898)     Time: 3491-7915 PT Time Calculation (min) (ACUTE ONLY): 23 min  Charges:  $Gait Training: 8-22 mins $Therapeutic Exercise: 8-22 mins                      [name], SPT   [name] 09/03/2020, 2:28 PM

## 2020-09-03 NOTE — Progress Notes (Signed)
RN paged MD because pt HR dropped down to 45s non sustained came back up to 60s. Pt sitting in room non symptomatic NT will obtain VS MD team paged to notify.

## 2020-09-03 NOTE — Progress Notes (Addendum)
Subjective:  Pt was sitting up in bed with son at bedside during morning rounds. He reports that he is doing better this morning. No shortness of breath or chest pain. Plan for SNF placement or HHPT was discussed with family. Preference of the family was to get him in a SNF in [facility] otherwise if that is not successful, they are happy with home PT.   Objective: Physical exam: General: well-appearing man sitting up in chair.  CV: No murmurs.  Lungs: No increased work of breathing on [place]. CTAB.  Abd: Soft but distended abdomen.  Extremities: No LEE.   Vital signs in last 24 hours: Vitals:   09/03/20 0036 09/03/20 0521 09/03/20 0741 09/03/20 1217  BP: 106/78 110/74 123/79 105/74  Pulse: 78 71 70 68  Resp: 18 18  16   Temp: 98.6 F (37 C) 98.1 F (36.7 C)  98.6 F (37 C)  TempSrc: Oral Oral  Oral  SpO2: 94% 96% 94% 100%  Weight:  98 kg    Height:       Weight change: -5.6 kg  Intake/Output Summary (Last 24 hours) at 09/03/2020 1334 Last data filed at 09/03/2020 0200 Gross per 24 hour  Intake 240 ml  Output 3600 ml  Net -3360 ml   CBC Latest Ref Rng & Units 09/03/2020 09/02/2020 09/01/2020  WBC 4.0 - 10.5 K/uL 10.1 9.7 8.1  Hemoglobin 13.0 - 17.0 g/dL 11.5(L) 11.0(L) 10.8(L)  Hematocrit 39.0 - 52.0 % 34.6(L) 32.5(L) 31.6(L)  Platelets 150 - 400 K/uL 376 311 279   CMP Latest Ref Rng & Units 09/03/2020 09/02/2020 09/01/2020  Glucose 70 - 99 mg/dL 09/03/2020) 270(J) 500(X)  BUN 8 - 23 mg/dL 381(W) 29(H) 37(J)  Creatinine 0.61 - 1.24 mg/dL 69(C) 7.89(F) 8.10(F)  Sodium 135 - 145 mmol/L 137 138 137  Potassium 3.5 - 5.1 mmol/L 3.4(L) 4.0 3.5  Chloride 98 - 111 mmol/L 105 110 108  CO2 22 - 32 mmol/L 25 20(L) 21(L)  Calcium 8.9 - 10.3 mg/dL 8.2(L) 8.2(L) 8.2(L)  Total Protein 6.5 - 8.1 g/dL - - -  Total Bilirubin 0.3 - 1.2 mg/dL - - -  Alkaline Phos 38 - 126 U/L - - -  AST 15 - 41 U/L - - -  ALT 0 - 44 U/L - - -    Assessment/Plan:  Principal Problem:   Acute  pericarditis Active Problems:   Dyslipidemia   DM II (diabetes mellitus, type II), controlled (HCC)   Atrial fibrillation with RVR (HCC)   Acute-on-chronic kidney injury (HCC)   Dyspnea   Chest pain   Pericardial effusion  69 yoM w/ long pmhx notable for MI, CVA, HLD, T2DM and CKD admitted [facility] the [facility] with paroxysmal atrial fibrillation with RVR 2/2 postviral pericarditis and resolving AKI.  Paroxysmal atrial fibrillation w/RVR 2/2 postviral pericarditis Acute hypoxic respiratory failure Pt presented w/ afib with RVR due viral pericarditis being treated with colchicine 0.6mg  bid. He is now in sinus rhythm, HR is 68 bpm with carvedilol 25 mg bid, increased from 12.5 mgs yesterday. He continues to have a supplemental oxygen requirement this morning, on 2LNC but appears euvolemic.  -continue carvedilol 25mg  bid -continue colchicine 0.6mg  bid for 3 months  -continue abixaban 5mg  bid -daily weights and I/Os -Outpatient: f/u echo in a month, if a.fib is recurrent can consider EP evaluation and sleep study  Improving AKI on CKD Stage 3a: Bun and creatinine slightly increased today likely secondary to diuresis while hypovolemic yesterday. Will get repeat  labs tomorrow to assess for improvement.  -BMP tomorrow  -avoid nephrotoxic agents  Physical deconditioning Resolved posterior and R lateral lean and R foot drag Assessed by PT this morning, improving status however pt could benefit from short term physical therapy in SNF.  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 Currently on carvedilol, aspirin, statin and started on SGLT2i during hospitalization.  -Outpatient: consider ischemic disease work up Celine Ahr)  HLD: Well controlled. On home atorvastatin.   HTN: Pt continues to have stable BP. Continue carvedilol 25 mg bid.   T2DM: Diet controlled diabetes. Last A1c was 5.9 on 03/29/20.   Gout: Last flare up in 08/2019. Patient  receiving colchicine for treatment of pericarditis. Can resume home colchicine after current course.   Diet: Heart Healthy VTE: apixaban 5mg  daily  Code: Full PT/OT recs: SNF for Subacute PT, bedside commode. TOC recs: SNF Family Update: son and wife updated at bedside    Prior to Admission Living Arrangement: Home Anticipated Discharge Location: SNF Barriers to Discharge: Continued medical management, SNF placement Dispo: Anticipated discharge to Skilled nursing facility in 2-3 days pending clinical improvement and SNF placement.   LOS: 5 days   T, Medical Student 09/03/2020, 1:34 PM

## 2020-09-03 NOTE — Progress Notes (Signed)
Pt has rash on his back wife states it has been there for about a day or two. MD notified and stated they would come and look at it

## 2020-09-04 ENCOUNTER — Other Ambulatory Visit: Payer: Self-pay | Admitting: Cardiology

## 2020-09-04 ENCOUNTER — Encounter (HOSPITAL_COMMUNITY): Payer: Self-pay | Admitting: Internal Medicine

## 2020-09-04 ENCOUNTER — Other Ambulatory Visit (HOSPITAL_COMMUNITY): Payer: Self-pay

## 2020-09-04 DIAGNOSIS — I309 Acute pericarditis, unspecified: Secondary | ICD-10-CM | POA: Diagnosis not present

## 2020-09-04 DIAGNOSIS — I313 Pericardial effusion (noninflammatory): Secondary | ICD-10-CM

## 2020-09-04 DIAGNOSIS — I3139 Other pericardial effusion (noninflammatory): Secondary | ICD-10-CM

## 2020-09-04 LAB — BASIC METABOLIC PANEL
Anion gap: 8 (ref 5–15)
BUN: 24 mg/dL — ABNORMAL HIGH (ref 8–23)
CO2: 24 mmol/L (ref 22–32)
Calcium: 8.4 mg/dL — ABNORMAL LOW (ref 8.9–10.3)
Chloride: 104 mmol/L (ref 98–111)
Creatinine, Ser: 1.62 mg/dL — ABNORMAL HIGH (ref 0.61–1.24)
GFR, Estimated: 46 mL/min — ABNORMAL LOW (ref >60)
Glucose, Bld: 171 mg/dL — ABNORMAL HIGH (ref 70–99)
Potassium: 3.7 mmol/L (ref 3.5–5.1)
Sodium: 136 mmol/L (ref 135–145)

## 2020-09-04 LAB — CBC
HCT: 37.2 % — ABNORMAL LOW (ref 39.0–52.0)
Hemoglobin: 12.4 g/dL — ABNORMAL LOW (ref 13.0–17.0)
MCH: 32.1 pg (ref 26.0–34.0)
MCHC: 33.3 g/dL (ref 30.0–36.0)
MCV: 96.4 fL (ref 80.0–100.0)
Platelets: 401 10*3/uL — ABNORMAL HIGH (ref 150–400)
RBC: 3.86 MIL/uL — ABNORMAL LOW (ref 4.22–5.81)
RDW: 15 % (ref 11.5–15.5)
WBC: 8.9 10*3/uL (ref 4.0–10.5)
nRBC: 0 % (ref 0.0–0.2)

## 2020-09-04 MED ORDER — DAPAGLIFLOZIN PROPANEDIOL 10 MG PO TABS: 10.0000 mg | ORAL_TABLET | Freq: Every day | ORAL | 1 refills | Status: DC

## 2020-09-04 MED ORDER — CARVEDILOL 6.25 MG PO TABS: 3.1250 mg | ORAL_TABLET | Freq: Two times a day (BID) | ORAL | 0 refills | Status: DC

## 2020-09-04 MED ORDER — APIXABAN 5 MG PO TABS: 5.0000 mg | ORAL_TABLET | Freq: Two times a day (BID) | ORAL | 1 refills | Status: AC

## 2020-09-04 MED ORDER — COLCHICINE 0.6 MG PO TABS: 0.6000 mg | ORAL_TABLET | Freq: Two times a day (BID) | ORAL | 2 refills | Status: AC

## 2020-09-04 MED ORDER — CARVEDILOL 12.5 MG PO TABS: 12.5000 mg | ORAL_TABLET | Freq: Two times a day (BID) | ORAL | Status: DC

## 2020-09-04 NOTE — Progress Notes (Signed)
Occupational Therapy Treatment Patient Details Name: [name] MRN: [id number] DOB: Aug 11, 1950 Today's Date: 09/04/2020    History of present illness Pt is a 70yo male who presents to [facility] ED on 7/25 with dizziness and SOB, found to have new atrial fibrillation with RVR. Xray no significant findings. CT abdomen/pelvis found mild pericardial effusion. MRI no acute findings. PMH: multiple CVA, DM, CKD, HTN.   OT comments  Patient met seated in recliner in agreement with OT treatment session. Focus of session on patient/family education in prep for safe d/c home. Patient also ambulated 300+ft with use of RW on RA. SpO2 remained >94% throughout. Recommendation updated from SNF to [facility] and initial 24hr supervision/assist. OT will continue to follow acutely.    Follow Up Recommendations  Home health OT;Supervision/Assistance - 24 hour    Equipment Recommendations  3 in 1 bedside commode    Recommendations for Other Services      Precautions / Restrictions Precautions Precautions: Fall Restrictions Weight Bearing Restrictions: No       Mobility Bed Mobility               General bed mobility comments: Pt up in recliner.    Transfers Overall transfer level: Needs assistance Equipment used: Rolling walker (2 wheeled) Transfers: Sit to/from Stand Sit to Stand: Min guard Stand pivot transfers: Min guard       General transfer comment: cues for sequencing, assist for safety    Balance Overall balance assessment: Needs assistance Sitting-balance support: Feet supported;No upper extremity supported Sitting balance-Leahy Scale: Good     Standing balance support: Bilateral upper extremity supported;During functional activity Standing balance-Leahy Scale: Poor Standing balance comment: reliant on external support                           ADL either performed or assessed with clinical judgement   ADL                                                Vision       Perception     Praxis      Cognition Arousal/Alertness: Awake/alert Behavior During Therapy: WFL for tasks assessed/performed Overall Cognitive Status: History of cognitive impairments - at baseline                                 General Comments: history of multiple CVA        Exercises     Shoulder Instructions       General Comments Wife present at bedside throughout sesison.    Pertinent Vitals/ Pain       Pain Assessment: No/denies pain  Home Living                                          Prior Functioning/Environment              Frequency  Min 2X/week        Progress Toward Goals  OT Goals(current goals can now be found in the care plan section)  Progress towards OT goals: Progressing toward goals  Acute Rehab OT Goals Patient Stated Goal: To return home. OT  Goal Formulation: With patient/family Time For Goal Achievement: 09/14/20 Potential to Achieve Goals: Good ADL Goals Pt Will Perform Grooming: with modified independence;with set-up;sitting Pt Will Perform Lower Body Bathing: with supervision;with min guard assist;sitting/lateral leans;sit to/from stand Pt Will Perform Lower Body Dressing: with supervision;with min guard assist;with caregiver independent in assisting;sitting/lateral leans;sit to/from stand Pt Will Transfer to Toilet: with supervision;ambulating;regular height toilet;bedside commode Pt Will Perform Toileting - Clothing Manipulation and hygiene: with supervision;sitting/lateral leans;sit to/from stand;with caregiver independent in assisting Pt Will Perform Tub/Shower Transfer: Shower transfer;shower seat;ambulating;rolling walker;with caregiver independent in assisting;with min assist;with min guard assist  Plan Discharge plan remains appropriate;Frequency remains appropriate    Co-evaluation                 AM-PAC OT "6 Clicks" Daily Activity     Outcome  Measure   Help from another person eating meals?: A Little Help from another person taking care of personal grooming?: A Little Help from another person toileting, which includes using toliet, bedpan, or urinal?: A Little Help from another person bathing (including washing, rinsing, drying)?: A Lot Help from another person to put on and taking off regular upper body clothing?: A Little Help from another person to put on and taking off regular lower body clothing?: A Lot 6 Click Score: 16    End of Session Equipment Utilized During Treatment: Gait belt;Rolling walker  OT Visit Diagnosis: Unsteadiness on feet (R26.81);Other abnormalities of gait and mobility (R26.89);Muscle weakness (generalized) (M62.81);Other symptoms and signs involving the nervous system (R29.898);Other symptoms and signs involving cognitive function   Activity Tolerance Patient tolerated treatment well   Patient Left in chair;with call bell/phone within reach;with family/visitor present   Nurse Communication Mobility status;Other (comment) (Response to treatment; O2 sats with mobility on RA.)        Time: 3709-6438 OT Time Calculation (min): 23 min  Charges: OT General Charges $OT Visit: 1 Visit OT Treatments $Therapeutic Activity: 8-22 mins  Anees Vanecek H. OTR/L Supplemental OT, Department of rehab services [phone]    Dedee Liss R H. 09/04/2020, 1:34 PM

## 2020-09-04 NOTE — Discharge Summary (Addendum)
Name: [name] MRN: [id number] DOB: [birth year] 70 y.o. 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 AKI on CKD Stage 3a  3. 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   aspirin EC 81 MG tablet Take 81 mg by mouth daily. Swallow whole.   Furosemide 20 MG tablet Take 20 mg by mouth daily.    atorvastatin 80 MG tablet Commonly known as: LIPITOR Take 80 mg by mouth daily at 6 PM.   carvedilol 6.25 MG tablet Commonly known as: COREG Take 0.5 tablets (3.125 mg total) by mouth 2 (two) times daily. What changed: medication strength   colchicine 0.6 MG tablet Take 1 tablet (0.6 mg total) by mouth 2 (two) times daily.   dapagliflozin propanediol 10 MG Tabs tablet Commonly known as: FARXIGA Take 1 tablet (10 mg total) by mouth daily. Start taking on: September 05, 2020   FLUoxetine 10 MG capsule Commonly known as: PROZAC Take 10 mg by mouth daily.   ONE-A-DAY MENS 50+ PO Take 2 tablets by mouth daily.   polyethylene glycol 17 g packet Commonly known as: MIRALAX / GLYCOLAX Take 17 g by mouth daily as needed for moderate constipation.   TART CHERRY ADVANCED PO Take 1,200 mg by mouth daily.               Durable Medical Equipment  (From admission, onward)           Start     Ordered   09/04/20 1401  DME 3-in-1  Once        09/04/20 1404   09/04/20 1249  For home use only DME 3 n 1  Once        09/04/20 1248             Disposition and follow-up:   RaymondRaymond Brown was discharged from [facility] in Stable condition.  At the hospital follow up visit please address:  1.  A.fib 2/2 postviral pericarditis: Please ensure patient is having improvement on colchicine 0.51m bid. He needs to take if for 3 months. Reassess size of pericardial effusion with echo around 3/4 weeks post discharge.   Pt discharged with 3 month supply of apixaban 54mbid and can reassess further need for apixaban after 3 months. Can consider Ziopatch to reassess reoccurrence of a.fib.  2.HFmrEF: Given HFmfEF 2/2 iscemic cardiomyopathy consider ischemic disease work up ([employer]  Pt started on dapagliflozin 10 mg daily during hospitalization.He was initially diuresed with IV Lasix and transitioned to Lasix 2045maily on discharge. After improvement of AKI, consider adding ACE/ARB/ARNI and/or spironolactone/Aldactone for improved HFmrEF management.  3. AKI on CKD: Please assess if pt continues to have hematuria and monitor kidney function at next follow up.   4.  Labs / imaging needed at time of follow-up:   Repeat BMP for creatine and BUN Repeat ESR and CRP in 2/3 weeks  Echocardiogram in 3/4 weeks Sleep  study if persistent atrial fibrillation  5.  Pending labs/ test needing follow-up: n/a  Follow-up Appointments:  Follow-up Information     Imogene Burn, PA-C Follow up on 10/03/2020.   Specialty: Cardiology Why: at 1:45pm for your follow up appt. Contact information: [address] [place] [address] [phone]         [facility] Follow up on 09/13/2020.   Specialty: Cardiology Why: at 4pm for your follow up echo Contact information: [address], [place], [facility] Follow up.   Specialty: Home Health Services Contact information: [place] [address] [phone]         Crista Elliot, PA-C. Schedule an appointment as soon as possible for a visit in 1 week(s).   Specialty: Family Medicine Contact information: [address] [place] [address] [phone]         Belva Crome, MD .   Specialty: Cardiology Contact information: [phone] [address] [place] by problem list: 1. Paroxysmal atrial fibrillation w/ RVR 2/2 postviral pericarditis: [name] is a 70 yoM with a PMHX of MI s/p stent (1990), diet controlled T2DM, CKD, HTN, HLD, CVA x4, HFrEF (EF 45%-50%) and obesity who presented to [facility] ED with non-radiating sternal chest pain and exertional dyspnea. He was found to have atrial fibrillation with RVR in the ED which was converted to sinus rhythm using IV diltiazem. He was then transitioned and rate controlled using carvedilol. He converted to sinus rhythm with few episodes of a.fib throughout his hospitalization. During his admission, he was found to have elevated d-dimers and later was febrile and hypoxic requiring 2LNC. V/Q scan on 7/25 showed a faint perfusion defect which could not r/o a PE. Due to his AKI, we decided against CTA. Also in setting of anticoagulation, pt adequately treated for potential PE. He was anticoagulated initially using heparin then transitioned to Eliquis on 7/26. Per Echo and CT abdomen, pt was noted to have a small circumferential pericardial effusion which was concerning for pericarditis. Pt started on colchicine 0.34m bid and tolerated medication well. Pt discharged on carvedilol 6.25 mg bid uptitrated from his home 3.125 mg dose. Instructed to continue apixaban 575mtwice daily, colchicine 0.10m3mwice daily for 3 months. Cardiology recommended an echo at 3/4 weeks post discharge. Can also consider further work up as an outpatient if a.fib is recurrent with an EP evaluation and  sleep study. Can reassess need for apixaban post-3 months if there is recurrent a.fib with Holter monitor.   AKI on CKD stage 3a: He was noted to have an AKI on CKD on admission. He had gross hematuria and u/a showed protein, hgb and RBC. CT abdomen on 7/26 showed bilateral nonobstructive nephrolithiasis and no hydronephrosis or renal obstruction. Hematuria likely 2/2 heparin. Creatine and BUN down trending with last values at 1.62 and 24 respectively.   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 Echo during hospitalization showed LVEF of 40-45% and left ventricle demonstrates regional wall motion abnormalities with apical hypokinesis. Patient was maintained on carvedilol during hospitalization and discharged with a dose of 6.25 mg bid. He was also started on dapagliflozin 10 mg daily.  Discharge Vitals Blood pressure 103/84, pulse 67, temperature 98.4 F (36.9 C), temperature source Oral, resp. rate 18, height 5' 10" (1.778 m), weight 97.8 kg, SpO2 98 %.  Filed Weights   09/02/20 0637 09/03/20 0521 09/04/20 0518  Weight: 103.6 kg 98 kg 97.8 kg   Discharge Exam: General: Well-appearing man in no acute distress. CV: RRR with no murmurs. Lungs: Normal work of breathing. Extremities: No LEE. Skin: Diffuse, blanchable erythematous macules on pressure areas of the back.   Pertinent Labs, Studies, and Procedures:    CBC Recent Labs    09/03/20 0348 09/04/20 0432  WBC 10.1 8.9  HGB 11.5* 12.4*  HCT 34.6* 37.2*  MCV 94.8 96.4  PLT 376 973*   Basic Metabolic Panel Recent Labs    09/03/20 0348 09/04/20 0432  NA 137 136  K 3.4* 3.7  CL 105 104  CO2 25 24  GLUCOSE 133* 171*  BUN 28* 24*  CREATININE 1.69* 1.62*  CALCIUM 8.2* 8.4*   Liver Function Tests No results for input(s): AST, ALT, ALKPHOS, BILITOT, PROT, ALBUMIN in the last 72 hours. No results for input(s): LIPASE, AMYLASE in the last 72 hours. Cardiac Enzymes No results for  input(s): CKTOTAL, CKMB, CKMBINDEX, TROPONINI in the last 72 hours. BNP Invalid input(s): POCBNP D-Dimer No results for input(s): DDIMER in the last 72 hours. Hemoglobin A1C No results for input(s): HGBA1C in the last 72 hours. Fasting Lipid Panel No results for input(s): CHOL, HDL, LDLCALC, TRIG, CHOLHDL, LDLDIRECT in the last 72 hours. Thyroid Function Tests No results for input(s): TSH, T4TOTAL, T3FREE, THYROIDAB in the last 72 hours.  Invalid input(s): FREET3 _____________  CT ABDOMEN PELVIS WO CONTRAST  Result Date: 08/28/2020 CLINICAL DATA:  Gross hematuria. EXAM: CT ABDOMEN AND PELVIS WITHOUT CONTRAST TECHNIQUE: Multidetector CT imaging of the abdomen and pelvis was performed following the standard protocol without IV contrast. COMPARISON:  None. FINDINGS: Lower chest: Mild bilateral posterior basilar subsegmental atelectasis is noted. Mild pericardial effusion is noted. Hepatobiliary: Cholelithiasis is noted. No biliary dilatation is noted. The liver is unremarkable. Pancreas: Unremarkable. No pancreatic ductal dilatation or surrounding inflammatory changes. Spleen: Normal in size without focal abnormality. Adrenals/Urinary Tract: Adrenal glands appear normal. Right renal cyst is noted. Nonobstructive bilateral nephrolithiasis is noted. Right renal cyst is noted. No hydronephrosis or renal obstruction is noted. Urinary bladder is unremarkable. Stomach/Bowel: Stomach is within normal limits. Appendix appears normal. No evidence of bowel wall thickening, distention, or inflammatory changes. Vascular/Lymphatic: Aortic atherosclerosis. No enlarged abdominal or pelvic lymph nodes. Reproductive: Prostate is unremarkable. Other: Moderate size fat containing periumbilical hernia is noted. No ascites is noted. Musculoskeletal: No acute or significant osseous findings. IMPRESSION: Bilateral nonobstructive nephrolithiasis. No hydronephrosis or renal obstruction is noted. Cholelithiasis. Moderate size  fat containing periumbilical hernia. Mild pericardial effusion. Aortic Atherosclerosis (ICD10-I70.0). Electronically Signed   By: Marijo Conception M.D.   On: 08/28/2020 18:38   DG Chest 1 View  Result Date: 09/02/2020 CLINICAL DATA:  Shortness of breath EXAM: CHEST  1 VIEW COMPARISON:  08/29/2020 FINDINGS: Midline trachea. Cardiomegaly accentuated by AP portable technique. No pleural effusion or pneumothorax. Suspect developing bibasilar airspace disease. Upper lungs clear. IMPRESSION: Worsening bibasilar aeration, with suspicion of developing atelectasis versus infection. Cardiomegaly without congestive failure. Electronically Signed   By: Abigail Miyamoto M.D.   On: 09/02/2020 09:12   DG Chest 1 View  Result Date: 08/29/2020 CLINICAL DATA:  Tachycardia and shortness of breath. EXAM: CHEST  1 VIEW COMPARISON:  08/27/2020 FINDINGS: The heart  is upper limits of normal in size given the AP projection and portable technique. No acute pulmonary findings. The bony structures are intact. IMPRESSION: No acute cardiopulmonary findings. Electronically Signed   By: Marijo Sanes M.D.   On: 08/29/2020 09:49   CT HEAD WO CONTRAST  Result Date: 08/30/2020 CLINICAL DATA:  Altered mental status EXAM: CT HEAD WITHOUT CONTRAST TECHNIQUE: Contiguous axial images were obtained from the base of the skull through the vertex without intravenous contrast. COMPARISON:  08/27/2019 FINDINGS: Brain: Old bilateral occipital infarcts. Appearance is stable. There is atrophy and chronic small vessel disease changes. No acute intracranial abnormality. Specifically, no hemorrhage, hydrocephalus, mass lesion, acute infarction, or significant intracranial injury. Vascular: No hyperdense vessel or unexpected calcification. Skull: No acute calvarial abnormality. Sinuses/Orbits: No acute findings Other: None IMPRESSION: Remote infarcts in the occipital lobes. Atrophy, chronic microvascular disease. No acute intracranial abnormality.  Electronically Signed   By: Rolm Baptise M.D.   On: 08/30/2020 16:11   MR BRAIN WO CONTRAST  Result Date: 09/01/2020 CLINICAL DATA:  Initial evaluation for neuro deficit, stroke suspected. EXAM: MRI HEAD WITHOUT CONTRAST TECHNIQUE: Multiplanar, multiecho pulse sequences of the brain and surrounding structures were obtained without intravenous contrast. COMPARISON:  Comparison made with prior CT from 08/30/2020 as well as previous MRI from 01/26/2020. FINDINGS: Brain: Diffuse prominence of the CSF containing spaces compatible with generalized age-related cerebral atrophy, moderately advanced in nature. Patchy and confluent T2/FLAIR hyperintensity involving the periventricular white matter most consistent with chronic microvascular ischemic disease, mild in nature. Mild patchy involvement of the pons noted as well. Few scattered superimposed remote lacunar infarcts present at the left anterior genu of the corpus callosum, white matter of the bilateral centrum semi ovale, thalami, and brainstem. Few additional small remote cerebellar infarcts noted. Prominent areas of encephalomalacia and gliosis involving the bilateral occipital lobes consistent with chronic PCA territory infarcts. Persistent diffusion abnormality seen about these chronic infarcts, right greater than left, similar to previous exam, and most likely related to chronic underlying blood products and/or laminar necrosis. No abnormal foci of restricted diffusion to suggest acute or subacute ischemia elsewhere within the brain. Gray-white matter differentiation otherwise maintained. No acute intracranial hemorrhage. Few scattered chronic micro hemorrhages noted about the cerebellum and deep gray nuclei, most likely related to chronic poorly controlled hypertension, similar to previous. No mass lesion, midline shift or mass effect. Diffuse ventricular prominence related to global parenchymal volume loss without hydrocephalus. No extra-axial fluid  collection. Pituitary gland and suprasellar region normal. Midline structures intact. Vascular: Major intracranial vascular flow voids are maintained and stable in appearance. Susceptibility artifact related to left V4 segment stent again noted. Hypoplastic right vertebral artery with irregular flow void noted as well. Skull and upper cervical spine: Craniocervical junction within normal limits. Mild spondylosis noted at C3-4 without significant stenosis. Remainder the visualized upper cervical spine otherwise unremarkable. Bone marrow signal intensity within normal limits. No scalp soft tissue abnormality. Sinuses/Orbits: Globes and orbital soft tissues demonstrate no acute finding. Mild scattered mucosal thickening noted within the ethmoidal air cells and right maxillary sinus. Small bilateral mastoid effusions noted, of doubtful significance. Visualized nasopharynx unremarkable. Inner ear structures grossly within normal limits. Other: None. IMPRESSION: 1. No acute intracranial infarct or other abnormality. 2. Chronic bilateral PCA territory infarcts, stable. 3. Underlying age-related cerebral atrophy with chronic microvascular ischemic disease, with multiple additional remote lacunar infarcts as above, stable. Electronically Signed   By: Jeannine Boga M.D.   On: 09/01/2020 00:59   NM Pulmonary  Perfusion  Result Date: 08/28/2020 CLINICAL DATA:  Suspected P. Positive D-dimer. EXAM: NUCLEAR MEDICINE PERFUSION LUNG SCAN TECHNIQUE: Perfusion images were obtained in multiple projections after intravenous injection of radiopharmaceutical. Ventilation scans intentionally deferred if perfusion scan and chest x-ray adequate for interpretation during COVID 19 epidemic. RADIOPHARMACEUTICALS:  4.4 mCi Tc-52mMAA IV COMPARISON:  Chest x-ray 08/27/2020. FINDINGS: Findings again noted consistent with cardiomegaly. A faint perfusion defect on the left cannot be excluded. Pulmonary embolus cannot be excluded.  IMPRESSION: Cardiomegaly. A faint perfusion defect on the left cannot be excluded. Pulmonary embolus cannot be excluded. Electronically Signed   By: TMarcello Moores Register   On: 08/28/2020 10:00   DG Chest Port 1 View  Result Date: 08/27/2020 CLINICAL DATA:  Dyspnea. EXAM: PORTABLE CHEST 1 VIEW COMPARISON:  July 31, 2019. FINDINGS: The heart size and mediastinal contours are within normal limits. Both lungs are clear. The visualized skeletal structures are unremarkable. IMPRESSION: No active disease. Electronically Signed   By: JMarijo ConceptionM.D.   On: 08/27/2020 09:34   ECHOCARDIOGRAM COMPLETE  Result Date: 08/27/2020    ECHOCARDIOGRAM REPORT   Patient Name:   Raymond PURTEEDate of Exam: 08/27/2020 Medical Rec #:  [id number]   Height:       70.0 in Accession #:    [id number]  Weight:       208.0 lb Date of Birth:  [birth year]   BSA:          2.122 m Patient Age:    [age]years     BP:           98/67 mmHg Patient Gender: M            HR:           82 bpm. Exam Location:  Inpatient Procedure: 2D Echo, Cardiac Doppler and Color Doppler Indications:    R07.9* Chest pain, unspecified  History:        Patient has prior history of Echocardiogram examinations, most                 recent 08/27/2019. Previous Myocardial Infarction, Stroke; Risk                 Factors:Dyslipidemia and Diabetes.  Sonographer:    TJonelle SidleDance Referring Phys: [id number]. Left ventricular ejection fraction, by estimation, is 40 to 45%. The left ventricle has mildly decreased function. The left ventricle demonstrates regional wall motion abnormalities with apical hypokinesis. Left ventricular diastolic parameters are consistent with Grade I diastolic dysfunction (impaired relaxation).  2. Right ventricular systolic function is normal. The right ventricular size is normal. Tricuspid regurgitation signal is inadequate for assessing PA pressure.  3. Left atrial size was mildly dilated.  4. The mitral valve is normal in  structure. No evidence of mitral valve regurgitation. No evidence of mitral stenosis.  5. The aortic valve is tricuspid. Aortic valve regurgitation is trivial. Mild aortic valve sclerosis is present, with no evidence of aortic valve stenosis.  6. The inferior vena cava is dilated in size with >50% respiratory variability, suggesting right atrial pressure of 8 mmHg.  7. A small pericardial effusion is present. There does appear to be respirophasic variation of the interventricular septum visually, and the mitral valve E inflow doppler velocity varies > 25% with inspiration. I do not think that there is pericardial tamponade, however there may be a component of pericardial constriction. FINDINGS  Left Ventricle: Left ventricular ejection  fraction, by estimation, is 40 to 45%. The left ventricle has mildly decreased function. The left ventricle demonstrates regional wall motion abnormalities. The left ventricular internal cavity size was normal in size. There is no left ventricular hypertrophy. Left ventricular diastolic parameters are consistent with Grade I diastolic dysfunction (impaired relaxation). Right Ventricle: The right ventricular size is normal. No increase in right ventricular wall thickness. Right ventricular systolic function is normal. Tricuspid regurgitation signal is inadequate for assessing PA pressure. Left Atrium: Left atrial size was mildly dilated. Right Atrium: Right atrial size was normal in size. Pericardium: A small pericardial effusion is present. Mitral Valve: The mitral valve is normal in structure. Mild mitral annular calcification. No evidence of mitral valve regurgitation. No evidence of mitral valve stenosis. Tricuspid Valve: The tricuspid valve is normal in structure. Tricuspid valve regurgitation is not demonstrated. Aortic Valve: The aortic valve is tricuspid. Aortic valve regurgitation is trivial. Aortic regurgitation PHT measures 469 msec. Mild aortic valve sclerosis is present,  with no evidence of aortic valve stenosis. Pulmonic Valve: The pulmonic valve was normal in structure. Pulmonic valve regurgitation is trivial. Aorta: The aortic root is normal in size and structure. Venous: The inferior vena cava is dilated in size with greater than 50% respiratory variability, suggesting right atrial pressure of 8 mmHg. IAS/Shunts: No atrial level shunt detected by color flow Doppler.  LEFT VENTRICLE PLAX 2D LVIDd:         4.90 cm  Diastology LVIDs:         4.10 cm  LV e' medial:    8.38 cm/s LV PW:         0.90 cm  LV E/e' medial:  8.5 LV IVS:        1.10 cm  LV e' lateral:   6.64 cm/s LVOT diam:     1.90 cm  LV E/e' lateral: 10.7 LV SV:         61 LV SV Index:   29 LVOT Area:     2.84 cm  RIGHT VENTRICLE             IVC RV Basal diam:  2.60 cm     IVC diam: 2.50 cm RV S prime:     12.60 cm/s TAPSE (M-mode): 1.8 cm LEFT ATRIUM             Index       RIGHT ATRIUM           Index LA diam:        4.30 cm 2.03 cm/m  RA Area:     11.30 cm LA Vol (A2C):   81.6 ml 38.45 ml/m RA Volume:   20.50 ml  9.66 ml/m LA Vol (A4C):   49.8 ml 23.46 ml/m LA Biplane Vol: 66.6 ml 31.38 ml/m  AORTIC VALVE LVOT Vmax:   101.00 cm/s LVOT Vmean:  74.400 cm/s LVOT VTI:    0.214 m AI PHT:      469 msec  AORTA Ao Root diam: 3.40 cm Ao Asc diam:  3.70 cm MITRAL VALVE MV Area (PHT): 3.91 cm    SHUNTS MV Decel Time: 194 msec    Systemic VTI:  0.21 m MV E velocity: 71.30 cm/s  Systemic Diam: 1.90 cm MV A velocity: 81.50 cm/s MV E/A ratio:  0.87 Loralie Champagne MD Electronically signed by Loralie Champagne MD Signature Date/Time: 08/27/2020/4:56:49 PM    Final    ECHOCARDIOGRAM LIMITED  Result Date: 08/31/2020    ECHOCARDIOGRAM LIMITED REPORT  Patient Name:   [name] Date of Exam: 08/31/2020 Medical Rec #:  [id number]    Height:       70.0 in Accession #:    [id number]   Weight:       226.4 lb Date of Birth:  04-Jun-1950    BSA:          2.200 m Patient Age:    34 years     BP:           111/68 mmHg Patient Gender: M             HR:           73 bpm. Exam Location:  Inpatient Procedure: Limited Echo, Cardiac Doppler and Color Doppler Indications:    Pericardial effusion  History:        Patient has prior history of Echocardiogram examinations.                 Previous Myocardial Infarction, Arrythmias:Atrial Fibrillation;                 Risk Factors:Dyslipidemia and Hypertension. Stroke.  Sonographer:    Clayton Lefort RDCS (AE) Referring Phys: [place]  Sonographer Comments: Dr. Daneen Schick bedside during echo. IMPRESSIONS  1. Anterolateral and posterolateral hypokinesis. Left ventricular ejection fraction, by estimation, is 40 to 45%. The left ventricle has mildly decreased function. The left ventricle demonstrates regional wall motion abnormalities (see scoring diagram/findings for description).  2. Right ventricular systolic function is normal. The right ventricular size is normal.  3. Left atrial size was mildly dilated.  4. Moderate pericardial effusion measuring 0.5 cm anteriorly and 1.3 cm posteriorly. Unchanged from 08/27/20. Moderate pericardial effusion. The pericardial effusion is circumferential. There is no evidence of cardiac tamponade.  5. The mitral valve is normal in structure. Trivial mitral valve regurgitation. No evidence of mitral stenosis.  6. The aortic valve is tricuspid. Aortic valve regurgitation is not visualized. No aortic stenosis is present.  7. The inferior vena cava is normal in size with greater than 50% respiratory variability, suggesting right atrial pressure of 3 mmHg. FINDINGS  Left Ventricle: Anterolateral and posterolateral hypokinesis. Left ventricular ejection fraction, by estimation, is 40 to 45%. The left ventricle has mildly decreased function. The left ventricle demonstrates regional wall motion abnormalities. Right Ventricle: The right ventricular size is normal. No increase in right ventricular wall thickness. Right ventricular systolic function is normal. Left Atrium: Left atrial  size was mildly dilated. Right Atrium: Right atrial size was normal in size. Pericardium: Moderate pericardial effusion measuring 0.5 cm anteriorly and 1.3 cm posteriorly. Unchanged from 08/27/20. A moderately sized pericardial effusion is present. The pericardial effusion is circumferential. There is excessive respiratory variation in the tricuspid valve spectral Doppler velocities. There is no evidence of cardiac tamponade. Mitral Valve: The mitral valve is normal in structure. Mild mitral annular calcification. Trivial mitral valve regurgitation. No evidence of mitral valve stenosis. Tricuspid Valve: The tricuspid valve is grossly normal. Aortic Valve: The aortic valve is tricuspid. Aortic valve regurgitation is not visualized. No aortic stenosis is present. Pulmonic Valve: The pulmonic valve was grossly normal. Pulmonic valve regurgitation is trivial. Venous: The inferior vena cava is normal in size with greater than 50% respiratory variability, suggesting right atrial pressure of 3 mmHg. Skeet Latch MD Electronically signed by Skeet Latch MD Signature Date/Time: 08/31/2020/4:59:41 PM    Final     Discharge Instructions:  [name] you were recently hospitalized at [facility] for what we call an "atrial fibrillation". This means that you had irregular heart beats which can sometimes make it difficult for you to breath. We think that you went into this irregular heart beat because of the recent diarrehal illness you had on the 23/24th. That caused you to have a collection of fluid around your heart and we think this fluid collection influenced this irregular heart rhythm. While you were with [place] we have been treating you with a medication for your irregular heart beat and you converted to you normal/regular heart rate when you were discharged. We also treated you with a medication called colchicine for that fluid collection around your heart. Please continue to take this medication for 3  months twice daily. Also make sure to have a visit with your primary care doctor in about 1 months so they can do an echocardiogram of your heart to make sure that the fluid collection around your heart is getting smaller. We have also started you on a medication called dapagliflozin for your heart failure. Like we talked about some symptoms to look out for with that medication include: increased urinary frequency, urgency and pain with urination. Let your primary care doctor know if you have any of these symptoms. We also started you on a blood thinner called apixiban. This medication will help preventing blood clots and your primary care doctor may adjust it when you see then at your next appointment. Lastly, continue using that ointment we gave your for that itchy rash on your back.  Otherwise, please let us know if you have any other concerns or questions. It was a pleasure taking care of you and we wish you a speedy recovery!    Discharge Instructions     Call MD for:  difficulty breathing, headache or visual disturbances   Complete by: As directed    Call MD for:  extreme fatigue   Complete by: As directed    Call MD for:  persistant dizziness or light-headedness   Complete by: As directed    Call MD for:  persistant nausea and vomiting   Complete by: As directed    Call MD for:  severe uncontrolled pain   Complete by: As directed    Call MD for:  temperature >100.4   Complete by: As directed    Diet - low sodium heart healthy   Complete by: As directed    Discharge instructions   Complete by: As directed    Mr [name],  You were admitted to the hospital with chest pain and shortness and breath. You were noted to have an abnormal heart rhythm (atrial fibrillation) due to pericarditis (inflammation around your heart). We believe this was in the setting of a recent viral illness. You were treated with colchicine to help with the inflammation around your heart. You will need to  continue this for three months. Your carvedilol was increased to help control the abnormal rhythm and you were started on a blood thinner medication to help prevent blood clots. On discharge, please continue to take all medications as prescribed. Please follow up with your PCP within one week. Follow up with the cardiologist as scheduled for further monitoring and adjustment of your medications.    Thank you!   Increase activity slowly   Complete by: As directed        Signed:   Attestation for Student Documentation:  I personally was present and performed or re-performed the history, physical exam and medical decision-making  activities of this service and have verified that the service and findings are accurately documented in the student's note.  Harvie Heck, MD 09/06/2020, 12:38 PM

## 2020-09-04 NOTE — Progress Notes (Signed)
RN gave pt's wife discharge instructions and she stated understanding, IV and tele have been removed. New prescriptions escribed to home pharmacy.

## 2020-09-04 NOTE — Progress Notes (Signed)
Subjective: ON: Patient noted to have diffuse pruritic rash on back. Was treated with topical 1% hydrocortisone cream. He was also bradycardic down to 40 bpm thus we held his morning dose of carvedilol.   This am: Patient sitting up in bed. Report no concerns this morning. Wearing [place], does not endorse shortness of breath. He is tolerating food ok. Did eat his dinner last night but did not eat breakfast this morning. He is agreeable to working with PT for home health. His rash this morning is doing better, improved itchiness. Pt appears euvolemic on exam and is in sinus rhythm.   Objective: Physical exam: General: Well-appearing man in no acute distress.  CV: RRR with no murmurs.  Lungs: Normal work of breathing.  Extremities: No LEE. Skin: Diffuse, blanchable erythematous macules on pressure areas of the back.   Vital signs in last 24 hours: Vitals:   09/03/20 1217 09/03/20 1705 09/04/20 0105 09/04/20 0518  BP: 105/74 114/62 118/76 124/82  Pulse: 68 (!) 40 81 73  Resp: 16 16 18 18   Temp: 98.6 F (37 C) 97.8 F (36.6 C) 99.2 F (37.3 C) 98 F (36.7 C)  TempSrc: Oral Oral Oral Oral  SpO2: 100% 100% 95% 95%  Weight:    97.8 kg  Height:       Weight change: -0.2 kg No intake or output data in the 24 hours ending 09/04/20 1002  CBC Latest Ref Rng & Units 09/04/2020 09/03/2020 09/02/2020  WBC 4.0 - 10.5 K/uL 8.9 10.1 9.7  Hemoglobin 13.0 - 17.0 g/dL 12.4(L) 11.5(L) 11.0(L)  Hematocrit 39.0 - 52.0 % 37.2(L) 34.6(L) 32.5(L)  Platelets 150 - 400 K/uL 401(H) 376 311   BMP Latest Ref Rng & Units 09/04/2020 09/03/2020 09/02/2020  Glucose 70 - 99 mg/dL 09/04/2020) 409(W) 119(J)  BUN 8 - 23 mg/dL 478(G) 95(A) 21(H)  Creatinine 0.61 - 1.24 mg/dL 08(M) 5.78(I) 6.96(E)  Sodium 135 - 145 mmol/L 136 137 138  Potassium 3.5 - 5.1 mmol/L 3.7 3.4(L) 4.0  Chloride 98 - 111 mmol/L 104 105 110  CO2 22 - 32 mmol/L 24 25 20(L)  Calcium 8.9 - 10.3 mg/dL 9.52(W) 4.1(L) 2.4(M)    Assessment/Plan:  Principal  Problem:   Acute pericarditis Active Problems:   Dyslipidemia   DM II (diabetes mellitus, type II), controlled (HCC)   Atrial fibrillation with RVR (HCC)   Acute-on-chronic kidney injury (HCC)   Dyspnea   Chest pain   Pericardial effusion  69 yoM w/ long pmhx notable for MI, CVA, HLD, T2DM and CKD admitted to the IMT[facility] for management of paroxysmal atrial fibrillation with RVR 2/2 postviral pericarditis and resolving AKI.  Paroxysmal atrial fibrillation w/ RVR 2/2 postviral pericarditis Acute hypoxic respiratory failure Pt w/ paroxysmal a.fib w/RVR 2/2 postviral pericarditis being treated with colchicine 0.6mg  bid. Now in sinus rhythm, last HR of 73 bpm, on carvedilol 25mg  bid. Pt did have episode of bradycardia ON.  -decrease carvedilol dose from 25mg  bid to 12.5 bid.  -continue colchicine 0.6mg  bid for 3 months -continue abixaban 5mg  bid; discharge with 3 month supply and can reassess further need in outpatient setting, can consider Ziopatch to reassess reoccurrence of a.fib -daily weights and I/Os -Outpatient: f/u echo in a month, if a.fib is recurrent can consider EP evaluation and sleep study   Diffuse erythematous pruritic macules 2/2 irritant contact dermatitis: Pt with new diffuse rash on his back likely from contact dermatitis from irritation of areas that come in contact with bed/chair. Will monitor for  improvement. -cont topical 1% hydrocortisone cream  -encourage pt to be OOB and in chair or walking  Improving AKI on CKD Stage 3a: Bun and creatinine stable today at 24 and 1.62 respectively. Appears euvolemic on exam.  -BMP tomorrow -avoiding nephrotoxic agents  Physical deconditioning Resolved posterior and R lateral lean and R foot drag Awaiting for assessment by PT this afternoon. Goal is to improve strength while inpatient and to be discharged home with HHPT.  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  Currently on carvedilol, aspirin, statin and started on SGLT2i during hospitalization. Was transitioned from lasix 40 mg to 20 mg yesterday.  -Cont lasix 20 mg PO  -Outpatient: consider ischemic disease work up [facility]) and after improvement of AKI consider adding ACE/ARB/ARNI and/or spironolactone/Aldactone for improved HFmrEF management    HLD: Well controlled. On home atorvastatin.   HTN: Pt continues to have stable BP. Carvedilol decreased to 12.5mg  bid this am.    T2DM: Diet controlled diabetes. Last A1c was 5.9 on 03/29/20.   Gout: Last flare up in 08/2019. Patient receiving colchicine for treatment of pericarditis. Can resume home colchicine after current course.    Diet: Heart Healthy diet  VTE: apixaban 5mg  daily  Code: Full PT/OT recs: SNF for Subacute PT, bedside commode. TOC recs: SNF Family Update: Will speak with family this afternoon.    Prior to Admission Living Arrangement: Home Anticipated Discharge Location: SNF or home with HHPT Barriers to Discharge: Continued medical management Dispo: Anticipated discharge to Skilled nursing facility or home in 2-3 days pending clinical improvement.    LOS: 6 days   T, Medical Student 09/04/2020, 10:02 AM

## 2020-09-04 NOTE — TOC Initial Note (Addendum)
Transition of Care [facility]) - Initial/Assessment Note    Patient Details  Name: [name] MRN: [id number] Date of Birth: 03-25-1950  Transition of Care [facility]) CM/SW Contact:    Kingsley Plan, RN Phone Number: 09/04/2020, 12:57 PM  Clinical Narrative:                 PT recommending HHPT and 3 in 1 . Spoke to patient and wife at bedside. Both in agreement. Provided medicare.gov list of home health agencies. Wife would like [place]. Cory with [facility] accepted referral for HHPT and HHRN start of care Thursday.   Patient has walker at home.   Ordered 3 in1 , waiting on ambulation oxygen saturation note to see if home oxygen needed before calling [facility]    1354 no oxygen needed. Jackelyn Knife with [facility] for 3 in 1  Expected Discharge Plan: Home w Home Health Services Barriers to Discharge: Continued Medical Work up, English as a second language teacher, SNF Pending bed offer   Patient Goals and CMS Choice Patient states their goals for this hospitalization and ongoing recovery are:: to return to home CMS Medicare.gov Compare Post Acute Care list provided to:: Patient Choice offered to / list presented to : Patient, Spouse  Expected Discharge Plan and Services Expected Discharge Plan: Home w Home Health Services In-house Referral: Clinical Social Work   Post Acute Care Choice: Home Health, Durable Medical Equipment Living arrangements for the past 2 months: Single Family Home                 DME Arranged: 3-N-1       Representative spoke with at DME Agency: waiting on oxygen note will call once know if home oxygen needed HH Arranged: RN, PT HH Agency: [facility] Date Loc [facility] Agency Contacted: 09/04/20 Time HH Agency Contacted: 1256 Representative spoke with at [facility] Agency: Kandee Keen  Prior Living Arrangements/Services Living arrangements for the past 2 months: Single Family Home Lives with:: Spouse Patient language and need for interpreter reviewed:: Yes Do you feel safe going  back to the place where you live?: Yes      Need for Family Participation in Patient Care: Yes (Comment) Care giver support system in place?: Yes (comment) Current home services: DME Criminal Activity/Legal Involvement Pertinent to Current Situation/Hospitalization: No - Comment as needed  Activities of Daily Living Home Assistive Devices/Equipment: None ADL Screening (condition at time of admission) Patient's cognitive ability adequate to safely complete daily activities?: Yes Is the patient deaf or have difficulty hearing?: No Does the patient have difficulty seeing, even when wearing glasses/contacts?: Yes Does the patient have difficulty concentrating, remembering, or making decisions?: No Patient able to express need for assistance with ADLs?: Yes Does the patient have difficulty dressing or bathing?: No Independently performs ADLs?: Yes (appropriate for developmental age) Does the patient have difficulty walking or climbing stairs?: Yes Weakness of Legs: None Weakness of Arms/Hands: None  Permission Sought/Granted   Permission granted to share information with : Yes, Verbal Permission Granted  Share Information with NAME: wife [name]  Permission granted to share info w AGENCY: [facility]        Emotional Assessment Appearance:: Appears stated age Attitude/Demeanor/Rapport: Engaged Affect (typically observed): Accepting Orientation: : Oriented to Self, Oriented to Place, Oriented to  Time, Oriented to Situation Alcohol / Substance Use: Not Applicable Psych Involvement: No (comment)  Admission diagnosis:  Atrial fibrillation with RVR (HCC) [I48.91] Dyspnea, unspecified type [R06.00] Chest pain, unspecified type [R07.9] Patient Active Problem List  Diagnosis Date Noted   Acute pericarditis 09/01/2020   Pericardial effusion 09/01/2020   Chest pain    Acute-on-chronic kidney injury (HCC) 08/28/2020   Dyspnea    Atrial fibrillation with RVR (HCC) 08/27/2020   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) 09/01/2019   Altered mental status 08/30/2019   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/27/2019   Palliative care by specialist    Goals of care, counseling/discussion    Stroke (HCC) 08/26/2019   Slurred speech 08/26/2019   Dyslipidemia 08/26/2019   CKD (chronic kidney disease), stage III (HCC) 08/26/2019   DM II (diabetes mellitus, type II), controlled (HCC) 08/26/2019   Cerebrovascular disease 08/26/2019   PCP:  Katherine Basset, PA-C Pharmacy:   [facility] [phone] - [place], [place] - [address] AT [facility] [address] [place]  [address] Phone: [phone] Fax: [phone]     Social Determinants of Health (SDOH) Interventions    Readmission Risk Interventions No flowsheet data found.

## 2020-09-04 NOTE — Progress Notes (Addendum)
Patient is a 70 year old man hospitalized on 08/27/20 with Afib with RVR due to viral pericarditis. He is taking colchicine 0.6mg  BID for 30 days and then to resume home dose for gout. On apixaban/Eliquis 5mg  BID for Afib. Patient has HFmrEF with EF 40-45% being treated with carvedilol/Coreg 25 mg BID, atorvastatin/Lipitor 80mg  once daily, dapagliflozin/Farxiga 10mg  once daily, and furosemide/Lasix 20mg  once daily. Patient has an improving AKI with stage 3a CKD. We may consider adding on ACE/ARB/ARNI and/or spironolactone/Aldactone once the patient's renal function is stable due to improved mortality/morbidity benefit in HFmrEF per the 2022 AHA/ACC Heart Failure Guideline Updates. Have reviewed note by Pharmacy Student.

## 2020-09-04 NOTE — TOC Benefit Eligibility Note (Signed)
Patient Product/process development scientist completed.    The patient is currently admitted and upon discharge could be taking Entresto 24/26 mg.  The current 30 day co-pay is, $45.00.   The patient is currently admitted and upon discharge could be taking Jardiance 10 mg.  The current 30 day co-pay is, $45.00.   The patient is currently admitted and upon discharge could be taking Mitigare (Colchicine) 0.6 mg.  The current 30 day co-pay is, $45.00.   The patient is currently admitted and upon discharge could be taking Farxiga 10 mg.  The current 30 day co-pay is, $95.00.   The patient is insured through [employer] Medicare Part D    Roland Earl, CPhT Pharmacy Patient Advocate Specialist [facility] Antimicrobial Stewardship Team Direct Number: [phone]  Fax: [phone]

## 2020-09-04 NOTE — Progress Notes (Signed)
Progress Note  Patient Name: [name] Date of Encounter: 09/04/2020  [facility] Cardiologist: Lesleigh Noe, MD   Subjective   Doing well today. Up in the chair, ambulated with PT yesterday.  No further chest pain.  Inpatient Medications    Scheduled Meds:  apixaban  5 mg Oral BID   aspirin EC  81 mg Oral Daily   atorvastatin  80 mg Oral q1800   carvedilol  12.5 mg Oral BID WC   colchicine  0.6 mg Oral BID   dapagliflozin propanediol  10 mg Oral Daily   FLUoxetine  10 mg Oral Daily   furosemide  20 mg Oral Daily   hydrocortisone cream   Topical BID   multivitamin with minerals  1 tablet Oral Daily   Continuous Infusions:  promethazine (PHENERGAN) injection (IM or IVPB)     PRN Meds: acetaminophen, ipratropium-albuterol, phenol, polyethylene glycol **OR** senna, promethazine (PHENERGAN) injection (IM or IVPB)   Vital Signs    Vitals:   09/03/20 1217 09/03/20 1705 09/04/20 0105 09/04/20 0518  BP: 105/74 114/62 118/76 124/82  Pulse: 68 (!) 40 81 73  Resp: 16 16 18 18   Temp: 98.6 F (37 C) 97.8 F (36.6 C) 99.2 F (37.3 C) 98 F (36.7 C)  TempSrc: Oral Oral Oral Oral  SpO2: 100% 100% 95% 95%  Weight:    97.8 kg  Height:       No intake or output data in the 24 hours ending 09/04/20 0949  Last 3 Weights 09/04/2020 09/03/2020 09/02/2020  Weight (lbs) 215 lb 9.8 oz 216 lb 0.8 oz 228 lb 6.3 oz  Weight (kg) 97.8 kg 98 kg 103.6 kg      Telemetry    SR ( last episode of Afib morning of 7/31), PACs- Personally Reviewed  ECG    No new tracing this morning  Physical Exam   GEN: No acute distress. Sitting up in chair, on [place] @2L  Neck: No JVD Cardiac: RRR, no murmurs, rubs, or gallops.  Respiratory: Clear to auscultation bilaterally. GI: Soft, nontender, non-distended  MS: No edema; No deformity. Neuro:  Nonfocal  Psych: Normal affect   Labs    High Sensitivity Troponin:   Recent Labs  Lab 08/27/20 0907 08/27/20 1107  TROPONINIHS 27* 23*       Chemistry Recent Labs  Lab 09/02/20 0157 09/03/20 0348 09/04/20 0432  NA 138 137 136  K 4.0 3.4* 3.7  CL 110 105 104  CO2 20* 25 24  GLUCOSE 119* 133* 171*  BUN 24* 28* 24*  CREATININE 1.48* 1.69* 1.62*  CALCIUM 8.2* 8.2* 8.4*  GFRNONAA 51* 43* 46*  ANIONGAP 8 7 8      Hematology Recent Labs  Lab 09/02/20 0157 09/03/20 0348 09/04/20 0432  WBC 9.7 10.1 8.9  RBC 3.41* 3.65* 3.86*  HGB 11.0* 11.5* 12.4*  HCT 32.5* 34.6* 37.2*  MCV 95.3 94.8 96.4  MCH 32.3 31.5 32.1  MCHC 33.8 33.2 33.3  RDW 15.0 14.9 15.0  PLT 311 376 401*    BNPNo results for input(s): BNP, PROBNP in the last 168 hours.   DDimer No results for input(s): DDIMER in the last 168 hours.   Radiology    No results found.  Cardiac Studies   Echo: 08/31/20  IMPRESSIONS     1. Anterolateral and posterolateral hypokinesis. Left ventricular  ejection fraction, by estimation, is 40 to 45%. The left ventricle has  mildly decreased function. The left ventricle demonstrates regional wall  motion  abnormalities (see scoring  diagram/findings for description).   2. Right ventricular systolic function is normal. The right ventricular  size is normal.   3. Left atrial size was mildly dilated.   4. Moderate pericardial effusion measuring 0.5 cm anteriorly and 1.3 cm  posteriorly. Unchanged from 08/27/20. Moderate pericardial effusion. The  pericardial effusion is circumferential. There is no evidence of cardiac  tamponade.   5. The mitral valve is normal in structure. Trivial mitral valve  regurgitation. No evidence of mitral stenosis.   6. The aortic valve is tricuspid. Aortic valve regurgitation is not  visualized. No aortic stenosis is present.   7. The inferior vena cava is normal in size with greater than 50%  respiratory variability, suggesting right atrial pressure of 3 mmHg.   FINDINGS   Left Ventricle: Anterolateral and posterolateral hypokinesis. Left  ventricular ejection fraction, by  estimation, is 40 to 45%. The left  ventricle has mildly decreased function. The left ventricle demonstrates  regional wall motion abnormalities.   Right Ventricle: The right ventricular size is normal. No increase in  right ventricular wall thickness. Right ventricular systolic function is  normal.   Left Atrium: Left atrial size was mildly dilated.   Right Atrium: Right atrial size was normal in size.   Pericardium: Moderate pericardial effusion measuring 0.5 cm anteriorly and  1.3 cm posteriorly. Unchanged from 08/27/20. A moderately sized pericardial  effusion is present. The pericardial effusion is circumferential. There is  excessive respiratory  variation in the tricuspid valve spectral Doppler velocities. There is no  evidence of cardiac tamponade.   Mitral Valve: The mitral valve is normal in structure. Mild mitral annular  calcification. Trivial mitral valve regurgitation. No evidence of mitral  valve stenosis.   Tricuspid Valve: The tricuspid valve is grossly normal.   Aortic Valve: The aortic valve is tricuspid. Aortic valve regurgitation is  not visualized. No aortic stenosis is present.   Pulmonic Valve: The pulmonic valve was grossly normal. Pulmonic valve  regurgitation is trivial.   Venous: The inferior vena cava is normal in size with greater than 50%  respiratory variability, suggesting right atrial pressure of 3 mmHg.   Patient Profile     70 y.o. male with PMH of MI s/p PCI in 1990, Ischemic cardiomyopathy with anterolateral WMA, HTN and DM, Prior CVA with basilar artery stenting and HLD who presented with CP and new AF  Assessment & Plan    Paroxysmal atrial fibrillation: Has had intermittent episodes during admission.  Currently in sinus rhythm with PACs this morning.  Treated with Coreg 25 mg twice daily along with Eliquis 5 mg twice daily.  Suspect his viral illness along with pericarditis contributing to recurrent episodes of A. fib. --At this time  would continue Coreg 25 mg twice daily along with Eliquis 5 mg twice daily as he is maintaining sinus rhythm.  If he develops recurrent episodes may need EP evaluation as an outpatient.  Ischemic cardiomyopathy: EF of 40 to 45%.  Treated with beta-blocker.  Not on ACE/ARB/Spiro given CKD, and need for colchicine. --Was started on SGLT2 --Given IV Lasix 40 mg yesterday with 4.6 L urine output.  Transitioned to Lasix 20 mg p.o. yesterday  Pericardial effusion/pericarditis: Was started on colchicine 0.6 mg twice daily with significant improvement in symptoms.  Deferred initiation of steroids given resolution of symptoms.  -- Limited echo 7/29 with unchanged moderate pericardial effusion --We will need to recheck 2D echo in 2 to 3 weeks as an outpatient.  CAD: Status post MI with stenting in '90.  Recommendations for outpatient work-up with Myoview  CKD stage IIIb: Creatinine stable at 1.6  Hypokalemia: K+ 3.4 --Supplement  Suspect he is nearing discharge, will arrange outpatient follow-up in the office with Dr. Katrinka Blazing.  Okay for discharge from our point of view.   For questions or updates, please contact [facility] Please consult [email or web address] for contact info under        Signed, Nanetta Batty, MD  09/04/2020, 9:49 AM    Agree with note by Laverda Page NP-C  Patient being seen for pericarditis started on colchicine with improvement in pain.  He does have moderate LV dysfunction.  Good diuresis on IV Lasix transitioning to p.o.  Currently in sinus rhythm on beta-blockade and Eliquis.  SGLT2 started.  Holding off on ACE/ARB for now.  If he maintains sinus rhythm probably stable for discharge tomorrow.  Runell Gess, M.D., FACP, [facility], [facility] [address] [place], [place]  [address]  [phone] 09/04/2020 9:49 AM

## 2020-09-04 NOTE — Progress Notes (Signed)
SATURATION QUALIFICATIONS: (This note is used to comply with regulatory documentation for home oxygen)  Patient Saturations on Room Air at Rest = 94%  Patient Saturations on Room Air while Ambulating = 94%  Patient Saturations on -- Liters of oxygen while Ambulating = --%

## 2020-09-04 NOTE — Progress Notes (Signed)
Physical Therapy Treatment Patient Details Name: [name] MRN: [id number] DOB: April 17, 1950 Today's Date: 09/04/2020    History of Present Illness Pt is a 70yo male who presents to [facility] ED on 7/25 with dizziness and SOB, found to have new atrial fibrillation with RVR. Xray no significant findings. CT abdomen/pelvis found mild pericardial effusion. MRI no acute findings. PMH: multiple CVA, DM, CKD, HTN.    PT Comments    Pt received in recliner, eager to mobilize and participate in therapy. He required min guard assist transfers, min guard assist ambulation 150' with RW, and min assist ascend/descend 3 steps with L rail. Mobilized on 2L with SpO2 94-96%. HR in 70s/80s with activity. Pt reports no dizziness and no SOB. Wife present and engaged in session. Significant improvement in mobility noted. Discharge recommendations updated to home health.   Follow Up Recommendations  Home health PT;Supervision/Assistance - 24 hour     Equipment Recommendations  3in1 (PT)    Recommendations for Other Services       Precautions / Restrictions Precautions Precautions: Fall    Mobility  Bed Mobility               General bed mobility comments: Pt up in recliner.    Transfers Overall transfer level: Needs assistance Equipment used: Rolling walker (2 wheeled) Transfers: Sit to/from Stand Sit to Stand: Min guard Stand pivot transfers: Min guard       General transfer comment: cues for sequencing, assist for safety  Ambulation/Gait Ambulation/Gait assistance: Min guard Gait Distance (Feet): 150 Feet Assistive device: Rolling walker (2 wheeled) Gait Pattern/deviations: Step-through pattern Gait velocity: decreased Gait velocity interpretation: 1.31 - 2.62 ft/sec, indicative of limited community ambulator General Gait Details: assist for safety, cues needed for safe speed, mobilized on 2L with SpO2 94-96%   Stairs Stairs: Yes Stairs assistance: Min assist Stair Management:  One rail Left;Sideways Number of Stairs: 3 General stair comments: cues for sequencing, assist to maintain balance   Wheelchair Mobility    Modified Rankin (Stroke Patients Only)       Balance Overall balance assessment: Needs assistance Sitting-balance support: Feet supported;No upper extremity supported Sitting balance-Leahy Scale: Good     Standing balance support: Bilateral upper extremity supported;During functional activity Standing balance-Leahy Scale: Poor Standing balance comment: reliant on external support                            Cognition Arousal/Alertness: Awake/alert Behavior During Therapy: WFL for tasks assessed/performed Overall Cognitive Status: History of cognitive impairments - at baseline                                 General Comments: history of multiple CVA      Exercises      General Comments General comments (skin integrity, edema, etc.): Wife present and engaged in session.      Pertinent Vitals/Pain Pain Assessment: No/denies pain    Home Living                      Prior Function            PT Goals (current goals can now be found in the care plan section) Acute Rehab PT Goals Patient Stated Goal: home Progress towards PT goals: Progressing toward goals    Frequency    Min 3X/week      PT Plan  Discharge plan needs to be updated;Frequency needs to be updated    Co-evaluation              AM-PAC PT "6 Clicks" Mobility   Outcome Measure  Help needed turning from your back to your side while in a flat bed without using bedrails?: A Little Help needed moving from lying on your back to sitting on the side of a flat bed without using bedrails?: A Little Help needed moving to and from a bed to a chair (including a wheelchair)?: A Little Help needed standing up from a chair using your arms (e.g., wheelchair or bedside chair)?: A Little Help needed to walk in hospital room?: A  Little Help needed climbing 3-5 steps with a railing? : A Little 6 Click Score: 18    End of Session Equipment Utilized During Treatment: Gait belt;Oxygen Activity Tolerance: Patient tolerated treatment well Patient left: in chair;with call bell/phone within reach;with chair alarm set;with family/visitor present Nurse Communication: Mobility status PT Visit Diagnosis: Unsteadiness on feet (R26.81);Muscle weakness (generalized) (M62.81);Difficulty in walking, not elsewhere classified (R26.2);Other symptoms and signs involving the nervous system (L27.517)     Time: 0017-4944 PT Time Calculation (min) (ACUTE ONLY): 27 min  Charges:  $Gait Training: 23-37 mins                     Aida Raider, [place]  Office # [phone] Pager [phone]    Ilda Foil 09/04/2020, 11:34 AM

## 2020-09-06 ENCOUNTER — Other Ambulatory Visit: Payer: Self-pay | Admitting: Internal Medicine

## 2020-09-06 MED ORDER — FUROSEMIDE 20 MG PO TABS: 20.0000 mg | ORAL_TABLET | Freq: Every day | ORAL | 0 refills | Status: AC

## 2020-09-13 ENCOUNTER — Other Ambulatory Visit (HOSPITAL_COMMUNITY): Payer: Medicare HMO

## 2020-09-13 ENCOUNTER — Ambulatory Visit (HOSPITAL_COMMUNITY): Admission: RE | Admit: 2020-09-13 | Payer: Medicare HMO | Source: Ambulatory Visit

## 2020-09-13 ENCOUNTER — Telehealth: Payer: Self-pay

## 2020-09-13 NOTE — Telephone Encounter (Addendum)
   Patient Name: [name]  DOB: Oct 01, 1950 MRN: [id number]  Primary Cardiologist: Lesleigh Noe, MD  Chart reviewed as part of pre-operative protocol coverage. Patient was just recently admitted to the hospital with new onset atrial fibrillation in the context of viral illness/pericarditis with pericardial effusion. Medical history otherwise as outlined. Patient has f/u OV scheduled 10/03/20. Will route to callback to find out from requesting party's office if procedure is urgent or can be deferred until after follow-up appt with our office. If the latter is the case, would add pre-procedure modifier to his upcoming appt with [name]. If it is more urgent we may need to involve MD in finalized clearance before seeing back in follow-up. Thanks.  Laurann Montana, PA-C 09/13/2020, 3:23 PM

## 2020-09-13 NOTE — Telephone Encounter (Signed)
   [facility] Pre-operative Risk Assessment    Patient Name: [name]  DOB: September 24, 1950 MRN: [id number]  HEARTCARE STAFF:  - IMPORTANT!!!!!! Under Visit Info/Reason for Call, type in Other and utilize the format Clearance MM/DD/YY or Clearance TBD. Do not use dashes or single digits. - Please review there is not already an duplicate clearance open for this procedure. - If request is for dental extraction, please clarify the # of teeth to be extracted. - If the patient is currently at the dentist's office, call Pre-Op Callback Staff (MA/nurse) to input urgent request.  - If the patient is not currently in the dentist office, please route to the Pre-Op pool.  Request for surgical clearance:  What type of surgery is being performed? Diagnostic Angiogram  When is this surgery scheduled? 09/27/2020  What type of clearance is required (medical clearance vs. Pharmacy clearance to hold med vs. Both)? Pharmacy  Are there any medications that need to be held prior to surgery and how long? Eliquis x 48 hours  Practice name and name of physician performing surgery? [facility] - Luanne Bras, MD  What is the office phone number? [phone]   7.   What is the office fax number? [phone]  8.   Anesthesia type (None, local, MAC, general) ? Not listed   [name] 09/13/2020, 7:53 AM  _________________________________________________________________   (provider comments below)

## 2020-09-13 NOTE — Telephone Encounter (Signed)
Left message for surgery scheduler for [name], MD in regards to if procedure is of urgent nature.   See message from pre op provider today:  Chart reviewed as part of pre-operative protocol coverage. Patient was just recently admitted to the hospital with new onset atrial fibrillation in the context of viral illness/pericarditis with pericardial effusion. Medical history otherwise as outlined. Patient has f/u OV scheduled 10/03/20. Will route to callback to find out from requesting party's office if procedure is urgent or can be deferred until after follow-up appt with our office. If the latter is the case, would add pre-procedure modifier to his upcoming appt with [name]. If it is more urgent we may need to involve MD in finalized clearance before seeing back in follow-up. Thanks.   Laurann Montana, PA-C 09/13/2020, 3:23 PM    I have left message to please call the office back as to inform if ok to wait until the appt 10/03/20 with cardiologist to be assessed for clearance or if this is of an urgent nature. I will fax these notes to surgeon's office as FYI.

## 2020-09-17 NOTE — Telephone Encounter (Signed)
Have not heard back from surgeon's office yet. I will fax these notes again in regards to urgency of procedure. See notes from pre op provider Ronie Spies, PAC. Pt has appt with Dr. Graciela Husbands 10/03/20.

## 2020-09-19 ENCOUNTER — Telehealth (HOSPITAL_COMMUNITY): Payer: Self-pay | Admitting: Radiology

## 2020-09-19 NOTE — Telephone Encounter (Signed)
Left a detailed message for [facility] @ Dr. Fatima Sanger office to call back.

## 2020-09-19 NOTE — Telephone Encounter (Signed)
Returned call to [place] in heart care office. Left message that ok for [name] to wait. [name] has already cancelled his cerebral angiogram. JM

## 2020-09-20 NOTE — Telephone Encounter (Signed)
Left a voice message for [name] asking to give the preop callback pool a call back to confirm if note was received.

## 2020-09-21 NOTE — Telephone Encounter (Signed)
Left message to call back regarding pre-op clearance  Sent instant message for scheduler Darletta Moll D to call me to discuss

## 2020-09-21 NOTE — Telephone Encounter (Signed)
Patient with diagnosis of A fib w/ history of CVA on Eliquis for anticoagulation.    Procedure: Diagnostic Angiogram Date of procedure: Sometime after 8/31   CHA2DS2-VASc Score = 7  This indicates a 11.2% annual risk of stroke. The patient's score is based upon: CHF History: Yes HTN History: Yes Diabetes History: Yes Stroke History: Yes Vascular Disease History: Yes Age Score: 1 Gender Score: 0  CrCl 59 mL/min Platelet count 401K  Patient at high risk off anticoagulation with history of A Fib and stroke and elevated risk score.   Patient will need provider approval for anticoagulation hold.  Has not seen a cardiologist at [facility]. Has hospital f/u with Herma Carson on 8/31.

## 2020-09-21 NOTE — Telephone Encounter (Signed)
Victorino Dike called back and states that she will call pt and schedule after her appt with [place] 10-03-20. Apologizes for the inconvenience. She states that she thought that he scheduler called and they thought Victorino Dike called.

## 2020-09-24 NOTE — Telephone Encounter (Signed)
I am covering preop inbox today. As noted below, surgical team is OK with postponing study until after cardiology follow-up. PharmD is working on anticoagulation as below. Pt has appointment with Raymond Reedy PA-C on 10/03/20 - preop clearance is already mentioned in appt notes. Per office protocol, the provider should assess clearance at time of office visit and should forward their finalized clearance decision to requesting party below. I will remove this message from the pre-op box.

## 2020-09-25 ENCOUNTER — Telehealth (HOSPITAL_COMMUNITY): Payer: Self-pay | Admitting: Cardiology

## 2020-09-25 NOTE — Telephone Encounter (Signed)
Patient was seen by our team in the hospital. This will be FYI only for [facility] to have available at time of OV, so no need to route back to preop pool when anticoag clearance is complete - thank you!

## 2020-09-25 NOTE — Telephone Encounter (Signed)
Patient cancelled echocardiogram and has now transferred his care the to the [facility] in [facility]. Order will be cancelled from the echo WQ. Thank you.

## 2020-09-27 ENCOUNTER — Encounter (HOSPITAL_COMMUNITY): Payer: Self-pay

## 2020-09-27 ENCOUNTER — Ambulatory Visit (HOSPITAL_COMMUNITY): Payer: Medicare HMO

## 2020-09-28 ENCOUNTER — Other Ambulatory Visit (HOSPITAL_COMMUNITY): Payer: Medicare HMO

## 2020-10-01 ENCOUNTER — Ambulatory Visit: Payer: Medicare HMO | Admitting: Adult Health

## 2020-10-03 ENCOUNTER — Ambulatory Visit: Payer: Medicare HMO | Admitting: Physician Assistant

## 2020-11-02 ENCOUNTER — Other Ambulatory Visit (HOSPITAL_COMMUNITY): Payer: Medicare HMO

## 2020-11-14 ENCOUNTER — Telehealth (HOSPITAL_COMMUNITY): Payer: Self-pay

## 2020-11-14 NOTE — Telephone Encounter (Signed)
Called to see about rescheduling angiogram, no answer, left vm. AW

## 2021-09-26 NOTE — Progress Notes (Signed)
[facility] [address] [place]. [place] [address] [phone]       OFFICE FOLLOW UP VISIT NOTE  [name] Date of Birth:  03-15-1950 Medical Record Number:  [id number]   Primary neurologist: Dr. Pearlean Brownie Referring MD: Marvel Plan Reason for Referral: Stroke  Chief Complaint  Patient presents with   follow up stroke    Pt is doing good. No major questions or concerns. Room 2 with wife      HPI:   Initial visit 01/05/2020 Dr. Karle Barr. Pulse is a 71 year old pleasant Caucasian male seen today for initial office consultation visit for stroke.  He is accompanied by his wife.  History is obtained from them, review of electronic medical records and I personally reviewed pertinent imaging films in PACS.  [name] has past medical history of hyperlipidemia, myocardial infarction and prior stroke with residual dysarthria and right-sided vision difficulties.  He was admitted to [facility] on 08/26/2019 for sudden onset of dysarthria.  He was visiting [place] and was taken to an ER there but patient declined MRI and admission.  He returned back to [facility] and saw his primary care physician who ordered an outpatient MRI scan which showed bilateral occipital and left anterior pontine infarcts as well as remote bilateral occipital, cerebellar and dorsal left midbrain as well as left centrum semiovale, bilateral thalamic left frontal white matter infarcts.  Patient was previously admitted in April 2021 with multiple posterior circulation strokes involving cerebellum and occipital lobes and left thalamus and midbrain at that time CT angiogram in June 2021 had shown severe bilateral vertebrobasilar junction stenosis with advanced intracranial atherosclerotic changes elsewhere as well.  Patient has had residual blurred vision in the right since his stroke in April with rest and gait imbalance slurred speech.  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  He underwent diagnostic cerebral catheter angiogram by Dr. 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  2D echo showed ejection fraction of 45 to 50%.  LDL cholesterol 55 mg percent hemoglobin A1c was 6.2.  Patient was started on aspirin and Brilinta.  Patient subsequently underwent elective angioplasty stenting of the near occlusive left vertebrobasilar junction on 09/01/2019 successfully.  He is remained on aspirin and Brilinta tolerating them well without bruising or bleeding.  He still has persistent right-sided vision loss and some gait imbalance and slurred speech but overall seems to be improving.  States his blood pressure is under good control today it is borderline in office at 143/77.  Is tolerating Lipitor well without muscle aches and pains.  He is able to ambulate short distances without assistance but uses cane for long distances.  He has an upcoming follow-up visit with Dr. Corliss Skains to look for stent patency. Update 04/09/2020 Dr. Pearlean Brownie: He returns for follow-up after last visit 3 months ago.  Is accompanied by his wife today.  He is doing well has had no recurrent stroke or TIA symptoms.  His speech is improved but occasionally some words are slurred but he [name] is tired.  He still has not obtained any significant improvement in his peripheral vision which remains poor on the right side.  Patient has stopped Brilinta and is now on aspirin alone and is tolerating it well without significant bruising or  bleeding.  Remains on Lipitor which is tolerating well without muscle aches and pains.  His blood pressure is well controlled today it is slightly borderline 148/87 in office today.  He has no new complaints.    Update 09/30/2021 JM: Patient returns for  stroke follow-up after prior visit over 1 year ago in 04/2020.  He is accompanied by his wife  He has not had any new stroke/TIA symptoms.  Reports residual occasional slurred speech and visual loss stable since prior visit.  he was diagnosed with atrial fibrillation back in 08/2020 and was started on Eliquis 5 mg twice daily.  He is closely followed by cardiology. Also remains on atorvastatin, denies side effects Blood pressure 136/76 - monitors at home and has been stable  Wife reports previously scheduled cerebral angiogram with IR put on hold due to cardiac issues last year.  She questions need of rescheduling this procedure.  No further concerns at this time.     ROS:   14 system review of systems is positive for those listed in HPI and all other systems negative  PMH:  Past Medical History:  Diagnosis Date   Diabetes mellitus type 2, diet-controlled ([place])    HLD (hyperlipidemia)    MI (myocardial infarction) ([place]) 1990   at [facility]   Stroke [facility]) 2021   x 4, initially at [facility], others at [facility]; then came to [facility] w/ ?CVA >> blockage found and fixed    Social History:  Social History   Socioeconomic History   Marital status: Married    Spouse name: [name]   Number of children: Not on file   Years of education: Not on file   Highest education level: Not on file  Occupational History   Not on file  Tobacco Use   Smoking status: Never   Smokeless tobacco: Never  Substance and Sexual Activity   Alcohol use: Not Currently   Drug use: Never   Sexual activity: Not on file  Other Topics Concern   Not on file  Social History Narrative   Lives with wife Kay   Right Handed   Drinks 1-2 cups caffeine   Social Determinants of Health   Financial Resource Strain: Not on file  Food Insecurity: Not on file  Transportation Needs: Not on file  Physical Activity: Not on file  Stress: Not on file  Social Connections: Not on file  Intimate Partner Violence: Not  on file    Medications:   Current Outpatient Medications on File Prior to Visit  Medication Sig Dispense Refill   apixaban (ELIQUIS) 5 MG TABS tablet Take 1 tablet (5 mg total) by mouth 2 (two) times daily. 60 tablet 1   aspirin EC 81 MG tablet Take 81 mg by mouth daily. Swallow whole.     atorvastatin (LIPITOR) 80 MG tablet Take 80 mg by mouth daily at 6 PM.      carvedilol (COREG) 6.25 MG tablet Take 0.5 tablets (3.125 mg total) by mouth 2 (two) times daily. 30 tablet 0   colchicine 0.6 MG tablet Take 1 tablet (0.6 mg total) by mouth 2 (two) times daily. 30 tablet 2   dapagliflozin propanediol (FARXIGA) 10 MG TABS tablet Take 1 tablet (10 mg total) by mouth daily. 30 tablet 1   FLUoxetine (PROZAC) 10 MG capsule Take 10 mg by mouth daily.     furosemide (LASIX) 20 MG tablet Take 1 tablet (20 mg total) by mouth daily. 30 tablet 0   Misc  Natural Products (TART CHERRY ADVANCED PO) Take 1,200 mg by mouth daily.     Multiple Vitamins-Minerals (ONE-A-DAY MENS 50+ PO) Take 2 tablets by mouth daily.     polyethylene glycol (MIRALAX / GLYCOLAX) 17 g packet Take 17 g by mouth daily as needed for moderate constipation. 14 each 0   No current facility-administered medications on file prior to visit.    Allergies:  No Known Allergies  Physical Exam Today's Vitals   09/30/21 1034  BP: 136/76  Pulse: 75  Weight: 204 lb 8 oz (92.8 kg)  Height: 5\' 10"  (1.778 m)   Body mass index is 29.34 kg/m.   General: well developed, well nourished pleasant middle-age Caucasian male, seated, in no evident distress Head: head normocephalic and atraumatic.   Neck: supple with no carotid or supraclavicular bruits Cardiovascular: regular rate and rhythm, no murmurs Musculoskeletal: no deformity Skin:  no rash/petichiae Vascular:  Normal pulses all extremities  Neurologic Exam Mental Status: Awake and fully alert.  Speech slow but unable to appreciate dysarthria or aphasia.  Oriented to place and time.  Recent and remote memory intact. Attention span, concentration and fund of knowledge appropriate. Mood and affect appropriate.  Cranial Nerves: Pupils equal, briskly reactive to light. Extraocular movements full without nystagmus. Visual fields show dense right homonymous hemianopsia to confrontation. Hearing intact. Facial sensation intact.  Mild right nasolabial fold asymmetry.  Tongue, palate moves normally and symmetrically.  Motor: Normal bulk and tone. Normal strength in all tested extremity muscles. Sensory.: intact to touch , pinprick , position and vibratory sensation.  Coordination: Rapid alternating movements normal in all extremities. Finger-to-nose and heel-to-shin performed accurately bilaterally. Gait and Station: Arises from chair without difficulty. Stance is normal. Gait demonstrates slow cautious steps with mild imbalance without use of AD.  Tandem walking heel toe not attempted.   Reflexes: 1+ and symmetric. Toes downgoing.        ASSESSMENT/PLAN: 71 year old Caucasian male with left pontine and bilateral occipital lobe infarcts in July 2021 secondary to symptomatic left vertebral basilar junction stenosis treated with successful angioplasty and stenting.  Vascular risk factors of atrial fibrillation on Eliquis 08/2020, prior stroke, hypertension, hyperlipidemia, DM, CAD s/p PCI of LAD and L circumflex, ischemic cardiomyopathy and occlusive intracranial atherosclerotic disease.  He is stable from neurovascular standpoint but has persistent dense right homonymous hemianopsia.    -Continue to follow with ophthalmology, request peripheral visual field test at follow-up visit -Continue Eliquis and atorvastatin for secondary stroke prevention and per cardiology recommendations  -Continue close PCP and cardiology follow-up for aggressive stroke risk factor management  including BP goal<130/90, HLD with LDL goal<70 and DM with A1c.<7  -will reach out to IR to assist with rescheduling  f/u with Dr. 09/2020 as previously cerebral angio placed on hold in setting of cardiac procedures per wife   Overall stable from stroke standpoint without further recommendations and risk factors are managed by PCP. He may follow up PRN, as usual for our patients who are strictly being followed for stroke. If any new neurological issues should arise, request PCP place referral for evaluation by one of our neurologists. Thank you.      CC:  Corliss Skains, PA-C   I spent 31 minutes of face-to-face and non-face-to-face time with patient and wife.  This included previsit chart review, lab review, study review, order entry, electronic health record documentation, patient and wife education and treatment options and answered all other questions to patient wife satisfaction  Katherine Basset, AGNP-BC  [facility] [address] [place], [place] [address]  Phone [phone] Fax [phone] Note: This document was prepared with digital dictation and possible smart phrase technology. Any transcriptional errors that result from this process are unintentional.

## 2021-09-30 ENCOUNTER — Ambulatory Visit: Payer: Medicare HMO | Admitting: Adult Health

## 2021-09-30 ENCOUNTER — Encounter: Payer: Self-pay | Admitting: Adult Health

## 2021-09-30 VITALS — BP 136/76 | HR 75 | Ht 70.0 in | Wt 204.5 lb

## 2021-09-30 DIAGNOSIS — I639 Cerebral infarction, unspecified: Secondary | ICD-10-CM

## 2021-09-30 NOTE — Patient Instructions (Signed)
Continue  Eliquis   and atorvastatin  for secondary stroke prevention and atrial fibrillation  Will send a message to Dr. Corliss Skains interventional radiology regarding previously recommended procedure and need of scheduling   Contiue to follow with ophthalmology for routine monitoring. Request completion of peripheral visual field test at next visit  Continue to follow up with PCP regarding cholesterol and blood pressure management  Maintain strict control of hypertension with blood pressure goal below 130/90, diabetes with hemoglobin A1c goal below 7.0 % and cholesterol with LDL cholesterol (bad cholesterol) goal below 70 mg/dL.   Signs of a Stroke? Follow the BEFAST method:  Balance Watch for a sudden loss of balance, trouble with coordination or vertigo Eyes Is there a sudden loss of vision in one or both eyes? Or double vision?  Face: Ask the person to smile. Does one side of the face droop or is it numb?  Arms: Ask the person to raise both arms. Does one arm drift downward? Is there weakness or numbness of a leg? Speech: Ask the person to repeat a simple phrase. Does the speech sound slurred/strange? Is the person confused ? Time: If you observe any of these signs, call 911.        Thank you for coming to see [place] at [facility]. I hope we have been able to provide you high quality care today.  You may receive a patient satisfaction survey over the next few weeks. We would appreciate your feedback and comments so that we may continue to improve ourselves and the health of our patients.

## 2021-10-01 ENCOUNTER — Other Ambulatory Visit (HOSPITAL_COMMUNITY): Payer: Self-pay | Admitting: Interventional Radiology

## 2021-10-01 DIAGNOSIS — I639 Cerebral infarction, unspecified: Secondary | ICD-10-CM

## 2021-10-04 ENCOUNTER — Telehealth: Payer: Self-pay | Admitting: *Deleted

## 2021-10-04 NOTE — Telephone Encounter (Signed)
   Pre-operative Risk Assessment    Patient Name: [name]  DOB: 1950-11-27 MRN: [id number]      Request for Surgical Clearance    Procedure:   DIAGNOSTIC ANGIOGRAM  Date of Surgery:  Clearance 10/17/21                                 Surgeon:  DR. SANJEEV DEVESHWAR Surgeon's Group or Practice Name:  [facility] Phone number:  [phone] Fax number:  [phone]   Type of Clearance Requested:   - Medical  - Pharmacy:  Hold Apixaban (Eliquis)     Type of Anesthesia:   MODERATE SEDATION   Additional requests/questions:    Elpidio Anis   10/04/2021, 2:19 PM

## 2021-10-04 NOTE — Telephone Encounter (Signed)
Please inform the requesting provider that the patient is being followed by Dr. Sedalia Muta of [facility] cardiovascular service.  He is new to [facility].  Please send the request to his primary cardiologist.

## 2021-10-08 NOTE — Telephone Encounter (Signed)
Left a message for Morrie Sheldon at [facility] about contacting patient's primary cardiologist office per pre-op team.

## 2021-10-16 ENCOUNTER — Other Ambulatory Visit: Payer: Self-pay | Admitting: Internal Medicine

## 2021-10-16 DIAGNOSIS — I6322 Cerebral infarction due to unspecified occlusion or stenosis of basilar arteries: Secondary | ICD-10-CM

## 2021-10-17 ENCOUNTER — Ambulatory Visit (HOSPITAL_COMMUNITY)
Admission: RE | Admit: 2021-10-17 | Discharge: 2021-10-17 | Disposition: A | Discharge status: Home / Self Care | Payer: Medicare HMO | Source: Ambulatory Visit | Attending: Interventional Radiology | Admitting: Interventional Radiology

## 2021-10-17 ENCOUNTER — Other Ambulatory Visit: Payer: Self-pay

## 2021-10-17 ENCOUNTER — Other Ambulatory Visit: Payer: Self-pay | Admitting: Radiology

## 2021-10-17 ENCOUNTER — Other Ambulatory Visit (HOSPITAL_COMMUNITY): Payer: Self-pay | Admitting: Interventional Radiology

## 2021-10-17 DIAGNOSIS — I6322 Cerebral infarction due to unspecified occlusion or stenosis of basilar arteries: Secondary | ICD-10-CM

## 2021-10-17 DIAGNOSIS — I639 Cerebral infarction, unspecified: Secondary | ICD-10-CM

## 2021-10-17 DIAGNOSIS — Z7901 Long term (current) use of anticoagulants: Secondary | ICD-10-CM | POA: Diagnosis not present

## 2021-10-17 HISTORY — PX: IR US GUIDE VASC ACCESS RIGHT: IMG2390

## 2021-10-17 HISTORY — PX: IR ANGIO VERTEBRAL SEL VERTEBRAL UNI L MOD SED: IMG5367

## 2021-10-17 LAB — CBC WITH DIFFERENTIAL/PLATELET
Abs Immature Granulocytes: 0.02 10*3/uL (ref 0.00–0.07)
Basophils Absolute: 0 10*3/uL (ref 0.0–0.1)
Basophils Relative: 0 %
Eosinophils Absolute: 0.6 10*3/uL — ABNORMAL HIGH (ref 0.0–0.5)
Eosinophils Relative: 8 %
HCT: 43.9 % (ref 39.0–52.0)
Hemoglobin: 15.6 g/dL (ref 13.0–17.0)
Immature Granulocytes: 0 %
Lymphocytes Relative: 18 %
Lymphs Abs: 1.4 10*3/uL (ref 0.7–4.0)
MCH: 35.1 pg — ABNORMAL HIGH (ref 26.0–34.0)
MCHC: 35.5 g/dL (ref 30.0–36.0)
MCV: 98.7 fL (ref 80.0–100.0)
Monocytes Absolute: 0.8 10*3/uL (ref 0.1–1.0)
Monocytes Relative: 11 %
Neutro Abs: 4.7 10*3/uL (ref 1.7–7.7)
Neutrophils Relative %: 63 %
Platelets: 226 10*3/uL (ref 150–400)
RBC: 4.45 MIL/uL (ref 4.22–5.81)
RDW: 12.4 % (ref 11.5–15.5)
WBC: 7.5 10*3/uL (ref 4.0–10.5)
nRBC: 0 % (ref 0.0–0.2)

## 2021-10-17 LAB — BASIC METABOLIC PANEL
Anion gap: 11 (ref 5–15)
BUN: 21 mg/dL (ref 8–23)
CO2: 23 mmol/L (ref 22–32)
Calcium: 8.9 mg/dL (ref 8.9–10.3)
Chloride: 107 mmol/L (ref 98–111)
Creatinine, Ser: 1.7 mg/dL — ABNORMAL HIGH (ref 0.61–1.24)
GFR, Estimated: 43 mL/min — ABNORMAL LOW (ref >60)
Glucose, Bld: 123 mg/dL — ABNORMAL HIGH (ref 70–99)
Potassium: 3.8 mmol/L (ref 3.5–5.1)
Sodium: 141 mmol/L (ref 135–145)

## 2021-10-17 LAB — PROTIME-INR
INR: 1.1 (ref 0.8–1.2)
Prothrombin Time: 13.9 seconds (ref 11.4–15.2)

## 2021-10-17 MED ORDER — LIDOCAINE HCL 1 % IJ SOLN
INTRAMUSCULAR | Status: AC
  Filled 2021-10-17: qty 20

## 2021-10-17 MED ORDER — MIDAZOLAM HCL 2 MG/2ML IJ SOLN
INTRAMUSCULAR | Status: AC
  Filled 2021-10-17: qty 2

## 2021-10-17 MED ORDER — HEPARIN SODIUM (PORCINE) 1000 UNIT/ML IJ SOLN
INTRAMUSCULAR | Status: AC | PRN
  Administered 2021-10-17: 2000 [IU] via INTRAVENOUS

## 2021-10-17 MED ORDER — HYDROCODONE-ACETAMINOPHEN 5-325 MG PO TABS
1.0000 | ORAL_TABLET | Freq: Once | ORAL | Status: AC
  Administered 2021-10-17: 1 via ORAL

## 2021-10-17 MED ORDER — FENTANYL CITRATE (PF) 100 MCG/2ML IJ SOLN
INTRAMUSCULAR | Status: AC | PRN
  Administered 2021-10-17: 12.5 ug via INTRAVENOUS

## 2021-10-17 MED ORDER — FENTANYL CITRATE (PF) 100 MCG/2ML IJ SOLN
INTRAMUSCULAR | Status: AC
  Filled 2021-10-17: qty 2

## 2021-10-17 MED ORDER — SODIUM CHLORIDE 0.9 % IV SOLN: INTRAVENOUS | Status: AC

## 2021-10-17 MED ORDER — SODIUM CHLORIDE 0.9 % IV SOLN: INTRAVENOUS | Status: DC

## 2021-10-17 MED ORDER — LIDOCAINE HCL 1 % IJ SOLN
INTRAMUSCULAR | Status: AC | PRN
  Administered 2021-10-17: 10 mL via INTRADERMAL

## 2021-10-17 MED ORDER — NITROGLYCERIN 1 MG/10 ML FOR IR/CATH LAB
INTRA_ARTERIAL | Status: AC | PRN
  Administered 2021-10-17 (×2): 200 ug via INTRA_ARTERIAL

## 2021-10-17 MED ORDER — IOHEXOL 300 MG/ML  SOLN
150.0000 mL | Freq: Once | INTRAMUSCULAR | Status: AC | PRN
  Administered 2021-10-17: 75 mL via INTRA_ARTERIAL

## 2021-10-17 MED ORDER — MIDAZOLAM HCL 2 MG/2ML IJ SOLN
INTRAMUSCULAR | Status: AC | PRN
  Administered 2021-10-17: .5 mg via INTRAVENOUS

## 2021-10-17 MED ORDER — HYDROCODONE-ACETAMINOPHEN 5-325 MG PO TABS
ORAL_TABLET | ORAL | Status: AC
  Filled 2021-10-17: qty 1

## 2021-10-17 MED ORDER — HEPARIN SODIUM (PORCINE) 1000 UNIT/ML IJ SOLN
INTRAMUSCULAR | Status: AC
  Filled 2021-10-17: qty 10

## 2021-10-17 MED ORDER — NITROGLYCERIN 1 MG/10 ML FOR IR/CATH LAB
INTRA_ARTERIAL | Status: AC
  Filled 2021-10-17: qty 10

## 2021-10-17 NOTE — Progress Notes (Signed)
Patient up to ambulate. No bleeding noted.

## 2021-10-17 NOTE — H&P (Signed)
Chief Complaint: Patient was seen in consultation today for cerebral angiogram at the request of [place]   Supervising Physician: Luanne Bras  Patient Status: [facility] - Out-pt  History of Present Illness: [name] is a 71 y.o. 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 His last angiogram was 2021 showing stability of this stenosis. He continues to take Eliquis.   Scheduled for cerebral angiogram today for follow up.  PMHx, meds, labs, imaging, allergies reviewed. Feels well, no recent fevers, chills, illness. Has been NPO today as directed.   Past Medical History:  Diagnosis Date   Diabetes mellitus type 2, diet-controlled ([place])    HLD (hyperlipidemia)    MI (myocardial infarction) ([place]) 1990   at [facility]   Stroke [facility]) 2021   x 4, initially at [facility], others at [facility]; then came to [facility] w/ ?CVA >> blockage found and fixed    Past Surgical History:  Procedure Laterality Date   CORONARY STENT INTERVENTION     IR ANGIO INTRA EXTRACRAN SEL COM CAROTID INNOMINATE BILAT MOD SED  08/29/2019   IR ANGIO VERTEBRAL SEL SUBCLAVIAN INNOMINATE UNI R MOD SED  08/29/2019   IR ANGIO VERTEBRAL SEL VERTEBRAL UNI L MOD SED  08/29/2019   IR INTRA CRAN STENT  09/01/2019   IR US GUIDE VASC ACCESS RIGHT  08/29/2019   RADIOLOGY WITH ANESTHESIA N/A 09/01/2019   Procedure: IR WITH ANESTHESIA BASILAR ARTERY STENTING;  Surgeon: Luanne Bras, MD;  Location: [place];  Service: Radiology;  Laterality: N/A;    Allergies: Allopurinol  Medications: Prior to Admission medications   Medication Sig Start Date End Date Taking? Authorizing Provider  acetaminophen (TYLENOL) 325 MG tablet Take 650 mg by mouth every 6 (six) hours as needed for moderate pain.   Yes [provider]  apixaban (ELIQUIS) 5 MG TABS tablet Take 1 tablet (5 mg total) by mouth 2 (two) times daily. 09/04/20   Yes Aslam, Loralyn Freshwater, MD  atorvastatin (LIPITOR) 80 MG tablet Take 80 mg by mouth daily at 6 PM.  08/17/19  Yes [provider]  carvedilol (COREG) 3.125 MG tablet Take 3.125 mg by mouth 2 (two) times daily. 08/30/21  Yes [provider]  cyclobenzaprine (FLEXERIL) 10 MG tablet Take 10 mg by mouth 2 (two) times daily as needed for muscle spasms. 09/11/21  Yes [provider]  FEROSUL 325 (65 Fe) MG tablet Take 325 mg by mouth daily. 07/01/21  Yes [provider]  FLUoxetine (PROZAC) 10 MG capsule Take 10 mg by mouth daily. 08/17/19  Yes [provider]  furosemide (LASIX) 20 MG tablet Take 1 tablet (20 mg total) by mouth daily. 09/06/20 10/14/21 Yes Aslam, Loralyn Freshwater, MD  HYDROcodone-acetaminophen (NORCO/VICODIN) 5-325 MG tablet Take 1 tablet by mouth every 12 (twelve) hours as needed for pain. 09/11/21  Yes [provider]  lisinopril (ZESTRIL) 5 MG tablet Take 2.5 mg by mouth daily. 08/01/21  Yes [provider]  Misc Natural Products (TART CHERRY ADVANCED PO) Take 1 tablet by mouth daily.   Yes [provider]  Multiple Vitamins-Minerals (ONE-A-DAY MENS 50+ PO) Take 2 tablets by mouth daily.   Yes [provider]  colchicine 0.6 MG tablet Take 1 tablet (0.6 mg total) by mouth 2 (two) times daily. Patient taking differently: Take 0.6 mg by mouth 2 (two) times daily as needed (gout flare). 09/04/20   Harvie Heck, MD     Family History  Problem  Relation Age of Onset   Heart disease Father     Social History   Socioeconomic History   Marital status: Married    Spouse name: [name]   Number of children: Not on file   Years of education: Not on file   Highest education level: Not on file  Occupational History   Not on file  Tobacco Use   Smoking status: Never   Smokeless tobacco: Never  Substance and Sexual Activity   Alcohol use: Not Currently   Drug use: Never   Sexual activity: Not on file  Other Topics Concern   Not on file   Social History Narrative   Lives with wife [name]   Right Handed   Drinks 1-2 cups caffeine   Social Determinants of Health   Financial Resource Strain: Not on file  Food Insecurity: Not on file  Transportation Needs: Not on file  Physical Activity: Not on file  Stress: Not on file  Social Connections: Not on file    Review of Systems: A 12 point ROS discussed and pertinent positives are indicated in the HPI above.  All other systems are negative.  Review of Systems  Vital Signs: BP 125/77   Pulse 70   Temp 97.9 F (36.6 C)   Resp 16   Ht 5\' 10"  (1.778 m)   Wt 200 lb (90.7 kg)   SpO2 94%   BMI 28.70 kg/m    Physical Exam Constitutional:      Appearance: He is not ill-appearing.  Cardiovascular:     Rate and Rhythm: Normal rate and regular rhythm.     Heart sounds: Normal heart sounds.     Comments: Palpable femoral pulses. Pedal AT pulses with doppler only. Pulmonary:     Effort: Pulmonary effort is normal. No respiratory distress.     Breath sounds: Normal breath sounds.  Neurological:     General: No focal deficit present.     Mental Status: He is alert and oriented to person, place, and time.  Psychiatric:        Mood and Affect: Mood normal.        Thought Content: Thought content normal.        Judgment: Judgment normal.     Imaging: No results found.  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 No intervention planned. Labs reviewed. Cr elevated at 1.7.  Has been as high as 1.5. No known hx of CKD  Risks and benefits of  cerebral angio were discussed with the patient including, but not limited to bleeding, infection, vascular injury or contrast induced renal failure.  This interventional procedure involves the use of X-rays and because of the nature of the planned procedure, it is possible that we will have prolonged use of X-ray fluoroscopy.  Potential radiation risks to you include (but are not limited to) the following: - A slightly elevated risk for cancer  several years later in life. This risk is typically  less than 0.5% percent. This risk is low in comparison to the normal incidence of human cancer, which is 33% for women and 50% for men according to the [employer] Society. - Radiation induced injury can include skin redness, resembling a rash, tissue breakdown / ulcers and hair loss (which can be temporary or permanent).   The likelihood of either of these occurring depends on the difficulty of the procedure and whether you are sensitive to radiation due to previous procedures, disease, or genetic conditions.   IF your procedure requires a prolonged use of radiation, you will be notified and given written instructions for further action.  It is your responsibility to monitor the irradiated area for the 2 weeks following the procedure and to notify your physician if you are concerned that you have suffered a radiation induced injury.    All of the patient's questions were answered, patient is agreeable to proceed.  Consent signed and in chart.   Thank you for this interesting consult.  I greatly enjoyed meeting [name] and look forward to participating in their care.  A copy of this report was sent to the requesting provider on this date.  Electronically Signed: Brayton El, PA-C 10/17/2021, 10:05 AM   I spent a total of 20 minutes in face to face in clinical consultation, greater than 50% of which was counseling/coordinating care for cerebral angiogram

## 2021-10-17 NOTE — Procedures (Signed)
INR. Status post left vertebral arteriogram. Right CFA approach. Initial right radial approach abandoned due to inability to access left vertebral artery. Findings . 1.  Wide patency of the previously positioned stent at the left vertebral basilar junction distal to the left posterior inferior cerebellar artery.  Fatima Sanger MD.

## 2022-05-01 ENCOUNTER — Telehealth (HOSPITAL_COMMUNITY): Payer: Self-pay

## 2022-05-01 NOTE — Telephone Encounter (Signed)
Called to schedule mra, no answer, left vm. AB  

## 2023-08-01 ENCOUNTER — Encounter (HOSPITAL_COMMUNITY): Payer: Self-pay | Admitting: Interventional Radiology
